# Patient Record
Sex: Male | Born: 1935 | Race: White | Hispanic: No | Marital: Married | Smoking: Former smoker
Health system: Southern US, Community
[De-identification: ages and names within clinical notes are randomized; demographics above are authoritative.]

## PROBLEM LIST (undated history)

## (undated) ENCOUNTER — Inpatient Hospital Stay: Payer: Self-pay | Admitting: Physician Assistant

## (undated) DIAGNOSIS — Z8669 Personal history of other diseases of the nervous system and sense organs: Secondary | ICD-10-CM

## (undated) DIAGNOSIS — N400 Enlarged prostate without lower urinary tract symptoms: Secondary | ICD-10-CM

## (undated) DIAGNOSIS — F419 Anxiety disorder, unspecified: Secondary | ICD-10-CM

## (undated) DIAGNOSIS — F32A Depression, unspecified: Secondary | ICD-10-CM

## (undated) DIAGNOSIS — M1990 Unspecified osteoarthritis, unspecified site: Secondary | ICD-10-CM

## (undated) DIAGNOSIS — R519 Headache, unspecified: Secondary | ICD-10-CM

## (undated) DIAGNOSIS — C801 Malignant (primary) neoplasm, unspecified: Secondary | ICD-10-CM

## (undated) DIAGNOSIS — R51 Headache: Secondary | ICD-10-CM

## (undated) DIAGNOSIS — Z973 Presence of spectacles and contact lenses: Secondary | ICD-10-CM

## (undated) DIAGNOSIS — K219 Gastro-esophageal reflux disease without esophagitis: Secondary | ICD-10-CM

## (undated) DIAGNOSIS — E785 Hyperlipidemia, unspecified: Secondary | ICD-10-CM

## (undated) DIAGNOSIS — Z8551 Personal history of malignant neoplasm of bladder: Secondary | ICD-10-CM

## (undated) DIAGNOSIS — Z8719 Personal history of other diseases of the digestive system: Secondary | ICD-10-CM

## (undated) DIAGNOSIS — Z87442 Personal history of urinary calculi: Secondary | ICD-10-CM

## (undated) DIAGNOSIS — M48061 Spinal stenosis, lumbar region without neurogenic claudication: Secondary | ICD-10-CM

## (undated) DIAGNOSIS — G8929 Other chronic pain: Secondary | ICD-10-CM

## (undated) DIAGNOSIS — E291 Testicular hypofunction: Secondary | ICD-10-CM

## (undated) DIAGNOSIS — M549 Dorsalgia, unspecified: Secondary | ICD-10-CM

## (undated) DIAGNOSIS — N200 Calculus of kidney: Secondary | ICD-10-CM

## (undated) DIAGNOSIS — D49519 Neoplasm of unspecified behavior of unspecified kidney: Secondary | ICD-10-CM

## (undated) DIAGNOSIS — F329 Major depressive disorder, single episode, unspecified: Secondary | ICD-10-CM

## (undated) DIAGNOSIS — I10 Essential (primary) hypertension: Secondary | ICD-10-CM

## (undated) DIAGNOSIS — F429 Obsessive-compulsive disorder, unspecified: Secondary | ICD-10-CM

## (undated) HISTORY — PX: COLONOSCOPY: SHX174

## (undated) HISTORY — DX: Depression, unspecified: F32.A

## (undated) HISTORY — DX: Major depressive disorder, single episode, unspecified: F32.9

## (undated) HISTORY — PX: BACK SURGERY: SHX140

## (undated) HISTORY — PX: JOINT REPLACEMENT: SHX530

## (undated) HISTORY — DX: Gastro-esophageal reflux disease without esophagitis: K21.9

## (undated) HISTORY — PX: EYE SURGERY: SHX253

## (undated) HISTORY — PX: INGUINAL HERNIA REPAIR: SUR1180

## (undated) HISTORY — DX: Anxiety disorder, unspecified: F41.9

## (undated) HISTORY — PX: CATARACT EXTRACTION W/ INTRAOCULAR LENS  IMPLANT, BILATERAL: SHX1307

## (undated) HISTORY — PX: TONSILLECTOMY: SUR1361

---

## 2001-11-11 ENCOUNTER — Ambulatory Visit (HOSPITAL_COMMUNITY): Admission: RE | Admit: 2001-11-11 | Discharge: 2001-11-11 | Payer: Self-pay | Admitting: Gastroenterology

## 2005-03-27 ENCOUNTER — Encounter: Admission: RE | Admit: 2005-03-27 | Discharge: 2005-03-27 | Payer: Self-pay | Admitting: Sports Medicine

## 2010-12-05 ENCOUNTER — Other Ambulatory Visit: Payer: Self-pay | Admitting: Physician Assistant

## 2010-12-05 ENCOUNTER — Encounter: Payer: Self-pay | Admitting: Physician Assistant

## 2010-12-05 ENCOUNTER — Other Ambulatory Visit: Payer: Self-pay | Admitting: Orthopedic Surgery

## 2010-12-05 DIAGNOSIS — E7800 Pure hypercholesterolemia, unspecified: Secondary | ICD-10-CM | POA: Insufficient documentation

## 2010-12-05 DIAGNOSIS — H3323 Serous retinal detachment, bilateral: Secondary | ICD-10-CM | POA: Insufficient documentation

## 2010-12-05 DIAGNOSIS — M1710 Unilateral primary osteoarthritis, unspecified knee: Secondary | ICD-10-CM | POA: Insufficient documentation

## 2010-12-05 DIAGNOSIS — M818 Other osteoporosis without current pathological fracture: Secondary | ICD-10-CM | POA: Insufficient documentation

## 2010-12-05 DIAGNOSIS — F419 Anxiety disorder, unspecified: Secondary | ICD-10-CM | POA: Insufficient documentation

## 2010-12-05 NOTE — H&P (Signed)
Timothy Osborn is an 77 y.o. male.   Chief Complaint: left knee pain, end stage DJD HPI: 89 yowm presents today with left knee pain that has been going on for years.  He has tried and failed cortisone shots, supartz shots, NSAIDs, and pain meds.  We discussed the risks benefits and possible complications of a left total knee replacement.  He understands and is without question.  Past Medical History  Diagnosis Date  . Depression   . GERD (gastroesophageal reflux disease)   . Anxiety   . Hypercholesterolemia     Past Surgical History  Procedure Date  . Hernia repair     Family History  Problem Relation Age of Onset  . Heart failure Father   . Heart attack Brother    Social History:  reports that he quit smoking about 24 years ago. He does not have any smokeless tobacco history on file. He reports that he drinks about 3.6 ounces of alcohol per week. He reports that he does not use illicit drugs.  Allergies: No Known Allergies  No current outpatient prescriptions on file as of 12/05/2010.   No current facility-administered medications on file as of 12/05/2010.    No results found for this or any previous visit (from the past 48 hour(s)). No results found.  Review of Systems  Constitutional: Negative for fever, chills, weight loss, malaise/fatigue and diaphoresis.  HENT: Negative for hearing loss, ear pain, nosebleeds, congestion, sore throat, neck pain, tinnitus and ear discharge.   Eyes: Negative for blurred vision, double vision, photophobia, pain, discharge and redness.  Respiratory: Negative for cough, hemoptysis, sputum production, shortness of breath, wheezing and stridor.   Cardiovascular: Negative for chest pain, palpitations, orthopnea, claudication, leg swelling and PND.  Gastrointestinal: Positive for heartburn. Negative for nausea, vomiting, abdominal pain, diarrhea, constipation, blood in stool and melena.  Genitourinary: Negative for dysuria, urgency, frequency,  hematuria and flank pain.  Musculoskeletal: Positive for joint pain. Negative for myalgias, back pain and falls.  Skin: Positive for itching. Negative for rash.  Neurological: Negative for dizziness, tingling, tremors, sensory change, speech change, focal weakness, seizures, loss of consciousness, weakness and headaches.  Endo/Heme/Allergies: Negative for environmental allergies and polydipsia. Bruises/bleeds easily.  Psychiatric/Behavioral: Positive for depression. Negative for suicidal ideas, hallucinations, memory loss and substance abuse. The patient is nervous/anxious and has insomnia.   All other systems reviewed and are negative.    There were no vitals taken for this visit. Physical Exam  Constitutional: He is oriented to person, place, and time. He appears well-developed and well-nourished.  HENT:  Head: Normocephalic and atraumatic.  Mouth/Throat: Oropharynx is clear and moist.  Eyes: Conjunctivae and EOM are normal. Pupils are equal, round, and reactive to light.  Neck: Normal range of motion. Neck supple.  Cardiovascular: Normal rate, regular rhythm, normal heart sounds and intact distal pulses.   Respiratory: Effort normal and breath sounds normal. He has no wheezes. He has no rales. He exhibits no tenderness.  GI: Soft. Bowel sounds are normal. He exhibits no distension. There is no tenderness.  Musculoskeletal:       Left knee   0-120 degrees 2+crepitus 2+synovitis no effusion ligamentously stable normal patella tracking  Neurological: He is alert and oriented to person, place, and time. He has normal reflexes.  Skin: Skin is warm and dry. No rash noted. No erythema.  Psychiatric: He has a normal mood and affect. His behavior is normal. Judgment and thought content normal.     Assessment/Plan:  End stage DJD left knee Anxiety Hypercholesterolemia Osteoporosis GERD  Plan on a Left total Knee replacement by Dr Thurston Hole 12/18/2010 at 10 am   Stephanny Tsutsui  J 12/05/2010, 4:08 PM

## 2010-12-06 ENCOUNTER — Encounter: Payer: Self-pay | Admitting: Physician Assistant

## 2010-12-06 ENCOUNTER — Other Ambulatory Visit: Payer: Self-pay | Admitting: Physician Assistant

## 2010-12-06 NOTE — H&P (Signed)
Timothy Osborn is an 76 y.o. male.   Chief Complaint: left knee end stage DJD HPI: 77 year old white male with a many year history of left knee pain.  He has tried and failed multiple cortisone shots, viscosupplementation shots, NSAIDs, and pain medication.  Past Medical History  Diagnosis Date  . Depression   . GERD (gastroesophageal reflux disease)   . Anxiety   . Hypercholesterolemia   . Detached retina, left 2012    cleared in July by Dr Dione Booze  . Detached retina, right 2008    Past Surgical History  Procedure Date  . Hernia repair     Family History  Problem Relation Age of Onset  . Heart failure Father   . Heart attack Brother    Social History:  reports that he quit smoking about 24 years ago. He does not have any smokeless tobacco history on file. He reports that he drinks about 3.6 ounces of alcohol per week. He reports that he does not use illicit drugs.  Allergies: No Known Allergies  Medications Prior to Admission  Medication Sig Dispense Refill  . aspirin 81 MG tablet Take 81 mg by mouth daily.        . calcium-vitamin D (OSCAL WITH D) 500-200 MG-UNIT per tablet Take 2 tablets by mouth daily.        Marland Kitchen co-enzyme Q-10 30 MG capsule Take 30 mg by mouth daily.        . multivitamin-iron-minerals-folic acid (CENTRUM) chewable tablet Chew 1 tablet by mouth daily.        Marland Kitchen omeprazole (PRILOSEC) 40 MG capsule Take 40 mg by mouth daily.        Marland Kitchen PARoxetine (PAXIL) 40 MG tablet Take 40 mg by mouth every morning.        . piroxicam (FELDENE) 10 MG capsule Take 10 mg by mouth daily.        . simvastatin (ZOCOR) 20 MG tablet Take 20 mg by mouth at bedtime.        . Thiamine HCl (VITAMIN B-1) 100 MG tablet Take 100 mg by mouth daily.         No current facility-administered medications on file as of 12/06/2010.    No results found for this or any previous visit (from the past 48 hour(s)). No results found.  Review of Systems  Constitutional: Negative.   HENT: Negative.   Negative for neck pain.   Eyes: Negative.   Respiratory: Negative.   Cardiovascular: Negative.   Gastrointestinal: Positive for heartburn. Negative for nausea, vomiting, abdominal pain, diarrhea, constipation, blood in stool and melena.  Genitourinary: Negative.   Musculoskeletal: Positive for joint pain. Negative for myalgias, back pain and falls.       Left knee pain  Skin: Positive for itching. Negative for rash.  Neurological: Negative.   Endo/Heme/Allergies: Negative for environmental allergies and polydipsia. Bruises/bleeds easily.  Psychiatric/Behavioral: Positive for depression. Negative for suicidal ideas, hallucinations, memory loss and substance abuse. The patient is nervous/anxious and has insomnia.     Blood pressure 156/79, pulse 85, temperature 97.8 F (36.6 C), resp. rate 12, height 5\' 11"  (1.803 m), weight 91.173 kg (201 lb), SpO2 93.00%.  Physical Exam  Constitutional: He is oriented to person, place, and time. He appears well-developed and well-nourished.  HENT:  Head: Normocephalic and atraumatic.  Nose: Nose normal.  Mouth/Throat: Oropharynx is clear and moist.  Eyes: Conjunctivae and EOM are normal. Pupils are equal, round, and reactive to light.  Neck:  Normal range of motion. Neck supple. No JVD present.  Cardiovascular: Normal rate, regular rhythm, normal heart sounds and intact distal pulses.   Respiratory: Effort normal and breath sounds normal.  GI: Soft. Bowel sounds are normal.  Musculoskeletal:       Left knee ROM-5 to 120 degrees 2+crepitus 2+synovitis.  ligamentously stable, normal patella tracking   Right knee has full range of motion 1+ crepitus, 1+synovitis and minimal pain today  Neurological: He is alert and oriented to person, place, and time. He has normal reflexes.  Skin: Skin is warm and dry.  Psychiatric: He has a normal mood and affect. His behavior is normal. Judgment and thought content normal.     Assessment Patient Active Problem List   Diagnoses  . Hypercholesterolemia  . Osteoporosis, idiopathic  . DJD (degenerative joint disease) of knee  . Detached retina, bilateral  . Anxiety   Plan: The risks, benefits, and possible complications of a left total knee replacement was discussed in detail with the patient.  The patient is without question.  Left total knee replacement by Dr Thurston Hole on 12/18/2010 at 10 am.  Admit to [facility] post operatively.  Krishav Mamone A. Gwinda Passe Physician Assistant [facility] [phone]  12/06/2010, 1:05 PM Pascal Lux 12/06/2010, 12:39 PM

## 2010-12-08 ENCOUNTER — Encounter (HOSPITAL_COMMUNITY): Payer: Self-pay | Admitting: Pharmacy Technician

## 2010-12-12 ENCOUNTER — Other Ambulatory Visit: Payer: Self-pay

## 2010-12-12 ENCOUNTER — Ambulatory Visit (HOSPITAL_COMMUNITY)
Admission: RE | Admit: 2010-12-12 | Discharge: 2010-12-12 | Disposition: A | Discharge status: Home / Self Care | Payer: Medicare Other | Source: Ambulatory Visit | Attending: Physician Assistant | Admitting: Physician Assistant

## 2010-12-12 ENCOUNTER — Encounter (HOSPITAL_COMMUNITY)
Admission: RE | Admit: 2010-12-12 | Discharge: 2010-12-12 | Disposition: A | Discharge status: Home / Self Care | Payer: Medicare Other | Source: Ambulatory Visit | Attending: Orthopedic Surgery | Admitting: Orthopedic Surgery

## 2010-12-12 ENCOUNTER — Other Ambulatory Visit (HOSPITAL_COMMUNITY): Payer: Self-pay | Admitting: Physician Assistant

## 2010-12-12 ENCOUNTER — Encounter (HOSPITAL_COMMUNITY): Payer: Self-pay

## 2010-12-12 DIAGNOSIS — Z01811 Encounter for preprocedural respiratory examination: Secondary | ICD-10-CM

## 2010-12-12 DIAGNOSIS — M25569 Pain in unspecified knee: Secondary | ICD-10-CM | POA: Insufficient documentation

## 2010-12-12 DIAGNOSIS — Z01812 Encounter for preprocedural laboratory examination: Secondary | ICD-10-CM | POA: Insufficient documentation

## 2010-12-12 DIAGNOSIS — Z01818 Encounter for other preprocedural examination: Secondary | ICD-10-CM | POA: Insufficient documentation

## 2010-12-12 DIAGNOSIS — Z01810 Encounter for preprocedural cardiovascular examination: Secondary | ICD-10-CM | POA: Insufficient documentation

## 2010-12-12 HISTORY — DX: Obsessive-compulsive disorder, unspecified: F42.9

## 2010-12-12 HISTORY — DX: Unspecified osteoarthritis, unspecified site: M19.90

## 2010-12-12 LAB — URINALYSIS, ROUTINE W REFLEX MICROSCOPIC
Glucose, UA: NEGATIVE mg/dL
Ketones, ur: NEGATIVE mg/dL
Protein, ur: NEGATIVE mg/dL
pH: 5.5 (ref 5.0–8.0)

## 2010-12-12 LAB — CBC
HCT: 45.7 % (ref 39.0–52.0)
MCV: 94.4 fL (ref 78.0–100.0)
RDW: 13.1 % (ref 11.5–15.5)
WBC: 7.7 10*3/uL (ref 4.0–10.5)

## 2010-12-12 LAB — COMPREHENSIVE METABOLIC PANEL
Albumin: 3.4 g/dL — ABNORMAL LOW (ref 3.5–5.2)
BUN: 17 mg/dL (ref 6–23)
CO2: 27 mEq/L (ref 19–32)
Chloride: 106 mEq/L (ref 96–112)
Creatinine, Ser: 0.94 mg/dL (ref 0.50–1.35)
GFR calc Af Amer: 90 mL/min — ABNORMAL LOW (ref >90)
GFR calc non Af Amer: 77 mL/min — ABNORMAL LOW (ref >90)
Glucose, Bld: 90 mg/dL (ref 70–99)
Total Bilirubin: 0.4 mg/dL (ref 0.3–1.2)

## 2010-12-12 LAB — DIFFERENTIAL
Basophils Absolute: 0 10*3/uL (ref 0.0–0.1)
Basophils Relative: 1 % (ref 0–1)
Neutro Abs: 5 10*3/uL (ref 1.7–7.7)
Neutrophils Relative %: 65 % (ref 43–77)

## 2010-12-12 LAB — URINE MICROSCOPIC-ADD ON

## 2010-12-12 LAB — TYPE AND SCREEN: ABO/RH(D): O POS

## 2010-12-12 LAB — PROTIME-INR: INR: 0.96 (ref 0.00–1.49)

## 2010-12-12 NOTE — Pre-Procedure Instructions (Signed)
[name]  12/12/2010   Your procedure is scheduled on: Monday, November 19th                                                           Report to [facility] at 7:45 Am  Call this number if you have problems the morning of surgery: [phone]   Remember:   Do not eat food:After Midnight.  Do not drink clear liquids: 4 Hours before arrival  (3:45 Am...may have             Tea, soda, black coffee, apple or grape juice, broth .  Take these medicines the morning of surgery with A SIP OF WATER: Omeprazole &              Paxil  Do not wear jewelry, make-up or nail polish.   Do not wear lotions, powders, or perfumes. You may wear deodorant.   Do not shave 48 hours prior to surgery.              DO NOT BRING VALUABLES--RINGS, WATCHES, NECKLACES,             CELL PHONES, MONEY TO THE HOSPITAL   Contacts, dentures or bridgework may not be worn into surgery.  Leave suitcase in the car. After surgery it may be brought to your room.   For patients admitted to the hospital, checkout time is 11:00 AM the day of discharge.   Patients discharged the day of surgery will not be allowed to drive home.  Name and phone number of your driver:  Vernon Maish -- spouse  [phone]                                 Special Instructions: CHG Shower Use Special Wash: 1/2 bottle night before surgery and 1/2 bottle morning of surgery.   Please read over the following fact sheets that you were given: Pain Booklet, MRSA Information and Surgical Site Infection Prevention

## 2010-12-12 NOTE — Pre-Procedure Instructions (Deleted)
20 PURL CLAYTOR  12/12/2010   Your procedure is scheduled on: November 19th  Monday   Report to [facility] at    7:45  AM .  Call this number if you have problems the morning of surgery: [phone]   Remember:   Do not eat food:After Midnight. Sunday  Do not drink clear liquids: 4 Hours before arrival 3:45 Am.   Take these medicines the morning of surgery with A SIP OF WATER: Omeprazole             And Paxil   Do not wear jewelry, make-up or nail polish.   Do not wear lotions, powders, or perfumes. You may wear deodorant.   Do not shave 48 hours prior to surgery.   Do not bring valuables to the hospital, no watches, rings, necklaces,             Money or cell phones   Contacts, dentures or bridgework may not be worn into surgery.   Leave suitcase in the car. After surgery it may be brought to your room.  For patients admitted to the hospital, checkout time is 11:00 AM the day of discharge.   Patients discharged the day of surgery will not be allowed to drive home.  Name and phone number of your driver: Jamy Cleckler -- spouse  [phone]                Special Instructions: CHG Shower Use Special Wash: 1/2 bottle night before surgery and 1/2 bottle morning of surgery.   Please read over the following fact sheets that you were given: Pain Booklet, MRSA Information and Surgical Site Infection Prevention

## 2010-12-12 NOTE — Progress Notes (Signed)
PATIENT, WHEN HE HAD LEFT HERNIA REPAIR (IN 1952-53), HE WAS HOSPITALIZED FOR 5-6 DAYS D/T  URINARY RETENTION......

## 2010-12-13 LAB — URINE CULTURE
Colony Count: NO GROWTH
Culture  Setup Time: 201211131225
Culture: NO GROWTH

## 2010-12-14 NOTE — Consult Note (Signed)
Anesthesia:  77 year old male for left TKA.  Hx of GERD, anxiety, OCD, former smoker (quit '88), depression, and hyperlipidemia.  I was asked to review his preoperative EKG showing LAD and possible prior septal infarct.  He has no known hx of DM or CAD.  I spoke with Mr. Hofferber.  He denies CP, SOB, and edema.  He continues to play the saxaphone.  His activity is somewhat limited secondary to knee pain, but he is still able to walk his dog 1/2 mile daily without CV symptoms.  Many years ago the patient had been told that he had a murmur, so he did undergo an echo 4-5 years ago to further evaluate.  He was told that it was okay.  This was done by his previous PCP.  He now sees Dr. Nehemiah Settle.  I did fax a request for his last OV, prior EKG, and echo report if available.  He is asymptomatic and with reasonable exercise tolerance, so at this point I feel he should be okay to proceed.

## 2010-12-14 NOTE — Progress Notes (Signed)
Chart to Shonna Chock PA to review EKG.

## 2010-12-15 NOTE — Consult Note (Signed)
Anesthesia:  See my previous consult note.  I did receive records from Timothy Osborn office.  Timothy Osborn did have an echocardiogram in 2005 showing normal LV systolic function with EF 65%, trivial MR/TR, and LA at upper limits of normal.  He had an EKG done at Timothy Osborn office in 09/06/10 showing NSR.  V1 appears similar to 12/12/10 EKG, but low r in V2-3 is new.  As per my last note, the patient has reasonable exercise tolerance and is asymptomatic.  I also discussed this with Timothy Osborn who still felt comfortable clearing Timothy Osborn for surgery, but did recommend repeating the EKG on arrival to see if lead placement may be a factor.  Updated Timothy Osborn who agrees with this plan.

## 2010-12-17 MED ORDER — CEFAZOLIN SODIUM-DEXTROSE 2-3 GM-% IV SOLR
2.0000 g | INTRAVENOUS | Status: AC
  Administered 2010-12-18: 2 g via INTRAVENOUS
  Filled 2010-12-17: qty 50

## 2010-12-18 ENCOUNTER — Encounter (HOSPITAL_COMMUNITY): Payer: Self-pay | Admitting: *Deleted

## 2010-12-18 ENCOUNTER — Encounter (HOSPITAL_COMMUNITY): Payer: Self-pay | Admitting: Vascular Surgery

## 2010-12-18 ENCOUNTER — Inpatient Hospital Stay (HOSPITAL_COMMUNITY)
Admission: RE | Admit: 2010-12-18 | Discharge: 2010-12-20 | DRG: 470 | Disposition: A | Discharge status: Home Health | Payer: Medicare Other | Source: Ambulatory Visit | Attending: Orthopedic Surgery | Admitting: Orthopedic Surgery

## 2010-12-18 ENCOUNTER — Encounter (HOSPITAL_COMMUNITY)
Admission: RE | Disposition: A | Discharge status: Home Health | Payer: Self-pay | Source: Ambulatory Visit | Attending: Orthopedic Surgery

## 2010-12-18 ENCOUNTER — Other Ambulatory Visit: Payer: Self-pay

## 2010-12-18 ENCOUNTER — Inpatient Hospital Stay (HOSPITAL_COMMUNITY): Payer: Medicare Other | Admitting: Vascular Surgery

## 2010-12-18 DIAGNOSIS — K219 Gastro-esophageal reflux disease without esophagitis: Secondary | ICD-10-CM | POA: Diagnosis present

## 2010-12-18 DIAGNOSIS — F411 Generalized anxiety disorder: Secondary | ICD-10-CM | POA: Diagnosis present

## 2010-12-18 DIAGNOSIS — M810 Age-related osteoporosis without current pathological fracture: Secondary | ICD-10-CM | POA: Diagnosis present

## 2010-12-18 DIAGNOSIS — E7800 Pure hypercholesterolemia, unspecified: Secondary | ICD-10-CM | POA: Diagnosis present

## 2010-12-18 DIAGNOSIS — F419 Anxiety disorder, unspecified: Secondary | ICD-10-CM | POA: Insufficient documentation

## 2010-12-18 DIAGNOSIS — D62 Acute posthemorrhagic anemia: Secondary | ICD-10-CM | POA: Diagnosis not present

## 2010-12-18 DIAGNOSIS — G4700 Insomnia, unspecified: Secondary | ICD-10-CM | POA: Diagnosis present

## 2010-12-18 DIAGNOSIS — M1710 Unilateral primary osteoarthritis, unspecified knee: Principal | ICD-10-CM | POA: Diagnosis present

## 2010-12-18 DIAGNOSIS — Z23 Encounter for immunization: Secondary | ICD-10-CM

## 2010-12-18 DIAGNOSIS — Z79899 Other long term (current) drug therapy: Secondary | ICD-10-CM

## 2010-12-18 HISTORY — PX: TOTAL KNEE ARTHROPLASTY: SHX125

## 2010-12-18 SURGERY — ARTHROPLASTY, KNEE, TOTAL
Anesthesia: Regional | Site: Knee | Laterality: Left | Wound class: Clean

## 2010-12-18 MED ORDER — ONDANSETRON HCL 4 MG PO TABS: 4.0000 mg | ORAL_TABLET | Freq: Four times a day (QID) | ORAL | Status: DC | PRN

## 2010-12-18 MED ORDER — THERA M PLUS PO TABS
1.0000 | ORAL_TABLET | Freq: Every day | ORAL | Status: DC
  Administered 2010-12-19 – 2010-12-20 (×2): 1 via ORAL
  Filled 2010-12-18 (×2): qty 1

## 2010-12-18 MED ORDER — FLEET ENEMA 7-19 GM/118ML RE ENEM: 1.0000 | ENEMA | Freq: Every day | RECTAL | Status: DC | PRN

## 2010-12-18 MED ORDER — ENOXAPARIN SODIUM 30 MG/0.3ML ~~LOC~~ SOLN
30.0000 mg | Freq: Two times a day (BID) | SUBCUTANEOUS | Status: DC
  Administered 2010-12-18 – 2010-12-20 (×4): 30 mg via SUBCUTANEOUS
  Filled 2010-12-18 (×7): qty 0.3

## 2010-12-18 MED ORDER — CEFAZOLIN SODIUM-DEXTROSE 2-3 GM-% IV SOLR
2.0000 g | Freq: Four times a day (QID) | INTRAVENOUS | Status: AC
  Administered 2010-12-18 – 2010-12-19 (×3): 2 g via INTRAVENOUS
  Filled 2010-12-18 (×4): qty 50

## 2010-12-18 MED ORDER — ONDANSETRON HCL 4 MG/2ML IJ SOLN
INTRAMUSCULAR | Status: DC | PRN
  Administered 2010-12-18: 4 mg via INTRAVENOUS

## 2010-12-18 MED ORDER — MEPERIDINE HCL 25 MG/ML IJ SOLN: 6.2500 mg | INTRAMUSCULAR | Status: DC | PRN

## 2010-12-18 MED ORDER — BISACODYL 5 MG PO TBEC: 10.0000 mg | DELAYED_RELEASE_TABLET | Freq: Every day | ORAL | Status: DC | PRN

## 2010-12-18 MED ORDER — HYDROCODONE-ACETAMINOPHEN 5-325 MG PO TABS
1.0000 | ORAL_TABLET | ORAL | Status: DC | PRN
  Administered 2010-12-18 – 2010-12-19 (×3): 2 via ORAL
  Administered 2010-12-19: 1 via ORAL
  Administered 2010-12-19: 2 via ORAL
  Administered 2010-12-20: 1 via ORAL
  Administered 2010-12-20: 2 via ORAL
  Administered 2010-12-20: 1 via ORAL
  Filled 2010-12-18: qty 1
  Filled 2010-12-18: qty 2
  Filled 2010-12-18: qty 1
  Filled 2010-12-18 (×2): qty 2
  Filled 2010-12-18: qty 1
  Filled 2010-12-18 (×2): qty 2

## 2010-12-18 MED ORDER — ZOLPIDEM TARTRATE 5 MG PO TABS: 5.0000 mg | ORAL_TABLET | Freq: Every evening | ORAL | Status: DC | PRN

## 2010-12-18 MED ORDER — PAROXETINE HCL 20 MG PO TABS
40.0000 mg | ORAL_TABLET | ORAL | Status: DC
  Administered 2010-12-19 – 2010-12-20 (×2): 40 mg via ORAL
  Filled 2010-12-18 (×3): qty 2

## 2010-12-18 MED ORDER — LACTATED RINGERS IV SOLN: INTRAVENOUS | Status: DC

## 2010-12-18 MED ORDER — MENTHOL 3 MG MT LOZG: 1.0000 | LOZENGE | OROMUCOSAL | Status: DC | PRN

## 2010-12-18 MED ORDER — METOCLOPRAMIDE HCL 10 MG PO TABS: 5.0000 mg | ORAL_TABLET | Freq: Three times a day (TID) | ORAL | Status: DC | PRN

## 2010-12-18 MED ORDER — CELECOXIB 200 MG PO CAPS
200.0000 mg | ORAL_CAPSULE | Freq: Every day | ORAL | Status: DC
  Administered 2010-12-19 – 2010-12-20 (×2): 200 mg via ORAL
  Filled 2010-12-18 (×2): qty 1

## 2010-12-18 MED ORDER — SIMVASTATIN 20 MG PO TABS
20.0000 mg | ORAL_TABLET | Freq: Every day | ORAL | Status: DC
  Administered 2010-12-18 – 2010-12-19 (×2): 20 mg via ORAL
  Filled 2010-12-18 (×3): qty 1

## 2010-12-18 MED ORDER — MIDAZOLAM HCL 2 MG/2ML IJ SOLN: 1.0000 mg | INTRAMUSCULAR | Status: DC | PRN

## 2010-12-18 MED ORDER — MIDAZOLAM HCL 5 MG/5ML IJ SOLN
INTRAMUSCULAR | Status: DC | PRN
  Administered 2010-12-18: 2 mg via INTRAVENOUS

## 2010-12-18 MED ORDER — POLYETHYLENE GLYCOL 3350 17 G PO PACK
17.0000 g | PACK | Freq: Every day | ORAL | Status: DC | PRN
  Filled 2010-12-18: qty 1

## 2010-12-18 MED ORDER — BUPIVACAINE-EPINEPHRINE PF 0.5-1:200000 % IJ SOLN
INTRAMUSCULAR | Status: DC | PRN
  Administered 2010-12-18: 30 mL

## 2010-12-18 MED ORDER — MAGNESIUM HYDROXIDE 400 MG/5ML PO SUSP
30.0000 mL | Freq: Two times a day (BID) | ORAL | Status: DC | PRN
  Filled 2010-12-18: qty 30

## 2010-12-18 MED ORDER — ONDANSETRON HCL 4 MG/2ML IJ SOLN: 4.0000 mg | Freq: Four times a day (QID) | INTRAMUSCULAR | Status: DC | PRN

## 2010-12-18 MED ORDER — LIDOCAINE HCL (CARDIAC) 20 MG/ML IV SOLN
INTRAVENOUS | Status: DC | PRN
  Administered 2010-12-18: 100 mg via INTRAVENOUS

## 2010-12-18 MED ORDER — PANTOPRAZOLE SODIUM 40 MG PO TBEC
80.0000 mg | DELAYED_RELEASE_TABLET | Freq: Every day | ORAL | Status: DC
  Administered 2010-12-18 – 2010-12-20 (×3): 80 mg via ORAL
  Filled 2010-12-18: qty 2
  Filled 2010-12-18: qty 1
  Filled 2010-12-18: qty 2
  Filled 2010-12-18: qty 1
  Filled 2010-12-18: qty 2

## 2010-12-18 MED ORDER — FENTANYL CITRATE 0.05 MG/ML IJ SOLN
INTRAMUSCULAR | Status: DC | PRN
  Administered 2010-12-18 (×4): 50 ug via INTRAVENOUS
  Administered 2010-12-18: 100 ug via INTRAVENOUS
  Administered 2010-12-18: 25 ug via INTRAVENOUS

## 2010-12-18 MED ORDER — CEFUROXIME SODIUM 1.5 G IJ SOLR
INTRAMUSCULAR | Status: DC | PRN
  Administered 2010-12-18: 1.5 g

## 2010-12-18 MED ORDER — HYDROMORPHONE HCL PF 1 MG/ML IJ SOLN
0.5000 mg | INTRAMUSCULAR | Status: DC | PRN
Start: 2010-12-18 — End: 2010-12-20
  Administered 2010-12-19: 1 mg via INTRAVENOUS
  Filled 2010-12-18: qty 1

## 2010-12-18 MED ORDER — DIPHENHYDRAMINE HCL 12.5 MG/5ML PO ELIX
12.5000 mg | ORAL_SOLUTION | ORAL | Status: DC | PRN
  Filled 2010-12-18: qty 10

## 2010-12-18 MED ORDER — FENTANYL CITRATE 0.05 MG/ML IJ SOLN: 50.0000 ug | INTRAMUSCULAR | Status: DC | PRN

## 2010-12-18 MED ORDER — BISACODYL 10 MG RE SUPP
10.0000 mg | Freq: Every day | RECTAL | Status: DC | PRN
  Administered 2010-12-20: 10 mg via RECTAL
  Filled 2010-12-18: qty 1

## 2010-12-18 MED ORDER — SODIUM CHLORIDE 0.9 % IR SOLN
Status: DC | PRN
  Administered 2010-12-18: 1000 mL
  Administered 2010-12-18: 3000 mL

## 2010-12-18 MED ORDER — METOCLOPRAMIDE HCL 5 MG/ML IJ SOLN
5.0000 mg | Freq: Three times a day (TID) | INTRAMUSCULAR | Status: DC | PRN
  Filled 2010-12-18: qty 2

## 2010-12-18 MED ORDER — PROPOFOL 10 MG/ML IV EMUL
INTRAVENOUS | Status: DC | PRN
  Administered 2010-12-18: 100 mg via INTRAVENOUS

## 2010-12-18 MED ORDER — HYDROMORPHONE HCL PF 1 MG/ML IJ SOLN: 0.2500 mg | INTRAMUSCULAR | Status: DC | PRN

## 2010-12-18 MED ORDER — EPHEDRINE SULFATE 50 MG/ML IJ SOLN
INTRAMUSCULAR | Status: DC | PRN
  Administered 2010-12-18 (×2): 10 mg via INTRAVENOUS

## 2010-12-18 MED ORDER — ACETAMINOPHEN 10 MG/ML IV SOLN
INTRAVENOUS | Status: DC | PRN
  Administered 2010-12-18: 1000 mg via INTRAVENOUS

## 2010-12-18 MED ORDER — OXYCODONE HCL 5 MG PO TABS: 5.0000 mg | ORAL_TABLET | ORAL | Status: DC | PRN

## 2010-12-18 MED ORDER — PHENOL 1.4 % MT LIQD
1.0000 | OROMUCOSAL | Status: DC | PRN
  Filled 2010-12-18: qty 177

## 2010-12-18 MED ORDER — LACTATED RINGERS IV SOLN
INTRAVENOUS | Status: DC
  Administered 2010-12-18: 09:00:00 via INTRAVENOUS

## 2010-12-18 MED ORDER — LACTATED RINGERS IV SOLN
INTRAVENOUS | Status: DC | PRN
  Administered 2010-12-18 (×2): via INTRAVENOUS

## 2010-12-18 MED ORDER — ONDANSETRON HCL 4 MG/2ML IJ SOLN: 4.0000 mg | Freq: Once | INTRAMUSCULAR | Status: DC | PRN

## 2010-12-18 MED ORDER — POTASSIUM CHLORIDE IN NACL 20-0.9 MEQ/L-% IV SOLN
INTRAVENOUS | Status: DC
  Administered 2010-12-18 – 2010-12-19 (×2): via INTRAVENOUS
  Filled 2010-12-18 (×6): qty 1000

## 2010-12-18 MED ORDER — VITAMIN B-1 100 MG PO TABS
100.0000 mg | ORAL_TABLET | Freq: Every day | ORAL | Status: DC
  Administered 2010-12-19 – 2010-12-20 (×2): 100 mg via ORAL
  Filled 2010-12-18 (×2): qty 1

## 2010-12-18 MED ORDER — DOCUSATE SODIUM 100 MG PO CAPS
100.0000 mg | ORAL_CAPSULE | Freq: Two times a day (BID) | ORAL | Status: DC
  Administered 2010-12-18 – 2010-12-20 (×4): 100 mg via ORAL
  Filled 2010-12-18 (×5): qty 1

## 2010-12-18 MED ORDER — ACETAMINOPHEN 650 MG RE SUPP: 650.0000 mg | Freq: Four times a day (QID) | RECTAL | Status: DC | PRN

## 2010-12-18 MED ORDER — CALCIUM CARBONATE-VITAMIN D 500-200 MG-UNIT PO TABS
2.0000 | ORAL_TABLET | Freq: Every day | ORAL | Status: DC
  Administered 2010-12-19 – 2010-12-20 (×2): via ORAL
  Filled 2010-12-18 (×2): qty 2

## 2010-12-18 MED ORDER — ACETAMINOPHEN 325 MG PO TABS: 650.0000 mg | ORAL_TABLET | Freq: Four times a day (QID) | ORAL | Status: DC | PRN

## 2010-12-18 MED ORDER — OXYCODONE-ACETAMINOPHEN 5-325 MG PO TABS: 1.0000 | ORAL_TABLET | ORAL | Status: DC | PRN

## 2010-12-18 SURGICAL SUPPLY — 80 items
AUTOTRANSFUSION W/QD PVC DRAIN (AUTOTRANSFUSION) IMPLANT
BANDAGE ESMARK 6X9 LF (GAUZE/BANDAGES/DRESSINGS) ×1 IMPLANT
BLADE SAGITTAL 25.0X1.19X90 (BLADE) ×2 IMPLANT
BLADE SAW SGTL 11.0X1.19X90.0M (BLADE) IMPLANT
BLADE SAW SGTL 13.0X1.19X90.0M (BLADE) ×2 IMPLANT
BLADE SURG 10 STRL SS (BLADE) ×4 IMPLANT
BNDG CMPR 9X6 STRL LF SNTH (GAUZE/BANDAGES/DRESSINGS) ×1
BNDG CMPR MED 10X6 ELC LF (GAUZE/BANDAGES/DRESSINGS) ×1
BNDG CMPR MED 15X6 ELC VLCR LF (GAUZE/BANDAGES/DRESSINGS) ×1
BNDG ELASTIC 6X10 VLCR STRL LF (GAUZE/BANDAGES/DRESSINGS) ×1 IMPLANT
BNDG ELASTIC 6X15 VLCR STRL LF (GAUZE/BANDAGES/DRESSINGS) ×2 IMPLANT
BNDG ESMARK 6X9 LF (GAUZE/BANDAGES/DRESSINGS) ×2
BOWL SMART MIX CTS (DISPOSABLE) ×2 IMPLANT
CEMENT HV SMART SET (Cement) ×4 IMPLANT
CLOSURE STERI STRIP 1/2 X4 (GAUZE/BANDAGES/DRESSINGS) ×1 IMPLANT
CLOTH BEACON ORANGE TIMEOUT ST (SAFETY) ×2 IMPLANT
COVER BACK TABLE 24X17X13 BIG (DRAPES) IMPLANT
COVER PROBE W GEL 5X96 (DRAPES) ×2 IMPLANT
COVER SURGICAL LIGHT HANDLE (MISCELLANEOUS) ×2 IMPLANT
CUFF TOURNIQUET SINGLE 34IN LL (TOURNIQUET CUFF) ×2 IMPLANT
CUFF TOURNIQUET SINGLE 44IN (TOURNIQUET CUFF) IMPLANT
DRAPE EXTREMITY T 121X128X90 (DRAPE) ×2 IMPLANT
DRAPE INCISE IOBAN 66X45 STRL (DRAPES) ×2 IMPLANT
DRAPE PROXIMA HALF (DRAPES) ×2 IMPLANT
DRAPE U-SHAPE 47X51 STRL (DRAPES) ×2 IMPLANT
DRSG ADAPTIC 3X8 NADH LF (GAUZE/BANDAGES/DRESSINGS) ×2 IMPLANT
DRSG PAD ABDOMINAL 8X10 ST (GAUZE/BANDAGES/DRESSINGS) ×3 IMPLANT
DURAPREP 26ML APPLICATOR (WOUND CARE) ×2 IMPLANT
ELECT CAUTERY BLADE 6.4 (BLADE) ×2 IMPLANT
ELECT REM PT RETURN 9FT ADLT (ELECTROSURGICAL) ×2
ELECTRODE REM PT RTRN 9FT ADLT (ELECTROSURGICAL) ×1 IMPLANT
EVACUATOR 1/8 PVC DRAIN (DRAIN) ×1 IMPLANT
FACESHIELD LNG OPTICON STERILE (SAFETY) ×3 IMPLANT
GLOVE BIO SURGEON STRL SZ7 (GLOVE) ×2 IMPLANT
GLOVE BIOGEL PI IND STRL 7.0 (GLOVE) ×1 IMPLANT
GLOVE BIOGEL PI IND STRL 7.5 (GLOVE) ×1 IMPLANT
GLOVE BIOGEL PI INDICATOR 7.0 (GLOVE) ×1
GLOVE BIOGEL PI INDICATOR 7.5 (GLOVE) ×1
GLOVE BIOGEL PI ORTHO PRO SZ7 (GLOVE) ×1
GLOVE ECLIPSE 8.5 STRL (GLOVE) ×1 IMPLANT
GLOVE PI ORTHO PRO STRL SZ7 (GLOVE) IMPLANT
GLOVE SS BIOGEL STRL SZ 7.5 (GLOVE) ×1 IMPLANT
GLOVE SUPERSENSE BIOGEL SZ 7.5 (GLOVE) ×2
GLOVE SURG SS PI 7.0 STRL IVOR (GLOVE) ×1 IMPLANT
GLOVE SURG SS PI 8.5 STRL IVOR (GLOVE) ×1
GLOVE SURG SS PI 8.5 STRL STRW (GLOVE) IMPLANT
GOWN PREVENTION PLUS XLARGE (GOWN DISPOSABLE) ×4 IMPLANT
GOWN PREVENTION PLUS XXLARGE (GOWN DISPOSABLE) ×1 IMPLANT
GOWN STRL NON-REIN LRG LVL3 (GOWN DISPOSABLE) ×3 IMPLANT
GOWN STRL REIN XL XLG (GOWN DISPOSABLE) ×1 IMPLANT
HANDPIECE INTERPULSE COAX TIP (DISPOSABLE) ×2
HOOD PEEL AWAY FACE SHEILD DIS (HOOD) ×4 IMPLANT
IMMOBILIZER KNEE 22 UNIV (SOFTGOODS) ×1 IMPLANT
KIT BASIN OR (CUSTOM PROCEDURE TRAY) ×2 IMPLANT
KIT ROOM TURNOVER OR (KITS) ×2 IMPLANT
MANIFOLD NEPTUNE II (INSTRUMENTS) ×2 IMPLANT
NDL 18GX1X1/2 (RX/OR ONLY) (NEEDLE) ×1 IMPLANT
NEEDLE 18GX1X1/2 (RX/OR ONLY) (NEEDLE) ×2 IMPLANT
NS IRRIG 1000ML POUR BTL (IV SOLUTION) ×2 IMPLANT
PACK TOTAL JOINT (CUSTOM PROCEDURE TRAY) ×2 IMPLANT
PAD ARMBOARD 7.5X6 YLW CONV (MISCELLANEOUS) ×2 IMPLANT
PAD CAST 4YDX4 CTTN HI CHSV (CAST SUPPLIES) ×1 IMPLANT
PADDING CAST COTTON 4X4 STRL (CAST SUPPLIES) ×2
PADDING CAST COTTON 6X4 STRL (CAST SUPPLIES) ×2 IMPLANT
POSITIONER HEAD PRONE TRACH (MISCELLANEOUS) ×2 IMPLANT
SET HNDPC FAN SPRY TIP SCT (DISPOSABLE) ×1 IMPLANT
SPONGE GAUZE 4X4 12PLY (GAUZE/BANDAGES/DRESSINGS) ×2 IMPLANT
STRIP CLOSURE SKIN 1/2X4 (GAUZE/BANDAGES/DRESSINGS) ×2 IMPLANT
SUCTION FRAZIER TIP 10 FR DISP (SUCTIONS) ×2 IMPLANT
SUT ETHIBOND NAB CT1 #1 30IN (SUTURE) ×5 IMPLANT
SUT MNCRL AB 3-0 PS2 18 (SUTURE) ×3 IMPLANT
SUT VIC AB 0 CT1 27 (SUTURE) ×4
SUT VIC AB 0 CT1 27XBRD ANBCTR (SUTURE) ×2 IMPLANT
SUT VIC AB 2-0 CT1 27 (SUTURE) ×4
SUT VIC AB 2-0 CT1 TAPERPNT 27 (SUTURE) ×2 IMPLANT
SYR 30ML SLIP (SYRINGE) ×2 IMPLANT
TOWEL OR 17X24 6PK STRL BLUE (TOWEL DISPOSABLE) ×2 IMPLANT
TOWEL OR 17X26 10 PK STRL BLUE (TOWEL DISPOSABLE) ×2 IMPLANT
TRAY FOLEY CATH 14FR (SET/KITS/TRAYS/PACK) ×2 IMPLANT
WATER STERILE IRR 1000ML POUR (IV SOLUTION) ×6 IMPLANT

## 2010-12-18 NOTE — Plan of Care (Signed)
Problem: Consults Goal: Diagnosis- Total Joint Replacement Outcome: Completed/Met Date Met:  12/18/10 Primary Total Knee     

## 2010-12-18 NOTE — Anesthesia Preprocedure Evaluation (Addendum)
Anesthesia Evaluation  Patient identified by MRN, date of birth, ID band Patient awake    Reviewed: Allergy & Precautions, H&P , NPO status , Patient's Chart, lab work & pertinent test results  Airway       Dental   Pulmonary          Cardiovascular     Neuro/Psych PSYCHIATRIC DISORDERS Anxiety Depression    GI/Hepatic GERD-  Medicated and Controlled,  Endo/Other    Renal/GU      Musculoskeletal   Abdominal   Peds  Hematology   Anesthesia Other Findings   Reproductive/Obstetrics                          Anesthesia Physical Anesthesia Plan  ASA: II  Anesthesia Plan: General   Post-op Pain Management:    Induction: Intravenous  Airway Management Planned: LMA  Additional Equipment:   Intra-op Plan:   Post-operative Plan:   Informed Consent: I have reviewed the patients History and Physical, chart, labs and discussed the procedure including the risks, benefits and alternatives for the proposed anesthesia with the patient or authorized representative who has indicated his/her understanding and acceptance.   Dental advisory given  Plan Discussed with: CRNA and Surgeon  Anesthesia Plan Comments:        Anesthesia Quick Evaluation

## 2010-12-18 NOTE — Preoperative (Signed)
Beta Blockers   Reason not to administer Beta Blockers:Not Applicable 

## 2010-12-18 NOTE — Anesthesia Procedure Notes (Addendum)
Anesthesia Regional Block:  Femoral nerve block  Pre-Anesthetic Checklist: ,, timeout performed, Correct Patient, Correct Site, Correct Laterality, Correct Procedure,, site marked, risks and benefits discussed, Surgical consent,  Pre-op evaluation,  At surgeon's request and post-op pain management  Laterality: Left  Prep: chloraprep       Needles:  Injection technique: Single-shot  Needle Type: Echogenic Stimulator Needle     Needle Length: 5cm 5 cm Needle Gauge: 21    Additional Needles:  Procedures: nerve stimulator Femoral nerve block  Nerve Stimulator or Paresthesia:  Response: Quadriceps muscle contraction, 0.4 mA,   Additional Responses:   Narrative:  Start time: 12/18/2010 8:35 AM End time: 12/18/2010 8:45 AM Injection made incrementally with aspirations every 5 mL.  Performed by: Personally  Anesthesiologist: Arta Bruce MD  Additional Notes: Functioning IV was confirmed and monitors were applied.  A 90mm 21ga Arrow echogenic stimulator needle was used. Sterile prep and drape,hand hygiene and sterile gloves were used.  Negative aspiration and negative test dose prior to incremental administration of local anesthetic. The patient tolerated the procedure well.    Femoral nerve block Procedure Name: LMA Insertion Date/Time: 12/18/2010 9:24 AM Performed by: Malachi Pro Pre-anesthesia Checklist: Patient identified, Emergency Drugs available, Suction available, Patient being monitored and Timeout performed Patient Re-evaluated:Patient Re-evaluated prior to inductionOxygen Delivery Method: Circle System Utilized Preoxygenation: Pre-oxygenation with 100% oxygen Intubation Type: IV induction LMA: LMA with gastric port inserted LMA Size: 5.0 Number of attempts: 1 Placement Confirmation: positive ETCO2 and breath sounds checked- equal and bilateral Dental Injury: Teeth and Oropharynx as per pre-operative assessment

## 2010-12-18 NOTE — H&P (View-Only) (Signed)
Timothy Osborn is an 77 y.o. male.   Chief Complaint: left knee end stage DJD HPI: 77 year old white male with a many year history of left knee pain.  He has tried and failed multiple cortisone shots, viscosupplementation shots, NSAIDs, and pain medication.  Past Medical History  Diagnosis Date  . Depression   . GERD (gastroesophageal reflux disease)   . Anxiety   . Hypercholesterolemia   . Detached retina, left 2012    cleared in July by Dr Groat  . Detached retina, right 2008    Past Surgical History  Procedure Date  . Hernia repair     Family History  Problem Relation Age of Onset  . Heart failure Father   . Heart attack Brother    Social History:  reports that he quit smoking about 24 years ago. He does not have any smokeless tobacco history on file. He reports that he drinks about 3.6 ounces of alcohol per week. He reports that he does not use illicit drugs.  Allergies: No Known Allergies  Medications Prior to Admission  Medication Sig Dispense Refill  . aspirin 81 MG tablet Take 81 mg by mouth daily.        . calcium-vitamin D (OSCAL WITH D) 500-200 MG-UNIT per tablet Take 2 tablets by mouth daily.        . co-enzyme Q-10 30 MG capsule Take 30 mg by mouth daily.        . multivitamin-iron-minerals-folic acid (CENTRUM) chewable tablet Chew 1 tablet by mouth daily.        . omeprazole (PRILOSEC) 40 MG capsule Take 40 mg by mouth daily.        . PARoxetine (PAXIL) 40 MG tablet Take 40 mg by mouth every morning.        . piroxicam (FELDENE) 10 MG capsule Take 10 mg by mouth daily.        . simvastatin (ZOCOR) 20 MG tablet Take 20 mg by mouth at bedtime.        . Thiamine HCl (VITAMIN B-1) 100 MG tablet Take 100 mg by mouth daily.         No current facility-administered medications on file as of 12/06/2010.    No results found for this or any previous visit (from the past 48 hour(s)). No results found.  Review of Systems  Constitutional: Negative.   HENT: Negative.   Negative for neck pain.   Eyes: Negative.   Respiratory: Negative.   Cardiovascular: Negative.   Gastrointestinal: Positive for heartburn. Negative for nausea, vomiting, abdominal pain, diarrhea, constipation, blood in stool and melena.  Genitourinary: Negative.   Musculoskeletal: Positive for joint pain. Negative for myalgias, back pain and falls.       Left knee pain  Skin: Positive for itching. Negative for rash.  Neurological: Negative.   Endo/Heme/Allergies: Negative for environmental allergies and polydipsia. Bruises/bleeds easily.  Psychiatric/Behavioral: Positive for depression. Negative for suicidal ideas, hallucinations, memory loss and substance abuse. The patient is nervous/anxious and has insomnia.     Blood pressure 156/79, pulse 85, temperature 97.8 F (36.6 C), resp. rate 12, height 5' 11" (1.803 m), weight 91.173 kg (201 lb), SpO2 93.00%.  Physical Exam  Constitutional: He is oriented to person, place, and time. He appears well-developed and well-nourished.  HENT:  Head: Normocephalic and atraumatic.  Nose: Nose normal.  Mouth/Throat: Oropharynx is clear and moist.  Eyes: Conjunctivae and EOM are normal. Pupils are equal, round, and reactive to light.  Neck:   Normal range of motion. Neck supple. No JVD present.  Cardiovascular: Normal rate, regular rhythm, normal heart sounds and intact distal pulses.   Respiratory: Effort normal and breath sounds normal.  GI: Soft. Bowel sounds are normal.  Musculoskeletal:       Left knee ROM-5 to 120 degrees 2+crepitus 2+synovitis.  ligamentously stable, normal patella tracking   Right knee has full range of motion 1+ crepitus, 1+synovitis and minimal pain today  Neurological: He is alert and oriented to person, place, and time. He has normal reflexes.  Skin: Skin is warm and dry.  Psychiatric: He has a normal mood and affect. His behavior is normal. Judgment and thought content normal.     Assessment Patient Active Problem List   Diagnoses  . Hypercholesterolemia  . Osteoporosis, idiopathic  . DJD (degenerative joint disease) of knee  . Detached retina, bilateral  . Anxiety   Plan: The risks, benefits, and possible complications of a left total knee replacement was discussed in detail with the patient.  The patient is without question.  Left total knee replacement by Dr [facility] on 12/18/2010 at 10 am.  Admit to [place] post operatively.  Timothy Fanton A. Gusta Marksberry, PA-C Physician Assistant [facility] [phone]  12/06/2010, 1:05 PM Timothy Osborn 12/06/2010, 12:39 PM    

## 2010-12-18 NOTE — Transfer of Care (Signed)
Immediate Anesthesia Transfer of Care Note  Patient: Timothy Osborn  Procedure(s) Performed:  TOTAL KNEE ARTHROPLASTY - ARTHROPLASTY KNEE TOTAL LEFT SIDE  Patient Location: PACU  Anesthesia Type: General  Level of Consciousness: sedated  Airway & Oxygen Therapy: Patient Spontanous Breathing and Patient connected to face mask oxygen  Post-op Assessment: Report given to PACU RN and Post -op Vital signs reviewed and stable  Post vital signs: Reviewed and stable  Complications: No apparent anesthesia complications

## 2010-12-18 NOTE — Brief Op Note (Signed)
12/18/2010  11:20 AM  PATIENT:  Timothy Osborn  77 y.o. male  PRE-OPERATIVE DIAGNOSIS:  End Stage Degenerative Joint Disease Left knee  POST-OPERATIVE DIAGNOSIS:  End Stage Degenerative Joint Disease Left knee  PROCEDURE:  Procedure(s): TOTAL KNEE ARTHROPLASTY  SURGEON:  Surgeon(s): Nilda Simmer, MD  PHYSICIAN ASSISTANT:   ASSISTANTS: Julien Girt PA-C   ANESTHESIA:   general  EBL:  Total I/O In: 1600 [I.V.:1600] Out: 300 [Urine:200; Blood:100]  BLOOD ADMINISTERED:none  DRAINS: AUTOVAC   LOCAL MEDICATIONS USED:  NONE  SPECIMEN:  No Specimen  DISPOSITION OF SPECIMEN:  N/A  COUNTS:  YES  TOURNIQUET:   Total Tourniquet Time Documented: Thigh (Left) - 72 minutes  DICTATION: .Note written in EPIC  PLAN OF CARE: Admit to inpatient   PATIENT DISPOSITION:  PACU - hemodynamically stable.   Delay start of Pharmacological VTE agent (>24hrs) due to surgical blood loss or risk of bleeding:  NO

## 2010-12-18 NOTE — Anesthesia Postprocedure Evaluation (Signed)
  Anesthesia Post-op Note  Patient: Timothy Osborn  Procedure(s) Performed:  TOTAL KNEE ARTHROPLASTY - ARTHROPLASTY KNEE TOTAL LEFT SIDE  Patient Location: PACU  Anesthesia Type: General  Level of Consciousness: awake  Airway and Oxygen Therapy: Patient Spontanous Breathing  Post-op Pain: none  Post-op Assessment: Post-op Vital signs reviewed  Post-op Vital Signs: stable  Complications: No apparent anesthesia complications

## 2010-12-18 NOTE — Op Note (Signed)
12/18/2010  11:12 AM  PATIENT:  Timothy Osborn  77 y.o. male  MRN: [id number]  PRE-OPERATIVE DIAGNOSIS:  End Stage Degenerative Joint Disease Left knee  POST-OPERATIVE DIAGNOSIS:  End Stage Degenerative Joint Disease Left knee  PROCEDURE:  Procedure(s): TOTAL KNEE ARTHROPLASTY   OPERATIVE REPORT     SURGEON:  Molly Maduro A. Thurston Hole, MD      ASSISTANT:  Kirstin A. Shepperson, PA-C  (Present throughout the entire procedure     and necessary for completion of procedure in a timely manner)      ANESTHESIA:  General with supplemental femoral nerve block.      COMPLICATIONS:  None.      COMPONENTS:  DePuy Sigma cemented total knee system.  A #4 femoral component.  A #5  tibial tray with a 12-mm polyethylene RP tibial spacer , a 38-mm polyethylene patella.     PROCEDURE: The patient was met in the holding area, and the appropriate knee identified and marked.  The patient received a  preoperative femoral  nerve block by anesthesia.      The patient was then transported to OR and was placed on the operative table in a supine position. The patient was then placed under  general anesthesia without difficulty. The patient received appropriate preoperative antibiotic prophylaxis.  The patien'ts Left knee was examined under anesthesia and shown to have minus 5-125 range of motion, mild varus deformity, ligamentously stable, and normal patella tracking. Nursing staff inserted a Foley catheter under sterile conditions.        Tourniquet was applied to the operative  thigh.  Leg was then prepped using sterile conditions and draped using sterile technique.  Time-out procedure was called and correct  Left knee identified.    The  leg was elevated and Esmarch exsanguinated with a thigh   tourniquet elevated ot 365 mmHg.      Initially, through a 15-cm longitudinal incision based over the patella initial exposure was made.  The underlying subcutaneous tissues were incised along with the skin incision.  A  median arthrotomy was performed revealing an excessive amount of normal-appearing joint fluid,  The articular surfaces were inspected.  The patient had grade 4 changes medially, grade 4 changes laterally, and grade 4 changes in the patellofemoral joint.  Osteophytes were removed from the femoral condyles and the tibial plateau.  The medial and lateral meniscal remnants were removed as well as the anterior cruciate ligament.    Intramedullary drill was then used to drill up the femoral canal for placement of the distal femoral cutting jig.  It was placed in the appropriate  rotation and a distal  13 mm cut was made.  The distal femur was sized.  A 4 size femoral component was found to be the appropriate size.  A 4 cutting jig was placed in an appropriate amount of external rotation and then chamfer cuts were made.   The proximal tibia was exposed.  The tibial spines were removed with an oscillating saw.  Intramedullary drill was used to drill down the tibial canal for placement of the proximal tibial cutting jig.  It ws placed in the appropriate amount of rotation and a proximal 6 mm cut was made based off the lower side.  Spacer blocks were then placed in flexion and extension.  A  12.5 mm block gave excellent balancing, excellent stability, and excellent correction of the patients flexion and varus  deformity.  A 5 tibial baseplate trial was then placed on the cut  tibial surface with a excellent fit.  The keel cut was then made.   The PCL box cutter was then placed on the distal femur and these cuts were made.  At this point, the #4 femoral trial was placed and with a #5 tibial baseplate trial and a 12-mm polyethylene RP tibial spacer.  The knee was reduced, taken through range of motion from 0-125 with excellent stability and excellent correction of the flexion and varus deformity with normal patella tracking.    A resurfacing  8.57mm cut was made on the undersurface of the patella and three locking holes  placed for a  38-mm polyethylene patella trial and again patellofemoral tracking was evaluated and found to be normal.  At this point, it was felt that all trial components were of excellent size, fit, and stability.  They were then removed.  The knee was irrigated copiously using jet lavage of 3 liters of sterile saline.    The proximal tibia was exposed.  A #5  tibial baseplate with zinacef impregnated cement was hammered into position with a excellent fit.  Excess cement was removed from around the edges.    A #4 femoral component with cement backing was hammered into position.  This also had excellent fit.  Excess cement was removed from around the edges.   A  38-mm polyethylene  RP tibial spacer was placed in the tibial baseplate.  The knee was reduced and taken through a full range of motion  with excellent stability and excellent correction of the flexion and varus deformities.    The 38-mm polyethylene cement backed patella was placed in its position and held there with a clamp.  After the cement had hardened, patellofemoral tracking was evaluated and found to be normal.    At this point, I felt that all components were of excellent size and fit resulting in great stability.  The wound was further irrigated with saline.  Tourniquet was deflated.  Hemostasis was obtained with cautery.  The arthrotomy was then closed with #1Ethibond suture over two medium Hemovac drains.  Subcutaneous tissues closed with 0 and 2-0 Vicryl.  The subcuticular layer was closed with a 3-0 Monocryl.  Sterile dressings were applied with a long leg knee immobilizer.  The drains were attached to an Autovac blood collection system.  The patient was awakened, extubated and taken to the recovery room in stable condition.  Needle, instrument, and sponge counts were correct times 2 at the end of the case.  Neurovascular status was normal with distal pulses 2+ and symmetric.      Gianna Calef A. Thurston Hole, MD Orthopedic  Surgeon [phone]   12/18/2010, 11:12 AM

## 2010-12-18 NOTE — Interval H&P Note (Signed)
History and Physical Interval Note:   12/18/2010   9:16 AM   Timothy Osborn  has presented today for surgery, with the diagnosis of DJD  The various methods of treatment have been discussed with the patient and family. After consideration of risks, benefits and other options for treatment, the patient has consented to  Procedure(s): TOTAL KNEE ARTHROPLASTY as a surgical intervention .  The patients' history has been reviewed, patient examined, no change in status, stable for surgery.  I have reviewed the patients' chart and labs.  Questions were answered to the patient's satisfaction.     Nilda Simmer  MD

## 2010-12-19 ENCOUNTER — Encounter (HOSPITAL_COMMUNITY): Payer: Self-pay | Admitting: Orthopedic Surgery

## 2010-12-19 LAB — BASIC METABOLIC PANEL
BUN: 12 mg/dL (ref 6–23)
Chloride: 105 mEq/L (ref 96–112)
Creatinine, Ser: 0.89 mg/dL (ref 0.50–1.35)
GFR calc Af Amer: >90 mL/min (ref >90)
Glucose, Bld: 107 mg/dL — ABNORMAL HIGH (ref 70–99)
Potassium: 3.9 mEq/L (ref 3.5–5.1)

## 2010-12-19 LAB — CBC
HCT: 35.8 % — ABNORMAL LOW (ref 39.0–52.0)
Hemoglobin: 11.7 g/dL — ABNORMAL LOW (ref 13.0–17.0)
MCV: 95.7 fL (ref 78.0–100.0)
RDW: 13.4 % (ref 11.5–15.5)
WBC: 8.4 10*3/uL (ref 4.0–10.5)

## 2010-12-19 MED ORDER — TIZANIDINE HCL 4 MG PO TABS
4.0000 mg | ORAL_TABLET | Freq: Three times a day (TID) | ORAL | Status: DC | PRN
  Administered 2010-12-19 – 2010-12-20 (×2): 4 mg via ORAL
  Filled 2010-12-19 (×2): qty 1

## 2010-12-19 MED ORDER — SODIUM CHLORIDE 0.9 % IV BOLUS (SEPSIS)
500.0000 mL | Freq: Once | INTRAVENOUS | Status: AC
  Administered 2010-12-19: 500 mL via INTRAVENOUS

## 2010-12-19 MED ORDER — MAGNESIUM HYDROXIDE 400 MG/5ML PO SUSP
30.0000 mL | Freq: Every day | ORAL | Status: AC
  Administered 2010-12-19: 30 mL via ORAL

## 2010-12-19 NOTE — Progress Notes (Signed)
Physical Therapy Treatment Patient Details Name: Timothy Osborn MRN: [id number] DOB: 11/16/1928 Today's Date: 12/19/2010  PT Assessment/Plan  PT - Assessment/Plan Comments on Treatment Session: Improving transfers and amb; on track for potential dc tomorrow from PT standpoint PT Plan: Discharge plan remains appropriate PT Frequency: 7X/week Follow Up Recommendations: Home health PT Equipment Recommended: None recommended by PT (already equipped) PT Goals  Acute Rehab PT Goals PT Goal: Supine/Side to Sit - Progress: Progressing toward goal PT Goal: Sit to Supine/Side - Progress: Progressing toward goal PT Transfer Goal: Sit to Stand/Stand to Sit - Progress: Progressing toward goal PT Goal: Ambulate - Progress: Progressing toward goal PT Goal: Up/Down Stairs - Progress: Progressing toward goal PT Goal: Perform Home Exercise Program - Progress: Progressing toward goal  PT Treatment Precautions/Restrictions  Precautions Precautions: Knee Restrictions Weight Bearing Restrictions: Yes LLE Weight Bearing: Weight bearing as tolerated Mobility (including Balance) Bed Mobility Supine to Sit: 4: Min assist;Other (comment) (with KI on) Supine to Sit Details (indicate cue type and reason): much smoother; cues to half-bridge to EOB in prep for supine to sit Transfers Sit to Stand: 4: Min assist;From elevated surface (without physical contact) Sit to Stand Details (indicate cue type and reason): cues for hand placement; assist to steady RW Stand to Sit: 4: Min assist;To bed Stand to Sit Details: cues for control, hand placement Ambulation/Gait Ambulation/Gait Assistance: 4: Min assist (with and without physical contact) Ambulation/Gait Assistance Details (indicate cue type and reason): cues for sequence/posture; much improved amb! Ambulation Distance (Feet): 85 Feet Assistive device: Rolling walker Gait Pattern: Decreased step length - right;Decreased stance time - left;Antalgic (though  step lengths improving) Stairs:  (discussed technique for stairs)    Exercise  Total Joint Exercises Quad Sets: Other (comment) Heel Slides: Other (comment) (NT) End of Session PT - End of Session Equipment Utilized During Treatment: Gait belt;Left knee immobilizer Activity Tolerance: Patient limited by fatigue Patient left: in bed;in CPM;with call bell in reach (set to 55 degrees flexion) Nurse Communication: Mobility status for transfers;Mobility status for ambulation General Behavior During Session: [facility] for tasks performed Cognition: [facility] for tasks performed  Van Clines [facility] 12/19/2010, 5:21 PM [place], [place] [phone]

## 2010-12-19 NOTE — Progress Notes (Signed)
Patient ID: Timothy Osborn, male   DOB: [birth year], 77 y.o.   MRN: [id number] PATIENT ID: Timothy Osborn  MRN: [id number]  DOB/AGE:  May 01, 1928 / 77 y.o.  1 Day Post-Op Procedure(s) (LRB): TOTAL KNEE ARTHROPLASTY (Left)    PROGRESS NOTE Subjective: Patient is alert, oriented, no Nausea, no Vomiting, no passing gas, no Bowel Movement. Taking PO ate breakfast. Denies SOB, Chest or Calf Pain. Using Incentive Spirometer, PAS in place. Ambulate not yet, CPM 0-90 Patient reports pain as 4 on 0-10 scale  .    Objective: Vital signs in last 24 hours: Filed Vitals:   12/18/10 1348 12/18/10 2205 12/19/10 0241 12/19/10 0550  BP: 105/65 117/62 120/66 97/59  Pulse: 78 100 96 88  Temp: 97.8 F (36.6 C) 98.5 F (36.9 C) 98.8 F (37.1 C) 98.1 F (36.7 C)  TempSrc: Oral     Resp: 18 20 18 18   SpO2: 93% 97% 98% 92%      Intake/Output from previous day: I/O last 3 completed shifts: In: 2250 [I.V.:1650; Blood:300; Other:300] Out: 2300 [Urine:1650; Drains:500; Blood:150]   Intake/Output this shift: Total I/O In: 240 [P.O.:240] Out: -    LABORATORY DATA:  Basename 12/19/10 0520  WBC 8.4  HGB 11.7*  HCT 35.8*  PLT 208  NA 140  K 3.9  CL 105  CO2 30  BUN 12  CREATININE 0.89  GLUCOSE 107*  INR --  CALCIUM 7.8*    Examination: ABD soft Neurovascular intact Sensation intact distally Intact pulses distally Dorsiflexion/Plantar flexion intact Incision: dressing C/D/I no palpitations, no wheezing}  Assessment:   1 Day Post-Op Procedure(s) (LRB): TOTAL KNEE ARTHROPLASTY (Left) ADDITIONAL DIAGNOSIS:  Acute Blood Loss Anemia and .prob  Plan: PT/OT WBAT, CPM 5/hrs day until ROM 0-90 degrees, then D/C CPM DVT Prophylaxis:  Lovenox\Coumadin bridge target INR 1.5-2.0 DISCHARGE PLAN: Home DISCHARGE NEEDS: HHPT, CPM, Walker and 3-in-1 comode seat     Ladonne Sharples J 12/19/2010, 8:27 AM

## 2010-12-19 NOTE — Progress Notes (Signed)
Physical Therapy Evaluation Patient Details Name: Timothy Osborn MRN: [id number] DOB: January 18, 1929 Today's Date: 12/19/2010  Problem List:  Patient Active Problem List  Diagnoses  . Hypercholesterolemia  . Osteoporosis, idiopathic  . DJD (degenerative joint disease) of knee  . Detached retina, bilateral  . Anxiety    Past Medical History:  Past Medical History  Diagnosis Date  . GERD (gastroesophageal reflux disease)   . Anxiety   . Hypercholesterolemia   . Depression     when he retired.....  Marland Kitchen Arthritis   . Osteoporosis     /OSTEOPENIA  . OCD (obsessive compulsive disorder)     "MILD CASE"   Past Surgical History:  Past Surgical History  Procedure Date  . Tonsillectomy   . Hernia repair     left  . Total knee arthroplasty 12/18/2010    Procedure: TOTAL KNEE ARTHROPLASTY;  Surgeon: Nilda Simmer, MD;  Location: [facility] OR;  Service: Orthopedics;  Laterality: Left;  ARTHROPLASTY KNEE TOTAL LEFT SIDE    PT Assessment/Plan/Recommendation PT Assessment Clinical Impression Statement: 77yo male s/p LTKA presents with functional dependencies secondary to the impairments listed below; Will benefit from PT to maximize Independence and safety with mobiity, amb, actiivity tol, step negotiation prior to dc home with wife A PT Recommendation/Assessment: Patient will need skilled PT in the acute care venue PT Problem List: Decreased strength;Decreased range of motion;Decreased activity tolerance;Decreased mobility;Decreased knowledge of use of DME;Decreased knowledge of precautions;Pain PT Therapy Diagnosis : Difficulty walking;Abnormality of gait;Acute pain PT Plan PT Frequency: 7X/week PT Treatment/Interventions: DME instruction;Gait training;Stair training;Functional mobility training;Therapeutic exercise;Patient/family education PT Recommendation Recommendations for Other Services: OT consult Follow Up Recommendations: Home health PT Equipment Recommended: Rolling walker with  5" wheels;3 in 1 bedside comode;Other (comment) (Tall) PT Goals  Acute Rehab PT Goals PT Goal Formulation: With patient Time For Goal Achievement: 7 days Pt will go Supine/Side to Sit: with modified independence PT Goal: Supine/Side to Sit - Progress: Progressing toward goal Pt will go Sit to Supine/Side: with modified independence PT Goal: Sit to Supine/Side - Progress: Progressing toward goal Pt will Transfer Sit to Stand/Stand to Sit: with modified independence PT Transfer Goal: Sit to Stand/Stand to Sit - Progress: Progressing toward goal Pt will Ambulate: >150 feet;with supervision;with rolling walker (without Left Knee buckle) PT Goal: Ambulate - Progress: Progressing toward goal Pt will Go Up / Down Stairs: 6-9 stairs;with min assist;with rail(s) PT Goal: Up/Down Stairs - Progress: Other (comment) Pt will Perform Home Exercise Program: with supervision, verbal cues required/provided PT Goal: Perform Home Exercise Program - Progress: Progressing toward goal  PT Evaluation Precautions/Restrictions  Precautions Precautions: Knee Restrictions LLE Weight Bearing: Weight bearing as tolerated Prior Functioning  Home Living Lives With: Spouse Receives Help From: Family Type of Home: House Home Layout: Two level;Able to live on main level with bedroom/bathroom Home Access: Stairs to enter Entrance Stairs-Rails: Left Entrance Stairs-Number of Steps: 6 Bathroom Toilet: Standard Home Adaptive Equipment: None Prior Function Level of Independence: Independent with basic ADLs;Independent with gait Driving: Yes Leisure: Hobbies-yes (Comment) Comments: musician and TEFL teacher Cognition Arousal/Alertness: Awake/alert Overall Cognitive Status: Appears within functional limits for tasks assessed Orientation Level: Oriented X4 Sensation/Coordination Sensation Light Touch: Appears Intact Coordination Gross Motor Movements are Fluid and Coordinated: Yes Fine Motor  Movements are Fluid and Coordinated: Yes Extremity Assessment RUE Assessment RUE Assessment: Within Functional Limits LUE Assessment LUE Assessment: Within Functional Limits RLE Assessment RLE Assessment: Within Functional Limits (some arthritis pain) LLE Assessment LLE Assessment: Exceptions to  WFL LLE Strength LLE Overall Strength Comments: decr ROM and strength limited by pain postop; positive quad activation, though weak; flex to approx 65 degrees Mobility (including Balance) Bed Mobility Bed Mobility: Yes Supine to Sit: 3: Mod assist;With rails Supine to Sit Details (indicate cue type and reason): step-by-step cues for technique Transfers Transfers: Yes Sit to Stand: 4: Min assist;From elevated surface Sit to Stand Details (indicate cue type and reason): cues for safe technique, hand placement, self-monitor for dizziness and activity tolerance Stand to Sit: 3: Mod assist;To chair/3-in-1;To elevated surface;With armrests (physical assist to control descent) Stand to Sit Details: cues to position for comfort; safe hand placement Ambulation/Gait Ambulation/Gait: Yes Ambulation/Gait Assistance: 4: Min assist Ambulation/Gait Assistance Details (indicate cue type and reason): near constant cues for sequence; self-monitor for dizziness/activity tolerance Ambulation Distance (Feet): 25 Feet Assistive device: Rolling walker Gait Pattern: Decreased step length - right;Decreased stance time - left;Antalgic    Exercise  Total Joint Exercises Quad Sets: AROM;Left;10 reps;Supine Heel Slides: AAROM;Left;5 reps;Supine End of Session PT - End of Session Equipment Utilized During Treatment: Gait belt;Left knee immobilizer Activity Tolerance: Patient limited by fatigue Patient left: in chair;with family/visitor present Nurse Communication: Mobility status for transfers;Mobility status for ambulation General Behavior During Session: [facility] for tasks performed Cognition: [facility] for tasks  performed  Van Clines [facility] 12/19/2010, 1:12 PM [place], [place] [phone]

## 2010-12-20 LAB — BASIC METABOLIC PANEL
BUN: 14 mg/dL (ref 6–23)
Calcium: 8 mg/dL — ABNORMAL LOW (ref 8.4–10.5)
Creatinine, Ser: 0.87 mg/dL (ref 0.50–1.35)
GFR calc Af Amer: >90 mL/min (ref >90)
GFR calc non Af Amer: 79 mL/min — ABNORMAL LOW (ref >90)
Glucose, Bld: 117 mg/dL — ABNORMAL HIGH (ref 70–99)

## 2010-12-20 LAB — CBC
HCT: 31.7 % — ABNORMAL LOW (ref 39.0–52.0)
Hemoglobin: 10.3 g/dL — ABNORMAL LOW (ref 13.0–17.0)
MCH: 30.8 pg (ref 26.0–34.0)
MCHC: 32.5 g/dL (ref 30.0–36.0)
MCV: 94.9 fL (ref 78.0–100.0)
RDW: 13.4 % (ref 11.5–15.5)

## 2010-12-20 MED ORDER — HYDROCODONE-ACETAMINOPHEN 5-325 MG PO TABS: 1.0000 | ORAL_TABLET | ORAL | Status: AC | PRN

## 2010-12-20 MED ORDER — ENOXAPARIN SODIUM 30 MG/0.3ML ~~LOC~~ SOLN: 30.0000 mg | Freq: Two times a day (BID) | SUBCUTANEOUS | Status: DC

## 2010-12-20 MED ORDER — DSS 100 MG PO CAPS: 100.0000 mg | ORAL_CAPSULE | Freq: Two times a day (BID) | ORAL | Status: AC

## 2010-12-20 MED ORDER — TIZANIDINE HCL 4 MG PO TABS: 4.0000 mg | ORAL_TABLET | Freq: Three times a day (TID) | ORAL | Status: AC | PRN

## 2010-12-20 MED ORDER — CELECOXIB 200 MG PO CAPS: 200.0000 mg | ORAL_CAPSULE | Freq: Every day | ORAL | Status: AC

## 2010-12-20 NOTE — Progress Notes (Signed)
Physical Therapy Treatment Patient Details Name: Timothy Osborn MRN: [id number] DOB: 1928-07-14 Today's Date: 12/20/2010  PT Assessment/Plan  PT - Assessment/Plan Comments on Treatment Session: Pt less impulsive and with decreased pain this PM PT Plan: Discharge plan remains appropriate PT Frequency: 7X/week Follow Up Recommendations: Home health PT Equipment Recommended: None recommended by OT PT Goals  Acute Rehab PT Goals PT Goal: Supine/Side to Sit - Progress: Met PT Goal: Sit to Supine/Side - Progress: Progressing toward goal PT Transfer Goal: Sit to Stand/Stand to Sit - Progress: Progressing toward goal PT Goal: Ambulate - Progress: Met PT Goal: Up/Down Stairs - Progress: Progressing toward goal  PT Treatment Precautions/Restrictions  Precautions Precautions: Knee Required Braces or Orthoses: No Restrictions Weight Bearing Restrictions: Yes LLE Weight Bearing: Weight bearing as tolerated Mobility (including Balance) Bed Mobility Bed Mobility: Yes Supine to Sit: 6: Modified independent (Device/Increase time) Transfers Sit to Stand: With upper extremity assist;4: Min assist;From bed;From chair/3-in-1 Sit to Stand Details (indicate cue type and reason): A for balance. Cues for safe hand placement Stand to Sit: Other (comment);To chair/3-in-1;To bed (MinGuard A) Stand to Sit Details: Pt. sat a lot better this PM. Good control of sit Ambulation/Gait Ambulation/Gait: Yes Ambulation/Gait Assistance: 5: Supervision Ambulation/Gait Assistance Details (indicate cue type and reason): Cues for posture Ambulation Distance (Feet): 160 Feet Assistive device: Rolling walker Gait Pattern: Step-to pattern;Decreased step length - right;Trunk flexed Stairs: Yes Stairs Assistance: 4: Min assist Stairs Assistance Details (indicate cue type and reason): A for balance Stair Management Technique: One rail Right;Sideways Number of Stairs: 2     Exercise    End of Session PT - End  of Session Equipment Utilized During Treatment: Gait belt Activity Tolerance: Patient tolerated treatment well Patient left: in bed;with call bell in reach Nurse Communication: Mobility status for transfers;Mobility status for ambulation General Behavior During Session: [facility] for tasks performed Cognition: [facility] for tasks performed  Layli Capshaw, Adline Potter 12/20/2010, 2:32 PM 12/20/2010 Fredrich Birks PTA [phone] pager [phone] office

## 2010-12-20 NOTE — Progress Notes (Signed)
Occupational Therapy Evaluation Patient Details Name: Timothy Osborn MRN: [id number] DOB: 08-22-1928 Today's Date: 12/20/2010  Problem List:  Patient Active Problem List  Diagnoses  . Hypercholesterolemia  . Osteoporosis, idiopathic  . DJD (degenerative joint disease) of knee  . Detached retina, bilateral  . Anxiety    Past Medical History:  Past Medical History  Diagnosis Date  . GERD (gastroesophageal reflux disease)   . Anxiety   . Hypercholesterolemia   . Depression     when he retired.....  Marland Kitchen Arthritis   . Osteoporosis     /OSTEOPENIA  . OCD (obsessive compulsive disorder)     "MILD CASE"   Past Surgical History:  Past Surgical History  Procedure Date  . Tonsillectomy   . Hernia repair     left  . Total knee arthroplasty 12/18/2010    Procedure: TOTAL KNEE ARTHROPLASTY;  Surgeon: Nilda Simmer, MD;  Location: [facility] OR;  Service: Orthopedics;  Laterality: Left;  ARTHROPLASTY KNEE TOTAL LEFT SIDE    OT Assessment/Plan/Recommendation OT Assessment OT Recommendation/Assessment: Patient does not need any further OT services OT Recommendation Equipment Recommended: None recommended by OT OT Goals    OT Evaluation Precautions/Restrictions  Precautions Precautions: Knee Required Braces or Orthoses: No Restrictions Weight Bearing Restrictions: Yes LLE Weight Bearing: Weight bearing as tolerated Prior Functioning Home Living Lives With: Spouse Receives Help From: Family Type of Home: House Home Layout: Two level;Able to live on main level with bedroom/bathroom Home Access: Stairs to enter Entrance Stairs-Rails: Left Entrance Stairs-Number of Steps: 6 Bathroom Shower/Tub: Walk-in shower;Door [employer] Toilet: Standard Bathroom Accessibility: Yes How Accessible: Accessible via walker Home Adaptive Equipment: Bedside commode/3-in-1 Prior Function Level of Independence: Independent with basic ADLs;Independent with homemaking with ambulation;Independent  with homemaking with wheelchair;Independent with gait Driving: Yes Vocation: Retired ADL ADL Lower Body Bathing: Simulated;Supervision/safety;Other (comment) (pt about to sit EOB and reach feet with hands) Where Assessed - Lower Body Bathing: Sitting, bed;Unsupported Tub/Shower Transfer: Simulated;Supervision/safety (pt stepping over 3 inch high box to simulate shower edge) Tub/Shower Transfer Details (indicate cue type and reason): pt completed shower transfer and wife present for education (pt able to verbalize instructions back to Ot) Tub/Shower Transfer Method: Stand pivot;Ambulating Tub/Shower Transfer Equipment: Walk in shower (3n1 positioned in shower) Equipment Used: Rolling walker Vision/Perception  Vision - History Patient Visual Report: No change from baseline Cognition Cognition Overall Cognitive Status: Appears within functional limits for tasks assessed Orientation Level: Oriented X4 Sensation/Coordination Coordination Gross Motor Movements are Fluid and Coordinated: Yes Fine Motor Movements are Fluid and Coordinated: Yes Extremity Assessment RUE Assessment RUE Assessment: Within Functional Limits LUE Assessment LUE Assessment: Within Functional Limits Mobility  Bed Mobility Bed Mobility: Yes Supine to Sit: 6: Modified independent (Device/Increase time);With rails;HOB elevated (Comment degrees) (20) Transfers Transfers: Yes Sit to Stand: 4: Min assist;With upper extremity assist (pt Min Guard A level Mod v/c) Exercises   End of Session OT - End of Session Equipment Utilized During Treatment: Gait belt Activity Tolerance: Patient tolerated treatment well Patient left: Other (comment) (with PTA Raynelle Fanning)) Nurse Communication: Mobility status for transfers;Mobility status for ambulation General Behavior During Session: [facility] for tasks performed Cognition: [facility] for tasks performed   Lucile Shutters 12/20/2010, 2:19 PM  Pager: [phone]

## 2010-12-20 NOTE — Progress Notes (Signed)
Physical Therapy Treatment Patient Details Name: Timothy Osborn MRN: [id number] DOB: 08-Jan-1929 Today's Date: 12/20/2010  PT Assessment/Plan  PT - Assessment/Plan Comments on Treatment Session: Pt. slightly impulsive today. Requires cues to stop and listen to full instruction before attempting activities. Pts wife present throughout. Will attempt stairs again next session before DC home.  PT Plan: Discharge plan remains appropriate PT Frequency: 7X/week Follow Up Recommendations: Home health PT Equipment Recommended: None recommended by PT PT Goals  Acute Rehab PT Goals PT Goal Formulation: With patient PT Goal: Supine/Side to Sit - Progress: Progressing toward goal PT Goal: Sit to Supine/Side - Progress: Progressing toward goal PT Transfer Goal: Sit to Stand/Stand to Sit - Progress: Progressing toward goal PT Goal: Ambulate - Progress: Progressing toward goal PT Goal: Up/Down Stairs - Progress: Progressing toward goal PT Goal: Perform Home Exercise Program - Progress: Progressing toward goal  PT Treatment Precautions/Restrictions  Precautions Precautions: Knee Restrictions Weight Bearing Restrictions: Yes LLE Weight Bearing: Weight bearing as tolerated Mobility (including Balance) Bed Mobility Bed Mobility: Yes Supine to Sit: 4: Min assist Supine to Sit Details (indicate cue type and reason): Cues for safe technique and positioning. A for LLE Transfers Transfers: Yes Sit to Stand: 4: Min assist;From bed;From chair/3-in-1 Sit to Stand Details (indicate cue type and reason): A for safe hand placement and required A from lower surface.  Stand to Sit: 4: Min assist;To chair/3-in-1 Stand to Sit Details: Cues for safe hand placement and to slide out LLE. A to control descent into chair.  Ambulation/Gait Ambulation/Gait: Yes Ambulation/Gait Assistance: 4: Min assist Ambulation/Gait Assistance Details (indicate cue type and reason): Cues for posture and gait sequence.    Ambulation Distance (Feet): 200 Feet (required one resting break) Assistive device: Rolling walker Gait Pattern: Decreased step length - right;Decreased step length - left;Step-to pattern;Trunk flexed;Antalgic Gait velocity: decreased Stairs: Yes Stairs Assistance: 4: Min assist Stairs Assistance Details (indicate cue type and reason): Cues for sequency and technique. A for balance.  Stair Management Technique: One rail Left;Sideways Number of Stairs: 3     Exercise  Total Joint Exercises Quad Sets: AROM;Strengthening;Left;10 reps;Supine Short Arc Quad: AAROM;Strengthening;Left;10 reps;Supine Heel Slides: AAROM;Strengthening;Left;10 reps;Supine Hip ABduction/ADduction: AAROM;Strengthening;Left;10 reps;Supine Straight Leg Raises: AAROM;Strengthening;Left;10 reps;Supine End of Session PT - End of Session Equipment Utilized During Treatment: Gait belt Activity Tolerance: Patient limited by fatigue Patient left: in bed;with family/visitor present Nurse Communication: Mobility status for transfers;Mobility status for ambulation General Behavior During Session: [facility] for tasks performed Cognition: [facility] for tasks performed  Robinette, Adline Potter 12/20/2010, 10:01 AM 12/20/2010 Fredrich Birks PTA [phone] pager [phone] office

## 2010-12-20 NOTE — Discharge Summary (Signed)
Physician Discharge Summary  Patient ID: Timothy Osborn MRN: [id number] DOB/AGE: 75-14-1930 75 y.o.  Admit date: 12/18/2010 Discharge date: 12/20/2010  Admission Diagnoses: Patient Active Problem List  Diagnoses  . Hypercholesterolemia  . Osteoporosis, idiopathic  . DJD (degenerative joint disease) of knee  . Detached retina, bilateral  . Anxiety    Discharge Diagnoses:  Principal Problem:  *DJD (degenerative joint disease) of knee Active Problems:  Anxiety   Discharged Condition: good  Hospital Course: This he is being discharged to time and stable condition weightbearing as tolerated on a regular diet he will followup with Dr. Wyline Mood on 01/01/1999 12th at 3:45 in the afternoon he'll receive home health physical therapy patient underwent a left total may replacement by Dr. Wyline Mood on 1119 2012th he had a left femoral nerve block preoperatively by anesthesia and postoperatively had an Autovac transfusion which he tolerated well. He tolerated his CPM 0-45 in the recovery room. Postop day 1 he increased his CPM 0-60 postop day 1 he was metabolically stable and his hemoglobin was above 10. He ambulated to the nurses station with the assistance of physical therapy and a rolling walker postop day 2 he is alert and oriented x3 his pain is controlled on hydrocodone and Zanaflex.  Consults: none  Significant Diagnostic Studies: labs:  Results for orders placed during the hospital encounter of 12/18/10 (from the past 48 hour(s))  CBC     Status: Abnormal   Collection Time   12/19/10  5:20 AM      Component Value Range Comment   WBC 8.4  4.0 - 10.5 (K/uL)    RBC 3.74 (*) 4.22 - 5.81 (MIL/uL)    Hemoglobin 11.7 (*) 13.0 - 17.0 (g/dL)    HCT 19.1 (*) 47.8 - 52.0 (%)    MCV 95.7  78.0 - 100.0 (fL)    MCH 31.3  26.0 - 34.0 (pg)    MCHC 32.7  30.0 - 36.0 (g/dL)    RDW 29.5  62.1 - 30.8 (%)    Platelets 208  150 - 400 (K/uL)   BASIC METABOLIC PANEL     Status: Abnormal   Collection  Time   12/19/10  5:20 AM      Component Value Range Comment   Sodium 140  135 - 145 (mEq/L)    Potassium 3.9  3.5 - 5.1 (mEq/L)    Chloride 105  96 - 112 (mEq/L)    CO2 30  19 - 32 (mEq/L)    Glucose, Bld 107 (*) 70 - 99 (mg/dL)    BUN 12  6 - 23 (mg/dL)    Creatinine, Ser 6.57  0.50 - 1.35 (mg/dL)    Calcium 7.8 (*) 8.4 - 10.5 (mg/dL)    GFR calc non Af Amer 79 (*) >90 (mL/min)    GFR calc Af Amer >90  >90 (mL/min)   CBC     Status: Abnormal   Collection Time   12/20/10  5:00 AM      Component Value Range Comment   WBC 9.4  4.0 - 10.5 (K/uL)    RBC 3.34 (*) 4.22 - 5.81 (MIL/uL)    Hemoglobin 10.3 (*) 13.0 - 17.0 (g/dL)    HCT 84.6 (*) 96.2 - 52.0 (%)    MCV 94.9  78.0 - 100.0 (fL)    MCH 30.8  26.0 - 34.0 (pg)    MCHC 32.5  30.0 - 36.0 (g/dL)    RDW 95.2  84.1 - 32.4 (%)    Platelets  191  150 - 400 (K/uL)   BASIC METABOLIC PANEL     Status: Abnormal   Collection Time   12/20/10  5:00 AM      Component Value Range Comment   Sodium 137  135 - 145 (mEq/L)    Potassium 3.7  3.5 - 5.1 (mEq/L)    Chloride 103  96 - 112 (mEq/L)    CO2 31  19 - 32 (mEq/L)    Glucose, Bld 117 (*) 70 - 99 (mg/dL)    BUN 14  6 - 23 (mg/dL)    Creatinine, Ser 1.61  0.50 - 1.35 (mg/dL)    Calcium 8.0 (*) 8.4 - 10.5 (mg/dL)    GFR calc non Af Amer 79 (*) >90 (mL/min)    GFR calc Af Amer >90  >90 (mL/min)     Treatments: IV hydration, antibiotics: Ancef, analgesia: Vicodin, anticoagulation: LMW heparin, therapies: PT, OT and RN,   Discharge Exam: Blood pressure 103/67, pulse 91, temperature 98.2 F (36.8 C), temperature source Oral, resp. rate 20, SpO2 95.00%. General appearance: alert and cooperative Resp: clear to auscultation bilaterally Cardio: regular rate and rhythm, S1, S2 normal, no murmur, click, rub or gallop Extremities: left knee wound well approximated.  -10 to 70 degrees Pulses: 2+ and symmetric Neurologic: Grossly normal Incision/Wound:well approximated  Disposition: Final  discharge disposition not confirmedw  Discharge Orders    Future Orders Please Complete By Expires   Diet - low sodium heart healthy      Call MD / Call 911      Comments:   If you experience chest pain or shortness of breath, CALL 911 and be transported to the hospital emergency room.  If you develope a fever above 101 F, pus (white drainage) or increased drainage or redness at the wound, or calf pain, call your surgeon's office.   Constipation Prevention      Comments:   Drink plenty of fluids.  Prune juice may be helpful.  You may use a stool softener, such as Colace (over the counter) 100 mg twice a day.  Use MiraLax (over the counter) for constipation as needed.   Increase activity slowly as tolerated      Weight Bearing as taught in Physical Therapy      Comments:   Use a walker or crutches as instructed.   Discharge instructions      Comments:   See total knee instructions below   Driving restrictions      Comments:   No driving for 4 weeks   Lifting restrictions      Comments:   No lifting for 4 weeks   CPM      Comments:   Continuous passive motion machine (CPM):      Use the CPM from0 to 90 for 6 hours per day.      You may increase by 10 per day.  You may break it up into 2 or 3 sessions per day.      Use CPM for 2 weeks or until you are told to stop.   TED hose      Comments:   Use stockings (TED hose) for 4 weeks on B leg(s).  You may remove them at night for sleeping.   Change dressing      Comments:   change the dressing daily with sterile 4 x 4 inch gauze dressing and apply TED hose.  You may clean the incision with alcohol prior to redressing.   Do not  put a pillow under the knee. Place it under the heel.      Scheduling Instructions:   Place yellow foam block, yellow side up under heel at all times except when in CPM or when walking.  DO NOT modify, tear, cut, or change in any way the yellow foam block.     Current Discharge Medication List    START taking  these medications   Details  celecoxib (CELEBREX) 200 MG capsule Take 1 capsule (200 mg total) by mouth daily. Qty: 30 capsule, Refills: 0    docusate sodium 100 MG CAPS Take 100 mg by mouth 2 (two) times daily. Qty: 60 capsule, Refills: 0    enoxaparin (LOVENOX) 30 MG/0.3ML SOLN Inject 0.3 mLs (30 mg total) into the skin every 12 (twelve) hours. Qty: 24 Syringe, Refills: 0    HYDROcodone-acetaminophen (NORCO) 5-325 MG per tablet Take 1-2 tablets by mouth every 4 (four) hours as needed. Qty: 60 tablet, Refills: 0    tiZANidine (ZANAFLEX) 4 MG tablet Take 1 tablet (4 mg total) by mouth every 8 (eight) hours as needed. Qty: 45 tablet, Refills: 0      CONTINUE these medications which have NOT CHANGED   Details  calcium-vitamin D (OSCAL WITH D) 500-200 MG-UNIT per tablet Take 2 tablets by mouth daily.      multivitamin-iron-minerals-folic acid (CENTRUM) chewable tablet Chew 1 tablet by mouth daily.      omeprazole (PRILOSEC) 40 MG capsule Take 40 mg by mouth daily.      PARoxetine (PAXIL) 40 MG tablet Take 40 mg by mouth every morning.      simvastatin (ZOCOR) 20 MG tablet Take 20 mg by mouth at bedtime.      Thiamine HCl (VITAMIN B-1) 100 MG tablet Take 100 mg by mouth daily.      alendronate (FOSAMAX) 70 MG tablet Take 70 mg by mouth every 7 (seven) days. Take with a full glass of water on an empty stomach.       STOP taking these medications     aspirin 81 MG tablet      co-enzyme Q-10 30 MG capsule      piroxicam (FELDENE) 10 MG capsule        Follow-up Information    Follow up with Nilda Simmer, MD on 01/01/2011. (appt time 3:30)    Contact information:   [facility] [address], [address] [place] [address] [phone]          Signed: Pascal Lux 12/20/2010, 7:00 AM

## 2010-12-20 NOTE — Progress Notes (Signed)
D/C instructions reviewed with patient and wife. RX x 5 given. All questions answered. lovenox self administration teaching done. Has hh equipment at home. hh services arranged with [facility]. Pt d/c'ed via wheelchair in stable condition

## 2010-12-20 NOTE — Progress Notes (Signed)
12/20/2010 Patient is being discharged from PT/ OT/ SLP services secondary to:  Goals met and no further therapy needs identified.  Please see latest Therapy Progress Note for current level of functioning and progress toward goals.  Progress and discharge plan and discussed with patient/caregiver and they  Agree   OT SIGNING OFF   Lucile Shutters   OTR/L Pager: [phone] Office: [phone] .

## 2010-12-20 NOTE — Progress Notes (Signed)
Patient ID: Timothy Osborn, male   DOB: [birth year], 77 y.o.   MRN: [id number] PATIENT ID: Timothy Osborn  MRN: [id number]  DOB/AGE:  20-Jul-1928 / 77 y.o.  2 Days Post-Op Procedure(s) (LRB): TOTAL KNEE ARTHROPLASTY (Left)    PROGRESS NOTE Subjective: Patient is alert, oriented, no Nausea, no Vomiting, yes passing gas, no Bowel Movement. Taking PO eating well. Denies SOB, Chest or Calf Pain. Using Incentive Spirometer, PAS in place. Ambulate well to the nurses station, CPM 0-60 Patient reports pain as 2 on 0-10 scale  .    Objective: Vital signs in last 24 hours: Filed Vitals:   12/19/10 0550 12/19/10 1232 12/19/10 2209 12/20/10 0619  BP: 97/59 116/68 92/54 103/67  Pulse: 88 88 84 91  Temp: 98.1 F (36.7 C) 97.8 F (36.6 C) 97.9 F (36.6 C) 98.2 F (36.8 C)  TempSrc:  Oral    Resp: 18 18 19 20   SpO2: 92% 94% 90% 95%      Intake/Output from previous day: I/O last 3 completed shifts: In: 4470 [P.O.:720; I.V.:2150; Blood:300; Other:300; IV Piggyback:1000] Out: 3000 [Urine:2350; Drains:500; Blood:150]   Intake/Output this shift: Total I/O In: -  Out: 300 [Urine:300]   LABORATORY DATA:  Basename 12/20/10 0500 12/19/10 0520  WBC 9.4 8.4  HGB 10.3* 11.7*  HCT 31.7* 35.8*  PLT 191 208  NA 137 140  K 3.7 3.9  CL 103 105  CO2 31 30  BUN 14 12  CREATININE 0.87 0.89  GLUCOSE 117* 107*  INR -- --  CALCIUM 8.0* --    Examination: ABD soft Neurovascular intact Sensation intact distally Intact pulses distally Dorsiflexion/Plantar flexion intact Incision: scant drainage minimal swelling}  Assessment:   2 Days Post-Op Procedure(s) (LRB): TOTAL KNEE ARTHROPLASTY (Left) ADDITIONAL DIAGNOSIS:  Acute Blood Loss Anemia and anxiety  Plan: PT/OT WBAT, CPM 5/hrs day until ROM 0-90 degrees, then D/C CPM DVT Prophylaxis:  Lovenox\Coumadin bridge target INR 1.5-2.0 DISCHARGE PLAN: Home DISCHARGE NEEDS: HHPT, CPM, Walker and 3-in-1 comode seat D/C to home.  Daley Gosse A.  Gwinda Passe Physician Assistant [facility] [phone]  12/20/2010, 6:46 AM     Pascal Lux 12/20/2010, 6:42 AM

## 2011-07-04 ENCOUNTER — Other Ambulatory Visit: Payer: Self-pay | Admitting: Sports Medicine

## 2011-07-04 DIAGNOSIS — M545 Low back pain: Secondary | ICD-10-CM

## 2011-07-06 ENCOUNTER — Ambulatory Visit
Admission: RE | Admit: 2011-07-06 | Discharge: 2011-07-06 | Disposition: A | Discharge status: Home / Self Care | Payer: Medicare Other | Source: Ambulatory Visit | Attending: Sports Medicine | Admitting: Sports Medicine

## 2011-07-06 DIAGNOSIS — M545 Low back pain: Secondary | ICD-10-CM

## 2011-08-13 ENCOUNTER — Encounter (HOSPITAL_COMMUNITY): Payer: Self-pay | Admitting: Pharmacy Technician

## 2011-08-14 ENCOUNTER — Other Ambulatory Visit: Payer: Self-pay | Admitting: Neurological Surgery

## 2011-08-20 NOTE — Pre-Procedure Instructions (Signed)
[name]  08/20/2011   Your procedure is scheduled on:  Wednesday August 29, 2011.  Report to [facility] at 0630 AM.  Call this number if you have problems the morning of surgery: [phone]   Remember:   Do not eat food:After Midnight.    Take these medicines the morning of surgery with A SIP OF WATER: Omeprazole (Prilosec), and Paroxetine (Paxil)   Do not wear jewelry  Do not wear lotions or colognes.  Men may shave face and neck.  Do not bring valuables to the hospital.  Contacts, dentures or bridgework may not be worn into surgery.  Leave suitcase in the car. After surgery it may be brought to your room.  For patients admitted to the hospital, checkout time is 11:00 AM the day of discharge.   Patients discharged the day of surgery will not be allowed to drive home.  Name and phone number of your driver:   Special Instructions: CHG Shower Use Special Wash: 1/2 bottle night before surgery and 1/2 bottle morning of surgery.   Please read over the following fact sheets that you were given: Pain Booklet, Coughing and Deep Breathing, MRSA Information and Surgical Site Infection Prevention

## 2011-08-21 ENCOUNTER — Encounter (HOSPITAL_COMMUNITY)
Admission: RE | Admit: 2011-08-21 | Discharge: 2011-08-21 | Disposition: A | Discharge status: Home / Self Care | Payer: Medicare Other | Source: Ambulatory Visit | Attending: Neurological Surgery | Admitting: Neurological Surgery

## 2011-08-21 ENCOUNTER — Encounter (HOSPITAL_COMMUNITY): Payer: Self-pay

## 2011-08-21 LAB — CBC WITH DIFFERENTIAL/PLATELET
Basophils Absolute: 0 10*3/uL (ref 0.0–0.1)
Basophils Relative: 1 % (ref 0–1)
Eosinophils Absolute: 0.2 10*3/uL (ref 0.0–0.7)
MCH: 30.8 pg (ref 26.0–34.0)
MCHC: 33.7 g/dL (ref 30.0–36.0)
Neutrophils Relative %: 50 % (ref 43–77)
Platelets: 271 10*3/uL (ref 150–400)
RDW: 15 % (ref 11.5–15.5)

## 2011-08-21 LAB — BASIC METABOLIC PANEL
BUN: 20 mg/dL (ref 6–23)
Calcium: 9 mg/dL (ref 8.4–10.5)
GFR calc Af Amer: >90 mL/min (ref >90)
GFR calc non Af Amer: 80 mL/min — ABNORMAL LOW (ref >90)
Glucose, Bld: 84 mg/dL (ref 70–99)

## 2011-08-21 LAB — PROTIME-INR
INR: 1.05 (ref 0.00–1.49)
Prothrombin Time: 13.9 seconds (ref 11.6–15.2)

## 2011-08-28 MED ORDER — CEFAZOLIN SODIUM 1 G IJ SOLR
2.0000 g | INTRAMUSCULAR | Status: AC
  Administered 2011-08-29: 2 g via INTRAVENOUS

## 2011-08-28 MED ORDER — DEXTROSE 5 % IV SOLN: 1.0000 g | INTRAVENOUS | Status: DC

## 2011-08-28 MED ORDER — DEXAMETHASONE SODIUM PHOSPHATE 10 MG/ML IJ SOLN: 10.0000 mg | INTRAMUSCULAR | Status: DC

## 2011-08-29 ENCOUNTER — Ambulatory Visit (HOSPITAL_COMMUNITY): Payer: Medicare Other

## 2011-08-29 ENCOUNTER — Encounter (HOSPITAL_COMMUNITY)
Admission: RE | Disposition: A | Discharge status: Home / Self Care | Payer: Self-pay | Source: Ambulatory Visit | Attending: Neurological Surgery

## 2011-08-29 ENCOUNTER — Encounter (HOSPITAL_COMMUNITY): Payer: Self-pay | Admitting: Anesthesiology

## 2011-08-29 ENCOUNTER — Inpatient Hospital Stay (HOSPITAL_COMMUNITY)
Admission: RE | Admit: 2011-08-29 | Discharge: 2011-08-29 | DRG: 491 | Disposition: A | Discharge status: Home / Self Care | Payer: Medicare Other | Source: Ambulatory Visit | Attending: Neurological Surgery | Admitting: Neurological Surgery

## 2011-08-29 ENCOUNTER — Encounter (HOSPITAL_COMMUNITY): Payer: Self-pay | Admitting: Neurological Surgery

## 2011-08-29 ENCOUNTER — Ambulatory Visit (HOSPITAL_COMMUNITY): Payer: Medicare Other | Admitting: Anesthesiology

## 2011-08-29 DIAGNOSIS — Z87891 Personal history of nicotine dependence: Secondary | ICD-10-CM

## 2011-08-29 DIAGNOSIS — Z8249 Family history of ischemic heart disease and other diseases of the circulatory system: Secondary | ICD-10-CM

## 2011-08-29 DIAGNOSIS — Z79899 Other long term (current) drug therapy: Secondary | ICD-10-CM

## 2011-08-29 DIAGNOSIS — Z9889 Other specified postprocedural states: Secondary | ICD-10-CM

## 2011-08-29 DIAGNOSIS — M810 Age-related osteoporosis without current pathological fracture: Secondary | ICD-10-CM | POA: Diagnosis present

## 2011-08-29 DIAGNOSIS — E7800 Pure hypercholesterolemia, unspecified: Secondary | ICD-10-CM | POA: Diagnosis present

## 2011-08-29 DIAGNOSIS — Z96659 Presence of unspecified artificial knee joint: Secondary | ICD-10-CM

## 2011-08-29 DIAGNOSIS — F411 Generalized anxiety disorder: Secondary | ICD-10-CM | POA: Diagnosis present

## 2011-08-29 DIAGNOSIS — M48061 Spinal stenosis, lumbar region without neurogenic claudication: Principal | ICD-10-CM | POA: Diagnosis present

## 2011-08-29 DIAGNOSIS — K219 Gastro-esophageal reflux disease without esophagitis: Secondary | ICD-10-CM | POA: Diagnosis present

## 2011-08-29 DIAGNOSIS — F329 Major depressive disorder, single episode, unspecified: Secondary | ICD-10-CM | POA: Diagnosis present

## 2011-08-29 DIAGNOSIS — F429 Obsessive-compulsive disorder, unspecified: Secondary | ICD-10-CM | POA: Diagnosis present

## 2011-08-29 DIAGNOSIS — F3289 Other specified depressive episodes: Secondary | ICD-10-CM | POA: Diagnosis present

## 2011-08-29 HISTORY — PX: LUMBAR LAMINECTOMY/DECOMPRESSION MICRODISCECTOMY: SHX5026

## 2011-08-29 SURGERY — LUMBAR LAMINECTOMY/DECOMPRESSION MICRODISCECTOMY 2 LEVELS
Anesthesia: General | Site: Back | Wound class: Clean

## 2011-08-29 MED ORDER — PHENOL 1.4 % MT LIQD: 1.0000 | OROMUCOSAL | Status: DC | PRN

## 2011-08-29 MED ORDER — ZOLPIDEM TARTRATE 5 MG PO TABS: 5.0000 mg | ORAL_TABLET | Freq: Every evening | ORAL | Status: DC | PRN

## 2011-08-29 MED ORDER — PAROXETINE HCL 20 MG PO TABS: 40.0000 mg | ORAL_TABLET | Freq: Every day | ORAL | Status: DC

## 2011-08-29 MED ORDER — SODIUM CHLORIDE 0.9 % IR SOLN
Status: DC | PRN
  Administered 2011-08-29: 09:00:00

## 2011-08-29 MED ORDER — BACITRACIN 50000 UNITS IM SOLR
INTRAMUSCULAR | Status: AC
  Filled 2011-08-29: qty 1

## 2011-08-29 MED ORDER — LIDOCAINE HCL (CARDIAC) 20 MG/ML IV SOLN
INTRAVENOUS | Status: DC | PRN
  Administered 2011-08-29: 80 mg via INTRAVENOUS

## 2011-08-29 MED ORDER — ROCURONIUM BROMIDE 100 MG/10ML IV SOLN
INTRAVENOUS | Status: DC | PRN
  Administered 2011-08-29: 50 mg via INTRAVENOUS

## 2011-08-29 MED ORDER — CEFAZOLIN SODIUM-DEXTROSE 2-3 GM-% IV SOLR
INTRAVENOUS | Status: AC
  Filled 2011-08-29: qty 50

## 2011-08-29 MED ORDER — THROMBIN 5000 UNITS EX SOLR
CUTANEOUS | Status: DC | PRN
  Administered 2011-08-29 (×2): 5000 [IU] via TOPICAL

## 2011-08-29 MED ORDER — SENNA 8.6 MG PO TABS
1.0000 | ORAL_TABLET | Freq: Two times a day (BID) | ORAL | Status: DC
  Administered 2011-08-29: 8.6 mg via ORAL
  Filled 2011-08-29: qty 1

## 2011-08-29 MED ORDER — DEXAMETHASONE SODIUM PHOSPHATE 10 MG/ML IJ SOLN
INTRAMUSCULAR | Status: AC
  Administered 2011-08-29: 10 mg via INTRAVENOUS
  Filled 2011-08-29: qty 1

## 2011-08-29 MED ORDER — LACTATED RINGERS IV SOLN
INTRAVENOUS | Status: DC | PRN
  Administered 2011-08-29 (×2): via INTRAVENOUS

## 2011-08-29 MED ORDER — MENTHOL 3 MG MT LOZG: 1.0000 | LOZENGE | OROMUCOSAL | Status: DC | PRN

## 2011-08-29 MED ORDER — TAMSULOSIN HCL 0.4 MG PO CAPS
0.4000 mg | ORAL_CAPSULE | Freq: Every day | ORAL | Status: DC
  Administered 2011-08-29: 0.4 mg via ORAL
  Filled 2011-08-29: qty 1

## 2011-08-29 MED ORDER — TRAMADOL HCL 50 MG PO TABS
50.0000 mg | ORAL_TABLET | Freq: Four times a day (QID) | ORAL | Status: DC | PRN
  Administered 2011-08-29 (×2): 50 mg via ORAL
  Filled 2011-08-29 (×2): qty 1

## 2011-08-29 MED ORDER — 0.9 % SODIUM CHLORIDE (POUR BTL) OPTIME
TOPICAL | Status: DC | PRN
  Administered 2011-08-29: 1000 mL

## 2011-08-29 MED ORDER — ONDANSETRON HCL 4 MG/2ML IJ SOLN
INTRAMUSCULAR | Status: DC | PRN
  Administered 2011-08-29: 4 mg via INTRAVENOUS

## 2011-08-29 MED ORDER — SODIUM CHLORIDE 0.9 % IJ SOLN: 3.0000 mL | Freq: Two times a day (BID) | INTRAMUSCULAR | Status: DC

## 2011-08-29 MED ORDER — HEMOSTATIC AGENTS (NO CHARGE) OPTIME
TOPICAL | Status: DC | PRN
  Administered 2011-08-29: 1 via TOPICAL

## 2011-08-29 MED ORDER — PHENYLEPHRINE HCL 10 MG/ML IJ SOLN
INTRAMUSCULAR | Status: DC | PRN
  Administered 2011-08-29 (×5): 40 ug via INTRAVENOUS

## 2011-08-29 MED ORDER — HYDROMORPHONE HCL PF 1 MG/ML IJ SOLN: 0.2500 mg | INTRAMUSCULAR | Status: DC | PRN

## 2011-08-29 MED ORDER — ACETAMINOPHEN 325 MG PO TABS: 650.0000 mg | ORAL_TABLET | ORAL | Status: DC | PRN

## 2011-08-29 MED ORDER — PROPOFOL 10 MG/ML IV EMUL: INTRAVENOUS | Status: DC | PRN

## 2011-08-29 MED ORDER — SODIUM CHLORIDE 0.9 % IV SOLN: 250.0000 mL | INTRAVENOUS | Status: DC

## 2011-08-29 MED ORDER — MORPHINE SULFATE 2 MG/ML IJ SOLN: 1.0000 mg | INTRAMUSCULAR | Status: DC | PRN

## 2011-08-29 MED ORDER — ACETAMINOPHEN 650 MG RE SUPP: 650.0000 mg | RECTAL | Status: DC | PRN

## 2011-08-29 MED ORDER — SODIUM CHLORIDE 0.9 % IJ SOLN: 3.0000 mL | INTRAMUSCULAR | Status: DC | PRN

## 2011-08-29 MED ORDER — POTASSIUM CHLORIDE IN NACL 20-0.9 MEQ/L-% IV SOLN
INTRAVENOUS | Status: DC
  Administered 2011-08-29: 13:00:00 via INTRAVENOUS
  Filled 2011-08-29 (×3): qty 1000

## 2011-08-29 MED ORDER — NEOSTIGMINE METHYLSULFATE 1 MG/ML IJ SOLN
INTRAMUSCULAR | Status: DC | PRN
  Administered 2011-08-29: 4 mg via INTRAVENOUS

## 2011-08-29 MED ORDER — ONDANSETRON HCL 4 MG/2ML IJ SOLN: 4.0000 mg | INTRAMUSCULAR | Status: DC | PRN

## 2011-08-29 MED ORDER — BUPIVACAINE HCL (PF) 0.25 % IJ SOLN
INTRAMUSCULAR | Status: DC | PRN
  Administered 2011-08-29: 4 mL
  Administered 2011-08-29: 10 mL

## 2011-08-29 MED ORDER — TRAMADOL HCL 50 MG PO TABS
50.0000 mg | ORAL_TABLET | Freq: Four times a day (QID) | ORAL | Status: DC | PRN
Start: 2011-08-29 — End: 2012-05-01

## 2011-08-29 MED ORDER — ONDANSETRON HCL 4 MG/2ML IJ SOLN: 4.0000 mg | Freq: Once | INTRAMUSCULAR | Status: DC | PRN

## 2011-08-29 MED ORDER — SODIUM CHLORIDE 0.9 % IV SOLN
INTRAVENOUS | Status: AC
  Filled 2011-08-29: qty 500

## 2011-08-29 MED ORDER — DEXTROSE 5 % IV SOLN
INTRAVENOUS | Status: DC | PRN
  Administered 2011-08-29: 09:00:00 via INTRAVENOUS

## 2011-08-29 MED ORDER — CEFAZOLIN SODIUM 1-5 GM-% IV SOLN
1.0000 g | Freq: Three times a day (TID) | INTRAVENOUS | Status: DC
  Administered 2011-08-29: 1 g via INTRAVENOUS
  Filled 2011-08-29 (×2): qty 50

## 2011-08-29 MED ORDER — GLYCOPYRROLATE 0.2 MG/ML IJ SOLN
INTRAMUSCULAR | Status: DC | PRN
  Administered 2011-08-29: 0.6 mg via INTRAVENOUS

## 2011-08-29 MED ORDER — PANTOPRAZOLE SODIUM 40 MG PO TBEC
40.0000 mg | DELAYED_RELEASE_TABLET | Freq: Every day | ORAL | Status: DC
  Administered 2011-08-29: 40 mg via ORAL
  Filled 2011-08-29: qty 1

## 2011-08-29 MED ORDER — FENTANYL CITRATE 0.05 MG/ML IJ SOLN
INTRAMUSCULAR | Status: DC | PRN
  Administered 2011-08-29 (×3): 50 ug via INTRAVENOUS

## 2011-08-29 MED ORDER — PROPOFOL 10 MG/ML IV EMUL
INTRAVENOUS | Status: DC | PRN
  Administered 2011-08-29: 150 mg via INTRAVENOUS

## 2011-08-29 SURGICAL SUPPLY — 48 items
APL SKNCLS STERI-STRIP NONHPOA (GAUZE/BANDAGES/DRESSINGS) ×1
BAG DECANTER FOR FLEXI CONT (MISCELLANEOUS) ×2 IMPLANT
BENZOIN TINCTURE PRP APPL 2/3 (GAUZE/BANDAGES/DRESSINGS) ×2 IMPLANT
BUR MATCHSTICK NEURO 3.0 LAGG (BURR) ×2 IMPLANT
CANISTER SUCTION 2500CC (MISCELLANEOUS) ×2 IMPLANT
CLOTH BEACON ORANGE TIMEOUT ST (SAFETY) ×2 IMPLANT
CONT SPEC 4OZ CLIKSEAL STRL BL (MISCELLANEOUS) ×2 IMPLANT
DRAPE LAPAROTOMY 100X72X124 (DRAPES) ×2 IMPLANT
DRAPE MICROSCOPE ZEISS OPMI (DRAPES) ×1 IMPLANT
DRAPE POUCH INSTRU U-SHP 10X18 (DRAPES) ×2 IMPLANT
DRAPE SURG 17X23 STRL (DRAPES) ×2 IMPLANT
DRESSING TELFA 8X3 (GAUZE/BANDAGES/DRESSINGS) ×2 IMPLANT
DRSG OPSITE 4X5.5 SM (GAUZE/BANDAGES/DRESSINGS) ×2 IMPLANT
DURAPREP 26ML APPLICATOR (WOUND CARE) ×2 IMPLANT
ELECT REM PT RETURN 9FT ADLT (ELECTROSURGICAL) ×2
ELECTRODE REM PT RTRN 9FT ADLT (ELECTROSURGICAL) ×1 IMPLANT
GAUZE SPONGE 4X4 16PLY XRAY LF (GAUZE/BANDAGES/DRESSINGS) IMPLANT
GLOVE BIO SURGEON STRL SZ8 (GLOVE) ×2 IMPLANT
GLOVE BIO SURGEON STRL SZ8.5 (GLOVE) ×1 IMPLANT
GLOVE BIOGEL PI IND STRL 6.5 (GLOVE) IMPLANT
GLOVE BIOGEL PI INDICATOR 6.5 (GLOVE) ×2
GLOVE OPTIFIT SS 6.5 STRL BRWN (GLOVE) ×2 IMPLANT
GLOVE SS BIOGEL STRL SZ 8 (GLOVE) IMPLANT
GLOVE SUPERSENSE BIOGEL SZ 8 (GLOVE) ×1
GOWN BRE IMP SLV AUR LG STRL (GOWN DISPOSABLE) ×1 IMPLANT
GOWN BRE IMP SLV AUR XL STRL (GOWN DISPOSABLE) ×3 IMPLANT
GOWN STRL REIN 2XL LVL4 (GOWN DISPOSABLE) IMPLANT
HEMOSTAT POWDER KIT SURGIFOAM (HEMOSTASIS) IMPLANT
KIT BASIN OR (CUSTOM PROCEDURE TRAY) ×2 IMPLANT
KIT ROOM TURNOVER OR (KITS) ×2 IMPLANT
NDL HYPO 25X1 1.5 SAFETY (NEEDLE) ×1 IMPLANT
NDL SPNL 20GX3.5 QUINCKE YW (NEEDLE) IMPLANT
NEEDLE HYPO 25X1 1.5 SAFETY (NEEDLE) ×2 IMPLANT
NEEDLE SPNL 20GX3.5 QUINCKE YW (NEEDLE) ×2 IMPLANT
NS IRRIG 1000ML POUR BTL (IV SOLUTION) ×2 IMPLANT
PACK LAMINECTOMY NEURO (CUSTOM PROCEDURE TRAY) ×2 IMPLANT
PAD ARMBOARD 7.5X6 YLW CONV (MISCELLANEOUS) ×6 IMPLANT
RUBBERBAND STERILE (MISCELLANEOUS) ×2 IMPLANT
SPONGE SURGIFOAM ABS GEL SZ50 (HEMOSTASIS) ×2 IMPLANT
STRIP CLOSURE SKIN 1/2X4 (GAUZE/BANDAGES/DRESSINGS) ×2 IMPLANT
SUT VIC AB 0 CT1 18XCR BRD8 (SUTURE) ×1 IMPLANT
SUT VIC AB 0 CT1 8-18 (SUTURE) ×2
SUT VIC AB 2-0 CP2 18 (SUTURE) ×2 IMPLANT
SUT VIC AB 3-0 SH 8-18 (SUTURE) ×2 IMPLANT
SYR 20ML ECCENTRIC (SYRINGE) ×2 IMPLANT
TOWEL OR 17X24 6PK STRL BLUE (TOWEL DISPOSABLE) ×2 IMPLANT
TOWEL OR 17X26 10 PK STRL BLUE (TOWEL DISPOSABLE) ×2 IMPLANT
WATER STERILE IRR 1000ML POUR (IV SOLUTION) ×2 IMPLANT

## 2011-08-29 NOTE — Anesthesia Postprocedure Evaluation (Signed)
  Anesthesia Post-op Note  Patient: Timothy Osborn  Procedure(s) Performed: Procedure(s) (LRB): LUMBAR LAMINECTOMY/DECOMPRESSION MICRODISCECTOMY 2 LEVELS (N/A)  Patient Location: PACU  Anesthesia Type: General  Level of Consciousness: awake, alert  and oriented  Airway and Oxygen Therapy: Patient Spontanous Breathing and Patient connected to nasal cannula oxygen  Post-op Pain: mild  Post-op Assessment: Post-op Vital signs reviewed  Post-op Vital Signs: Reviewed  Complications: No apparent anesthesia complications

## 2011-08-29 NOTE — Anesthesia Preprocedure Evaluation (Addendum)
Anesthesia Evaluation  Patient identified by MRN, date of birth, ID band Patient awake    Reviewed: Allergy & Precautions, H&P , NPO status , Patient's Chart, lab work & pertinent test results, reviewed documented beta blocker date and time   Airway Mallampati: I TM Distance: >3 FB Neck ROM: Full    Dental  (+) Teeth Intact, Dental Advisory Given and Missing   Pulmonary  breath sounds clear to auscultation        Cardiovascular hypertension, Pt. on medications Rhythm:Regular Rate:Normal     Neuro/Psych    GI/Hepatic GERD-  Medicated and Controlled,  Endo/Other    Renal/GU      Musculoskeletal   Abdominal   Peds  Hematology   Anesthesia Other Findings Lower on sides missing...  Reproductive/Obstetrics                         Anesthesia Physical Anesthesia Plan  ASA: I  Anesthesia Plan: General   Post-op Pain Management:    Induction: Intravenous  Airway Management Planned: Oral ETT  Additional Equipment:   Intra-op Plan:   Post-operative Plan: Extubation in OR  Informed Consent: I have reviewed the patients History and Physical, chart, labs and discussed the procedure including the risks, benefits and alternatives for the proposed anesthesia with the patient or authorized representative who has indicated his/her understanding and acceptance.   Dental advisory given  Plan Discussed with: CRNA, Anesthesiologist and Surgeon  Anesthesia Plan Comments:         Anesthesia Quick Evaluation

## 2011-08-29 NOTE — H&P (Signed)
Subjective: Patient is a 78 y.o. male admitted for DLL L3-4 L4-5. Onset of symptoms was several months ago, slowly progressively worse since that time.  The pain is rated moderate, and is located at the lower back and radiates to legs. He has difficulty with gait, and his legs "buckle." The pain is described as aching and occurs with walking. The symptoms have been progressive. Symptoms are exacerbated by walking. MRI or CT showed stenosis L3-4 and L4-5.  Past Medical History  Diagnosis Date  . GERD (gastroesophageal reflux disease)   . Anxiety   . Hypercholesterolemia   . Depression     when he retired.....  Marland Kitchen Arthritis   . Osteoporosis     /OSTEOPENIA  . OCD (obsessive compulsive disorder)     "MILD CASE"  . Bronchitis     hx of  . Cataract   . Osteopenia     Past Surgical History  Procedure Date  . Tonsillectomy   . Hernia repair     left  . Total knee arthroplasty 12/18/2010    Procedure: TOTAL KNEE ARTHROPLASTY;  Surgeon: Nilda Simmer, MD;  Location: [facility] OR;  Service: Orthopedics;  Laterality: Left;  ARTHROPLASTY KNEE TOTAL LEFT SIDE  . Colonoscopy     Prior to Admission medications   Medication Sig Start Date End Date Taking? Authorizing Provider  alendronate (FOSAMAX) 70 MG tablet Take 70 mg by mouth every 7 (seven) days. Take with a full glass of water on an empty stomach. Take on Fridays   Yes Historical Provider, MD  calcium-vitamin D (OSCAL WITH D) 500-200 MG-UNIT per tablet Take 2 tablets by mouth daily.    Yes Ralene Cork, DO  multivitamin-iron-minerals-folic acid (CENTRUM) chewable tablet Chew 1 tablet by mouth daily.     Yes Katy Apo, MD  omeprazole (PRILOSEC) 40 MG capsule Take 40 mg by mouth daily.     Yes Katy Apo, MD  PARoxetine (PAXIL) 40 MG tablet Take 40 mg by mouth every morning.     Yes Katy Apo, MD  Polyethyl Glycol-Propyl Glycol (SYSTANE OP) Apply 1 drop to eye daily as needed. For dry eyes   Yes Historical Provider, MD    simvastatin (ZOCOR) 20 MG tablet Take 20 mg by mouth at bedtime.     Yes Katy Apo, MD  Thiamine HCl (VITAMIN B-1) 100 MG tablet Take 100 mg by mouth daily.     Yes Katy Apo, MD  traMADol (ULTRAM) 50 MG tablet Take 50 mg by mouth every 6 (six) hours as needed.    Historical Provider, MD   Allergies  Allergen Reactions  . Prozac (Fluoxetine Hcl) Other (See Comments)    "Jittery"  . Vicodin (Hydrocodone-Acetaminophen) Other (See Comments)    Hallicunations    History  Substance Use Topics  . Smoking status: Former Smoker -- 1.0 packs/day for 8 years    Types: Pipe    Quit date: 12/05/1986  . Smokeless tobacco: Former Neurosurgeon    Quit date: 07/12/1986  . Alcohol Use: 3.6 oz/week    6 Glasses of wine per week    Family History  Problem Relation Age of Onset  . Heart failure Father   . Heart attack Brother      Review of Systems  Positive ROS: neg  All other systems have been reviewed and were otherwise negative with the exception of those mentioned in the HPI and as above.  Objective: Vital signs in last 24 hours: Temp:  [  97.7 F (36.5 C)] 97.7 F (36.5 C) (07/31 0637) Pulse Rate:  [73] 73  (07/31 0637) Resp:  [18] 18  (07/31 0637) BP: (127)/(77) 127/77 mmHg (07/31 0637) SpO2:  [96 %] 96 % (07/31 0637)  General Appearance: Alert, cooperative, no distress, appears stated age Head: Normocephalic, without obvious abnormality, atraumatic Eyes: PERRL, conjunctiva/corneas clear, EOM's intact     Ears: Normal TM's and external ear canals, both ears Throat: Lips, mucosa, and tongue normal; teeth and gums normal Neck: Supple, symmetrical, trachea midline, no adenopathy; thyroid: No enlargement/tenderness/nodules; no carotid bruit or JVD Back: Symmetric, no curvature, ROM normal, no CVA tenderness Lungs: Clear to auscultation bilaterally, respirations unlabored Heart: Regular rate and rhythm, S1 and S2 normal, no murmur, rub or gallop Abdomen: Soft,  non-tender Extremities: Extremities normal, atraumatic, no cyanosis or edema Pulses: 2+ and symmetric all extremities Skin: Skin color, texture, turgor normal, no rashes or lesions  NEUROLOGIC:   Mental status: Alert and oriented x4,  no aphasia, good attention span, fund of knowledge, and memory Motor Exam - grossly normal Sensory Exam - grossly normal Reflexes: 1+ Coordination - grossly normal Gait - grossly normal Balance - grossly normal Cranial Nerves: I: smell Not tested  II: visual acuity  OS: na   OD: na  II: visual fields Not tested  II: pupils Equal, irreg  III,VII: ptosis None  III,IV,VI: extraocular muscles  Full ROM  V: mastication Normal  V: facial light touch sensation  Normal  V,VII: corneal reflex  Present  VII: facial muscle function - upper  Normal  VII: facial muscle function - lower Normal  VIII: hearing Not tested  IX: soft palate elevation  Normal  IX,X: gag reflex Present  XI: trapezius strength  5/5  XI: sternocleidomastoid strength 5/5  XI: neck flexion strength  5/5  XII: tongue strength  Normal    Data Review Lab Results  Component Value Date   WBC 5.3 08/21/2011   HGB 14.3 08/21/2011   HCT 42.4 08/21/2011   MCV 91.4 08/21/2011   PLT 271 08/21/2011   Lab Results  Component Value Date   NA 141 08/21/2011   K 4.0 08/21/2011   CL 105 08/21/2011   CO2 29 08/21/2011   BUN 20 08/21/2011   CREATININE 0.85 08/21/2011   GLUCOSE 84 08/21/2011   Lab Results  Component Value Date   INR 1.05 08/21/2011    Assessment/Plan: Patient admitted for [facility] for spinal stenosis. Patient has failed conservative therapy.  I explained the condition and procedure to the patient and answered any questions.  Patient wishes to proceed with procedure as planned. Understands risks/ benefits and typical outcomes of procedure.   Malesha Suliman S 08/29/2011 7:01 AM

## 2011-08-29 NOTE — Op Note (Signed)
08/29/2011  10:04 AM  PATIENT:  Timothy Osborn  77 y.o. male  PRE-OPERATIVE DIAGNOSIS:  Lumbar spinal stenosis  POST-OPERATIVE DIAGNOSIS:  Same  PROCEDURE:  Decompressive lumbar laminectomy, facetectomy and foraminotomies L3-4 L4-5 bilaterally  SURGEON:  Marikay Alar, MD  ASSISTANTS: None  ANESTHESIA:   General  EBL: 50 ml  Total I/O In: 1050 [I.V.:1050] Out: 100 [Blood:100]  BLOOD ADMINISTERED:none  DRAINS: None   SPECIMEN:  No Specimen  INDICATION FOR PROCEDURE: This patient presented with back pain and bilateral leg pain with ambulation consistent with claudication. MRI showed severe spinal stenosis. Recommended decompressive laminectomy. Patient understood the risks, benefits, and alternatives and potential outcomes and wished to proceed.  PROCEDURE DETAILS: The patient was taken to the operating room and after induction of adequate generalized endotracheal anesthesia, the patient was rolled into the prone position on the Wilson frame and all pressure points were padded. The lumbar region was cleaned and then prepped with DuraPrep and draped in the usual sterile fashion. 5 cc of local anesthesia was injected and then a dorsal midline incision was made and carried down to the lumbo sacral fascia. The fascia was opened and the paraspinous musculature was taken down in a subperiosteal fashion to expose L3-4 and L4-5 bilaterally. Intraoperative x-ray confirmed my level, and then I used a combination of the high-speed drill and the Kerrison punches to perform a complete laminectomy, medial facetectomy, and foraminotomy at L45 bilaterally. I removed the inferior half of the spinous process of L3 and undercut the L3-4 interspace to the disc level. The underlying yellow ligament was opened and removed in a piecemeal fashion to expose the underlying dura and exiting nerve roots. Both the L4 and L5 nerve roots were decompressed. I undercut the lateral recess and dissected down until I was  medial to and distal to the pedicle of L4 and L5. The nerve root was well decompressed at both levels. I inspected for disc herniation L4-5 and found none. I then palpated with a coronary dilator along the nerve roots and into the foramen to assure adequate decompression. I felt no more compression of the nerve roots. I irrigated with saline solution containing bacitracin. Achieved hemostasis with bipolar cautery, lined the dura with Gelfoam, and then closed the fascia with 0 Vicryl. I closed the subcutaneous tissues with 2-0 Vicryl and the subcuticular tissues with 3-0 Vicryl. The skin was then closed with benzoin and Steri-Strips. The drapes were removed, a sterile dressing was applied. The patient was awakened from general anesthesia and transferred to the recovery room in stable condition. At the end of the procedure all sponge, needle and instrument counts were correct.  PLAN OF CARE: Admit to inpatient   PATIENT DISPOSITION:  PACU - hemodynamically stable.   Delay start of Pharmacological VTE agent (>24hrs) due to surgical blood loss or risk of bleeding:  yes

## 2011-08-29 NOTE — Preoperative (Signed)
Beta Blockers   Reason not to administer Beta Blockers:Not Applicable 

## 2011-08-29 NOTE — Transfer of Care (Signed)
Immediate Anesthesia Transfer of Care Note  Patient: Timothy Osborn  Procedure(s) Performed: Procedure(s) (LRB): LUMBAR LAMINECTOMY/DECOMPRESSION MICRODISCECTOMY 2 LEVELS (N/A)  Patient Location: PACU  Anesthesia Type: General  Level of Consciousness: awake, alert  and patient cooperative  Airway & Oxygen Therapy: Patient Spontanous Breathing and Patient connected to face mask oxygen  Post-op Assessment: Report given to PACU RN, Post -op Vital signs reviewed and stable and Patient moving all extremities  Post vital signs: Reviewed and stable  Complications: No apparent anesthesia complications

## 2011-08-29 NOTE — Anesthesia Procedure Notes (Signed)
Procedure Name: Intubation Date/Time: 08/29/2011 8:45 AM Performed by: Darcey Nora B Pre-anesthesia Checklist: Patient identified, Emergency Drugs available, Suction available and Patient being monitored Patient Re-evaluated:Patient Re-evaluated prior to inductionOxygen Delivery Method: Circle system utilized Preoxygenation: Pre-oxygenation with 100% oxygen Intubation Type: IV induction Ventilation: Mask ventilation without difficulty Laryngoscope Size: Mac and 4 Grade View: Grade I Tube type: Oral Tube size: 7.5 mm Number of attempts: 1 Airway Equipment and Method: Stylet Placement Confirmation: ETT inserted through vocal cords under direct vision,  breath sounds checked- equal and bilateral and positive ETCO2 Secured at: 23 (cm at teeth) cm Tube secured with: Tape Dental Injury: Teeth and Oropharynx as per pre-operative assessment

## 2011-08-29 NOTE — Discharge Summary (Signed)
Physician Discharge Summary  Patient ID: Timothy Osborn MRN: [id number] DOB/AGE: 1928-11-05 78 y.o.  Admit date: 08/29/2011 Discharge date: 08/29/2011  Admission Diagnoses: spinal stenosis    Discharge Diagnoses: same   Discharged Condition: good  Hospital Course: The patient was admitted on 08/29/2011 and taken to the operating room where the patient underwent DLL L3-4, L4-5. The patient tolerated the procedure well and was taken to the recovery room and then to the floor in stable condition. The hospital course was routine. There were no complications. The wound remained clean dry and intact. Pt had appropriate back soreness. No complaints of leg pain or new N/T/W. The patient remained afebrile with stable vital signs, and tolerated a regular diet. The patient continued to increase activities, and pain was well controlled with oral pain medications.   Consults: None  Significant Diagnostic Studies:  Results for orders placed during the hospital encounter of 08/21/11  BASIC METABOLIC PANEL      Component Value Range   Sodium 141  135 - 145 mEq/L   Potassium 4.0  3.5 - 5.1 mEq/L   Chloride 105  96 - 112 mEq/L   CO2 29  19 - 32 mEq/L   Glucose, Bld 84  70 - 99 mg/dL   BUN 20  6 - 23 mg/dL   Creatinine, Ser 4.09  0.50 - 1.35 mg/dL   Calcium 9.0  8.4 - 81.1 mg/dL   GFR calc non Af Amer 80 (*) >90 mL/min   GFR calc Af Amer >90  >90 mL/min  CBC WITH DIFFERENTIAL      Component Value Range   WBC 5.3  4.0 - 10.5 K/uL   RBC 4.64  4.22 - 5.81 MIL/uL   Hemoglobin 14.3  13.0 - 17.0 g/dL   HCT 91.4  78.2 - 95.6 %   MCV 91.4  78.0 - 100.0 fL   MCH 30.8  26.0 - 34.0 pg   MCHC 33.7  30.0 - 36.0 g/dL   RDW 21.3  08.6 - 57.8 %   Platelets 271  150 - 400 K/uL   Neutrophils Relative 50  43 - 77 %   Neutro Abs 2.7  1.7 - 7.7 K/uL   Lymphocytes Relative 35  12 - 46 %   Lymphs Abs 1.9  0.7 - 4.0 K/uL   Monocytes Relative 11  3 - 12 %   Monocytes Absolute 0.6  0.1 - 1.0 K/uL   Eosinophils  Relative 3  0 - 5 %   Eosinophils Absolute 0.2  0.0 - 0.7 K/uL   Basophils Relative 1  0 - 1 %   Basophils Absolute 0.0  0.0 - 0.1 K/uL  PROTIME-INR      Component Value Range   Prothrombin Time 13.9  11.6 - 15.2 seconds   INR 1.05  0.00 - 1.49  SURGICAL PCR SCREEN      Component Value Range   MRSA, PCR NEGATIVE  NEGATIVE   Staphylococcus aureus NEGATIVE  NEGATIVE    Dg Lumbar Spine 1 View  08/29/2011  *RADIOLOGY REPORT*  Clinical Data: L3-L5 laminectomy.  LUMBAR SPINE - 1 VIEW  Comparison: MRI 07/06/2011  Findings: Posterior surgical instruments are in place.  Localizing instrument is directed at the L3-4 level.  IMPRESSION: Intraoperative localization as above.  Original Report Authenticated By: Cyndie Chime, M.D.    Antibiotics:  Anti-infectives     Start     Dose/Rate Route Frequency Ordered Stop   08/29/11 1640   ceFAZolin (ANCEF)  IVPB 1 g/50 mL premix        1 g 100 mL/hr over 30 Minutes Intravenous Every 8 hours 08/29/11 1052 08/30/11 0844   08/29/11 0914   bacitracin 50,000 Units in sodium chloride irrigation 0.9 % 500 mL irrigation  Status:  Discontinued          As needed 08/29/11 0914 08/29/11 1005   08/29/11 0801   bacitracin 04540 UNITS injection     Comments: KEY, JENNIFER: cabinet override         08/29/11 0801 08/29/11 2014   08/29/11 0700   ceFAZolin (ANCEF) 2-3 GM-% IVPB SOLR     Comments: MICHAEL, CYNTHIA: cabinet override         08/29/11 0700 08/29/11 1859   08/29/11 0000   ceFAZolin (ANCEF) 1 g in dextrose 5 % 50 mL IVPB  Status:  Discontinued        1 g 120 mL/hr over 30 Minutes Intravenous 60 min pre-op 08/28/11 0840 08/28/11 0841   08/29/11 0000   ceFAZolin (ANCEF) 2 g in dextrose 5 % 50 mL IVPB        2 g 140 mL/hr over 30 Minutes Intravenous 60 min pre-op 08/28/11 0841 08/29/11 0840          Discharge Exam: Blood pressure 114/73, pulse 101, temperature 98.5 F (36.9 C), temperature source Oral, resp. rate 18, SpO2 92.00%. Neurologic:  Grossly normal Incision CDI  Discharge Medications:   Medication List  As of 08/29/2011  5:46 PM   TAKE these medications         alendronate 70 MG tablet   Commonly known as: FOSAMAX   Take 70 mg by mouth every 7 (seven) days. Take with a full glass of water on an empty stomach. Take on Fridays      calcium-vitamin D 500-200 MG-UNIT per tablet   Commonly known as: OSCAL WITH D   Take 2 tablets by mouth daily.      multivitamin-iron-minerals-folic acid chewable tablet   Chew 1 tablet by mouth daily.      omeprazole 40 MG capsule   Commonly known as: PRILOSEC   Take 40 mg by mouth daily.      PARoxetine 40 MG tablet   Commonly known as: PAXIL   Take 40 mg by mouth every morning.      simvastatin 20 MG tablet   Commonly known as: ZOCOR   Take 20 mg by mouth at bedtime.      SYSTANE OP   Apply 1 drop to eye daily as needed. For dry eyes      thiamine 100 MG tablet   Commonly known as: VITAMIN B-1   Take 100 mg by mouth daily.      traMADol 50 MG tablet   Commonly known as: ULTRAM   Take 1 tablet (50 mg total) by mouth every 6 (six) hours as needed for pain.            Disposition: home   Final Dx: DLL L3-4, L4-5  Discharge Orders    Future Orders Please Complete By Expires   Diet - low sodium heart healthy      Increase activity slowly      Remove dressing in 48 hours      Call MD for:  temperature >100.4      Call MD for:  persistant nausea and vomiting      Call MD for:  severe uncontrolled pain      Call MD  for:  redness, tenderness, or signs of infection (pain, swelling, redness, odor or green/yellow discharge around incision site)      Call MD for:  difficulty breathing, headache or visual disturbances      Driving Restrictions      Comments:   1 week   Lifting restrictions      Comments:   Less than 10 lbs      Follow-up Information    Follow up with JONES,DAVID S, MD. Schedule an appointment as soon as possible for a visit in 2 weeks.    Contact information:   [address], [address] [place] [address] [phone]           Signed: Tia Alert 08/29/2011, 5:46 PM

## 2011-08-29 NOTE — Transfer of Care (Signed)
Immediate Anesthesia Transfer of Care Note  Patient: Timothy Osborn  Procedure(s) Performed: Procedure(s) (LRB): LUMBAR LAMINECTOMY/DECOMPRESSION MICRODISCECTOMY 2 LEVELS (N/A)  Patient Location: PACU  Anesthesia Type: General  Level of Consciousness: awake, alert , oriented and patient cooperative  Airway & Oxygen Therapy: Patient Spontanous Breathing and Patient connected to face mask oxygen  Post-op Assessment: Report given to PACU RN, Post -op Vital signs reviewed and stable and Patient moving all extremities  Post vital signs: Reviewed and stable  Complications: No apparent anesthesia complications

## 2011-08-29 NOTE — Progress Notes (Signed)
Pt given D/C instructions with Rx. Pt verbalized understanding of D/C instructions. Pt D/C'd home via wheelchair @ 1815 per MD order. Rema Fendt, RN

## 2011-08-30 ENCOUNTER — Encounter (HOSPITAL_COMMUNITY): Payer: Self-pay | Admitting: Neurological Surgery

## 2012-03-04 ENCOUNTER — Other Ambulatory Visit: Payer: Self-pay | Admitting: Neurological Surgery

## 2012-03-04 DIAGNOSIS — M48061 Spinal stenosis, lumbar region without neurogenic claudication: Secondary | ICD-10-CM

## 2012-03-05 ENCOUNTER — Ambulatory Visit
Admission: RE | Admit: 2012-03-05 | Discharge: 2012-03-05 | Disposition: A | Discharge status: Home / Self Care | Payer: Medicare Other | Source: Ambulatory Visit | Attending: Neurological Surgery | Admitting: Neurological Surgery

## 2012-03-05 DIAGNOSIS — M48061 Spinal stenosis, lumbar region without neurogenic claudication: Secondary | ICD-10-CM

## 2012-03-05 MED ORDER — GADOBENATE DIMEGLUMINE 529 MG/ML IV SOLN: 19.0000 mL | Freq: Once | INTRAVENOUS | Status: AC | PRN

## 2012-04-21 ENCOUNTER — Other Ambulatory Visit: Payer: Self-pay | Admitting: Neurological Surgery

## 2012-04-30 ENCOUNTER — Other Ambulatory Visit: Payer: Self-pay | Admitting: Urology

## 2012-05-01 ENCOUNTER — Encounter (HOSPITAL_BASED_OUTPATIENT_CLINIC_OR_DEPARTMENT_OTHER): Payer: Self-pay | Admitting: *Deleted

## 2012-05-01 NOTE — Progress Notes (Signed)
NPO AFTER MN WITH EXCEPTION CLEAR LIQUIDS UNTIL 0730 (NO CREAM/ MILK PRODUCTS). ARRIVES AT 1230. NEEDS HG. WILL TAKE PRILOSEC AND PAXIL AM OF SURG W/ SIP OF WATER.

## 2012-05-05 NOTE — H&P (Signed)
History of Present Illness        F/u gross hematuria.  -Feb 2012 PSA 0.5 -Mar 2014 - gross hematuria referred by Dr. Windy Fast polite March 2014. Prostate nodule on DRE, PSA 0.5 -Mar 2014 CT - 10 mm right exophytic renal mass; 12 mm LLP stone; bladder mass - normal upper tracts  He is a former smoker and quit smoking in 1989. He has no chemo/XRT exposure. No dye/solvent exposure.   He has been told he had BPH, elevated PSA and had a biopsy of prostate about 20 yrs. He voids with a good flow and has no bothersome LUTS.   Nephrloithiasis -March 2014  - L. spine x-ray - 12 mm stone over the expected location of the left lower pole -Mar 2014 CT - 12 mm LLP stone   Interval history He returns to review CT scan in for cystoscopy. He has been well without chest pain or shortness of breath. He does need back surgery possible fusion.   Past Medical History Problems  1. History of  Anxiety (Symptom) 300.00 2. History of  Arthritis V13.4 3. History of  Esophageal Reflux 530.81  Surgical History Problems  1. History of  Cataract Surgery 2. History of  Knee Replacement 3. History of  Spine Repair  Current Meds 1. Caltrate 600 TABS; Therapy: (Recorded:18Mar2014) to 2. Centrum TABS; Therapy: (Recorded:18Mar2014) to 3. CoQ-10 100 MG Oral Capsule; Therapy: (Recorded:18Mar2014) to 4. Fosamax 70 MG Oral Tablet; Therapy: (Recorded:18Mar2014) to 5. Mobic 15 MG Oral Tablet; Therapy: (Recorded:18Mar2014) to 6. PARoxetine HCl 40 MG Oral Tablet; Therapy: (Recorded:18Mar2014) to 7. PriLOSEC 40 MG Oral Capsule Delayed Release; Therapy: (Recorded:18Mar2014) to 8. Simvastatin 20 MG Oral Tablet; Therapy: (Recorded:18Mar2014) to 9. Vitamin B-Complex Oral Tablet; Therapy: (Recorded:18Mar2014) to  Allergies Medication  1. Vicodin TABS  Family History Problems  1. Family history of  Death In The Family Father Died age 55-Heart failure 2. Family history of  Death In The Family Mother Died dage  74-Rare blood disorder 3. Family history of  Family Health Status Number Of Children 2 daughters  Social History Problems  1. Alcohol Use 1-2 glasses of wine a day 2. Caffeine Use 4 per day 3. Former Smoker V15.82 Smoked 1 ppd for 40 yrs and quit 1990 4. Marital History - Currently Married 5. Occupation: Retired  Event organiser, constitutional, skin, eye, otolaryngeal, hematologic/lymphatic, cardiovascular, pulmonary, endocrine, musculoskeletal, gastrointestinal, neurological and psychiatric system(s) were reviewed and pertinent findings if present are noted.    Vitals Vital Signs [Data Includes: Last 1 Day]  02Apr2014 12:14PM  Blood Pressure: 132 / 77 Temperature: 97.9 F Heart Rate: 74  Physical Exam Constitutional: Well nourished and well developed . No acute distress.  Pulmonary: No respiratory distress and normal respiratory rhythm and effort.  Cardiovascular: Heart rate and rhythm are normal . No peripheral edema.  Neuro/Psych:. Mood and affect are appropriate.    Results/Data Urine [Data Includes: Last 1 Day]   02Apr2014  COLOR YELLOW   APPEARANCE CLEAR   SPECIFIC GRAVITY 1.025   pH 6.0   GLUCOSE NEG mg/dL  BILIRUBIN NEG   KETONE NEG mg/dL  BLOOD TRACE   PROTEIN NEG mg/dL  UROBILINOGEN 0.2 mg/dL  NITRITE NEG   LEUKOCYTE ESTERASE NEG   SQUAMOUS EPITHELIAL/HPF RARE   WBC 0-2 WBC/hpf  RBC 7-10 RBC/hpf  BACTERIA RARE   CRYSTALS NONE SEEN   CASTS NONE SEEN   Other MUCUS NOTED    Procedure  Procedure: Cystoscopy   Indication: Hematuria. History  of Urothelial Carcinoma.  Informed Consent: Risks, benefits, and potential adverse events were discussed and informed consent was obtained from the patient.  Prep: The patient was prepped with betadine.  Procedure Note:  Urethral meatus:. No abnormalities.  Anterior urethra: No abnormalities.  Prostatic urethra: No abnormalities.  Bladder: Visulization was clear. The ureteral orifices were in  the normal anatomic position bilaterally and had clear efflux of urine. A systematic survey of the bladder demonstrated no bladder tumors or stones. The mucosa was smooth without abnormalities. A solitary tumor was visualized in the bladder. A papillary tumor was seen in the bladder. This tumor was located on the left side, at the base of the bladder. The patient tolerated the procedure well.  Complications: None.    Assessment Assessed  1. Benign Prostatic Hypertrophy 600.00 2. Renal Neoplasm 239.5 3. 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 UA With REFLEX  Done: 02Apr2014 11:11AM Neoplasm Of The Bladder (239.4)  2. Follow-up Schedule Surgery Office  Follow-up  Requested for: 02Apr2014 Nephrolithiasis (592.0)  3. Follow-up Month x 3 Office  Follow-up  Requested for: 02Apr2014 Renal Neoplasm (239.5)  4. KUB  Requested for: 02Apr2014 5. RENAL U/S COMPLETE  Requested for: 02Apr2014  Discussion/Summary     Renal mass-we had a long discussion about renal masses. We discussed this mass could be benign or malignant. We discussed the nature risks and benefits of surveillance, ablation, partial nephrectomy or radical nephrectomy. We will continue surveillance. We discussed looking at the literature these masses tend to grow slowly and rarely metastasized although that is a possibility. See below as the patient needs a TURBT and he also needs back surgery. Therefore we will get a renal ultrasound in 3 months to visualize this mass.  Nephrolithiasis-I discussed the 12 mm left lower pole stone. We again discussed the nature risks and benefits of surveillance versus surgical treatment.All questions answered. He studies told that he had a stone over there before and is comfortable with surveillance. Therefore we'll get a KUB in 3 months to assess any growth rate. He did have an L-spine series which showed the stone last month.  In regards to the bladder  tumor we discussed the nature risks benefits and alternatives to TURBT with postop mitomycin C. instillation. We talked about the risk of bladder perforation among others. We discussed he may need a Foley catheter or stent postop period we discussed that occasionally need a staged approach for evaluating him a stage of the tumor. All questions answered and he elects to proceed.  Plan TURBT, followup 3 months with a renal ultrasound and KUB.  Signatures Electronically signed by : Jerilee Field, M.D.; Apr 30 2012  1:23PM

## 2012-05-06 ENCOUNTER — Ambulatory Visit (HOSPITAL_BASED_OUTPATIENT_CLINIC_OR_DEPARTMENT_OTHER): Payer: Medicare Other | Admitting: Anesthesiology

## 2012-05-06 ENCOUNTER — Encounter (HOSPITAL_BASED_OUTPATIENT_CLINIC_OR_DEPARTMENT_OTHER): Payer: Self-pay | Admitting: *Deleted

## 2012-05-06 ENCOUNTER — Ambulatory Visit (HOSPITAL_BASED_OUTPATIENT_CLINIC_OR_DEPARTMENT_OTHER)
Admission: RE | Admit: 2012-05-06 | Discharge: 2012-05-06 | Disposition: A | Discharge status: Home / Self Care | Payer: Medicare Other | Source: Ambulatory Visit | Attending: Urology | Admitting: Urology

## 2012-05-06 ENCOUNTER — Encounter (HOSPITAL_BASED_OUTPATIENT_CLINIC_OR_DEPARTMENT_OTHER): Payer: Self-pay | Admitting: Anesthesiology

## 2012-05-06 ENCOUNTER — Other Ambulatory Visit: Payer: Self-pay | Admitting: Urology

## 2012-05-06 ENCOUNTER — Encounter (HOSPITAL_BASED_OUTPATIENT_CLINIC_OR_DEPARTMENT_OTHER)
Admission: RE | Disposition: A | Discharge status: Home / Self Care | Payer: Self-pay | Source: Ambulatory Visit | Attending: Urology

## 2012-05-06 DIAGNOSIS — K219 Gastro-esophageal reflux disease without esophagitis: Secondary | ICD-10-CM | POA: Insufficient documentation

## 2012-05-06 DIAGNOSIS — N400 Enlarged prostate without lower urinary tract symptoms: Secondary | ICD-10-CM | POA: Insufficient documentation

## 2012-05-06 DIAGNOSIS — R310 Gross hematuria: Secondary | ICD-10-CM | POA: Insufficient documentation

## 2012-05-06 DIAGNOSIS — R972 Elevated prostate specific antigen [PSA]: Secondary | ICD-10-CM | POA: Insufficient documentation

## 2012-05-06 DIAGNOSIS — N289 Disorder of kidney and ureter, unspecified: Secondary | ICD-10-CM | POA: Insufficient documentation

## 2012-05-06 DIAGNOSIS — N402 Nodular prostate without lower urinary tract symptoms: Secondary | ICD-10-CM | POA: Insufficient documentation

## 2012-05-06 DIAGNOSIS — C679 Malignant neoplasm of bladder, unspecified: Secondary | ICD-10-CM | POA: Insufficient documentation

## 2012-05-06 HISTORY — DX: Calculus of kidney: N20.0

## 2012-05-06 HISTORY — DX: Dorsalgia, unspecified: M54.9

## 2012-05-06 HISTORY — DX: Personal history of other diseases of the digestive system: Z87.19

## 2012-05-06 HISTORY — PX: TRANSURETHRAL RESECTION OF BLADDER TUMOR: SHX2575

## 2012-05-06 HISTORY — DX: Other chronic pain: G89.29

## 2012-05-06 HISTORY — DX: Personal history of other diseases of the nervous system and sense organs: Z86.69

## 2012-05-06 HISTORY — DX: Spinal stenosis, lumbar region without neurogenic claudication: M48.061

## 2012-05-06 SURGERY — TURBT (TRANSURETHRAL RESECTION OF BLADDER TUMOR)
Anesthesia: General | Site: Bladder | Wound class: Clean Contaminated

## 2012-05-06 MED ORDER — MITOMYCIN CHEMO FOR BLADDER INSTILLATION 40 MG
40.0000 mg | Freq: Once | INTRAVENOUS | Status: AC
  Administered 2012-05-06: 40 mg via INTRAVESICAL
  Filled 2012-05-06: qty 40

## 2012-05-06 MED ORDER — HYDROMORPHONE HCL PF 1 MG/ML IJ SOLN
0.2500 mg | INTRAMUSCULAR | Status: DC | PRN
  Filled 2012-05-06: qty 1

## 2012-05-06 MED ORDER — PROMETHAZINE HCL 25 MG/ML IJ SOLN
6.2500 mg | INTRAMUSCULAR | Status: DC | PRN
  Filled 2012-05-06: qty 1

## 2012-05-06 MED ORDER — CEFAZOLIN SODIUM 1-5 GM-% IV SOLN
1.0000 g | INTRAVENOUS | Status: DC
  Filled 2012-05-06: qty 50

## 2012-05-06 MED ORDER — LIDOCAINE HCL (CARDIAC) 20 MG/ML IV SOLN
INTRAVENOUS | Status: DC | PRN
  Administered 2012-05-06: 75 mg via INTRAVENOUS

## 2012-05-06 MED ORDER — KETOROLAC TROMETHAMINE 30 MG/ML IJ SOLN
INTRAMUSCULAR | Status: DC | PRN
  Administered 2012-05-06: 30 mg via INTRAVENOUS

## 2012-05-06 MED ORDER — MITOMYCIN CHEMO FOR BLADDER INSTILLATION 40 MG: 40.0000 mg | Freq: Once | INTRAVENOUS | Status: DC

## 2012-05-06 MED ORDER — FENTANYL CITRATE 0.05 MG/ML IJ SOLN
INTRAMUSCULAR | Status: DC | PRN
  Administered 2012-05-06 (×4): 25 ug via INTRAVENOUS

## 2012-05-06 MED ORDER — TRAMADOL HCL 50 MG PO TABS: 50.0000 mg | ORAL_TABLET | Freq: Four times a day (QID) | ORAL | Status: DC | PRN

## 2012-05-06 MED ORDER — MEPERIDINE HCL 25 MG/ML IJ SOLN
6.2500 mg | INTRAMUSCULAR | Status: DC | PRN
  Filled 2012-05-06: qty 1

## 2012-05-06 MED ORDER — ACETAMINOPHEN 10 MG/ML IV SOLN
1000.0000 mg | Freq: Once | INTRAVENOUS | Status: DC | PRN
  Filled 2012-05-06: qty 100

## 2012-05-06 MED ORDER — ONDANSETRON HCL 4 MG/2ML IJ SOLN
INTRAMUSCULAR | Status: DC | PRN
  Administered 2012-05-06: 4 mg via INTRAVENOUS

## 2012-05-06 MED ORDER — CIPROFLOXACIN HCL 500 MG PO TABS: 250.0000 mg | ORAL_TABLET | Freq: Two times a day (BID) | ORAL | Status: DC

## 2012-05-06 MED ORDER — LACTATED RINGERS IV SOLN
INTRAVENOUS | Status: DC | PRN
  Administered 2012-05-06 (×2): via INTRAVENOUS

## 2012-05-06 MED ORDER — PROPOFOL 10 MG/ML IV BOLUS
INTRAVENOUS | Status: DC | PRN
  Administered 2012-05-06: 180 mg via INTRAVENOUS
  Administered 2012-05-06: 50 mg via INTRAVENOUS
  Administered 2012-05-06: 20 mg via INTRAVENOUS

## 2012-05-06 MED ORDER — CEFAZOLIN SODIUM-DEXTROSE 2-3 GM-% IV SOLR
2.0000 g | INTRAVENOUS | Status: AC
  Administered 2012-05-06: 2 g via INTRAVENOUS
  Filled 2012-05-06: qty 50

## 2012-05-06 MED ORDER — DEXAMETHASONE SODIUM PHOSPHATE 4 MG/ML IJ SOLN
INTRAMUSCULAR | Status: DC | PRN
  Administered 2012-05-06: 10 mg via INTRAVENOUS

## 2012-05-06 MED ORDER — LACTATED RINGERS IV SOLN
INTRAVENOUS | Status: DC
  Administered 2012-05-06: 13:00:00 via INTRAVENOUS
  Filled 2012-05-06: qty 1000

## 2012-05-06 MED ORDER — SODIUM CHLORIDE 0.9 % IR SOLN
Status: DC | PRN
  Administered 2012-05-06: 6000 mL via INTRAVESICAL

## 2012-05-06 SURGICAL SUPPLY — 27 items
ADAPTER CATH URET PLST 4-6FR (CATHETERS) IMPLANT
ADPR CATH URET STRL DISP 4-6FR (CATHETERS)
BAG DRAIN URO-CYSTO SKYTR STRL (DRAIN) ×3 IMPLANT
BAG DRN ANRFLXCHMBR STRAP LEK (BAG)
BAG DRN UROCATH (DRAIN) ×2
BAG URINE DRAINAGE (UROLOGICAL SUPPLIES) ×2 IMPLANT
BAG URINE LEG 19OZ MD ST LTX (BAG) IMPLANT
CANISTER SUCT LVC 12 LTR MEDI- (MISCELLANEOUS) ×2 IMPLANT
CATH FOLEY 2WAY SLVR  5CC 16FR (CATHETERS) ×1
CATH FOLEY 2WAY SLVR  5CC 20FR (CATHETERS)
CATH FOLEY 2WAY SLVR  5CC 22FR (CATHETERS)
CATH FOLEY 2WAY SLVR 5CC 16FR (CATHETERS) ×1 IMPLANT
CATH FOLEY 2WAY SLVR 5CC 20FR (CATHETERS) IMPLANT
CATH FOLEY 2WAY SLVR 5CC 22FR (CATHETERS) IMPLANT
CATH INTERMIT  6FR 70CM (CATHETERS) IMPLANT
CATH URET 5FR 28IN CONE TIP (BALLOONS)
CATH URET 5FR 28IN OPEN ENDED (CATHETERS) IMPLANT
CATH URET 5FR 70CM CONE TIP (BALLOONS) IMPLANT
CLOTH BEACON ORANGE TIMEOUT ST (SAFETY) ×3 IMPLANT
DRAPE CAMERA CLOSED 9X96 (DRAPES) ×3 IMPLANT
ELECT LOOP MED HF 24F 12D CBL (CLIP) ×2 IMPLANT
GLOVE BIO SURGEON STRL SZ7.5 (GLOVE) ×3 IMPLANT
GLOVE SKINSENSE NS SZ7.0 (GLOVE) ×2
GLOVE SKINSENSE STRL SZ7.0 (GLOVE) ×2 IMPLANT
GOWN PREVENTION PLUS LG XLONG (DISPOSABLE) ×3 IMPLANT
GOWN STRL REIN XL XLG (GOWN DISPOSABLE) ×3 IMPLANT
PACK CYSTOSCOPY (CUSTOM PROCEDURE TRAY) ×3 IMPLANT

## 2012-05-06 NOTE — Transfer of Care (Signed)
Immediate Anesthesia Transfer of Care Note  Patient: Timothy Osborn  Procedure(s) Performed: Procedure(s) (LRB): TRANSURETHRAL RESECTION OF BLADDER TUMOR (TURBT) WITH INSTILATION OF MYTOMIACIN C (N/A)  Patient Location: PACU  Anesthesia Type: General  Level of Consciousness: awake, sedated, patient cooperative and responds to stimulation  Airway & Oxygen Therapy: Patient Spontanous Breathing and Patient connected to face mask oxygen  Post-op Assessment: Report given to PACU RN, Post -op Vital signs reviewed and stable and Patient moving all extremities  Post vital signs: Reviewed and stable  Complications: No apparent anesthesia complications

## 2012-05-06 NOTE — Anesthesia Preprocedure Evaluation (Addendum)
Anesthesia Evaluation  Patient identified by MRN, date of birth, ID band Patient awake    Reviewed: Allergy & Precautions, H&P , NPO status , Patient's Chart, lab work & pertinent test results  Airway Mallampati: I TM Distance: >3 FB Neck ROM: Full    Dental  (+) Teeth Intact, Dental Advisory Given and Missing   Pulmonary former smoker,  breath sounds clear to auscultation        Cardiovascular hypertension, Pt. on medications Rhythm:Regular Rate:Normal     Neuro/Psych PSYCHIATRIC DISORDERS Anxiety Depression negative neurological ROS     GI/Hepatic negative GI ROS, Neg liver ROS, hiatal hernia, GERD-  Medicated and Controlled,  Endo/Other  negative endocrine ROS  Renal/GU Nephrolithiasis      Musculoskeletal negative musculoskeletal ROS (+)   Abdominal   Peds  Hematology negative hematology ROS (+)   Anesthesia Other Findings   Reproductive/Obstetrics                           Anesthesia Physical  Anesthesia Plan  ASA: II  Anesthesia Plan: General   Post-op Pain Management:    Induction: Intravenous  Airway Management Planned: LMA  Additional Equipment:   Intra-op Plan:   Post-operative Plan: Extubation in OR  Informed Consent: I have reviewed the patients History and Physical, chart, labs and discussed the procedure including the risks, benefits and alternatives for the proposed anesthesia with the patient or authorized representative who has indicated his/her understanding and acceptance.   Dental advisory given  Plan Discussed with: CRNA, Anesthesiologist and Surgeon  Anesthesia Plan Comments:        Anesthesia Quick Evaluation

## 2012-05-06 NOTE — Interval H&P Note (Signed)
History and Physical Interval Note:  05/06/2012 2:04 PM  Timothy Osborn  has presented today for surgery, with the diagnosis of BLADDER MASS  The various methods of treatment have been discussed with the patient and family. After consideration of risks, benefits and other options for treatment, the patient has consented to  Procedure(s): TRANSURETHRAL RESECTION OF BLADDER TUMOR (TURBT) WITH INSTILATION OF MYTOMIACIN C (N/A) POSSIBLE STENT (N/A) as a surgical intervention .  The patient's history has been reviewed, patient examined, no change in status, stable for surgery.  I have reviewed the patient's chart and labs.  We discussed possible restaging in future, need for foley, need for ureteral stent.  We discussed side effects of the proposed treatment, the likelihood of the patient achieving the goals of the procedure, and any potential problems that might occur during the procedure or recuperation. Questions were answered to the patient's and wife's satisfaction. He elects to proceed.   Antony Haste

## 2012-05-06 NOTE — Anesthesia Postprocedure Evaluation (Signed)
  Anesthesia Post-op Note  Patient: Timothy Osborn  Procedure(s) Performed: Procedure(s) (LRB): TRANSURETHRAL RESECTION OF BLADDER TUMOR (TURBT) WITH INSTILATION OF MYTOMIACIN C (N/A)  Patient Location: PACU  Anesthesia Type: General  Level of Consciousness: awake and alert   Airway and Oxygen Therapy: Patient Spontanous Breathing  Post-op Pain: mild  Post-op Assessment: Post-op Vital signs reviewed, Patient's Cardiovascular Status Stable, Respiratory Function Stable, Patent Airway and No signs of Nausea or vomiting  Last Vitals:  Filed Vitals:   05/06/12 1312  BP: 140/85  Pulse: 88  Temp: 36.2 C  Resp: 20    Post-op Vital Signs: stable   Complications: No apparent anesthesia complications

## 2012-05-06 NOTE — Op Note (Signed)
Preoperative diagnosis: Gross hematuria, bladder tumor Postoperative diagnosis: Gross hematuria, bladder tumor  Procedure: Exam under anesthesia TURBT small Installation of mitomycin C. chemotherapy bladder irrigation and packing  Surgeon: Tasmine Hipwell  Type of anesthesia: Gen.  Findings: Exam under anesthesia - penis without lesion, testicles descended without mass. Digital rectal exam revealed a benign prostate without hard area or nodule.  Cystoscopy - the urethra was normal, prostatic urethra without hypertrophy and normal. The trigone and ureteral orifice these were normal. The bladder contained a single papillary tumor superior to the left ureteral orifice that was on a narrow, shallow base. There were no other lesions. There were no foreign bodies or stones.  Description of procedure: After consent was obtained patient brought to the operating room. After adequate anesthesia he was placed in lithotomy position. An exam under anesthesia was performed. A timeout was performed to confirm the patient and procedure. He was prepped and draped in the usual fashion. A cystoscope was passed per urethra and the bladder examined with a 12 and 70 lens. The resectoscope sheath was then placed with the visual obturator and then the resectoscope handle. Using the loop and bipolar the tumor was resected down to the base and then with 1 shallow cut the base was removed completely. The area was fulgurated. The left ureteral orifice was inferior to the fulgurated area and not involved. There was excellent clear eflux of urine. Hemostasis was excellent.  The scope was removed and a 16 [place] Foley was placed draining clear urine. The patient was awakened and taken to the recovery room in stable condition.  Specimens: Bladder tumor  Complications: None  Blood loss: Minimal  Drains: 16 [place] Foley

## 2012-05-06 NOTE — Anesthesia Procedure Notes (Addendum)
Procedure Name: LMA Insertion Date/Time: 05/06/2012 2:25 PM Performed by: Jessica Priest Pre-anesthesia Checklist: Patient identified, Emergency Drugs available, Suction available and Patient being monitored Patient Re-evaluated:Patient Re-evaluated prior to inductionOxygen Delivery Method: Circle System Utilized Preoxygenation: Pre-oxygenation with 100% oxygen Intubation Type: IV induction Ventilation: Mask ventilation without difficulty LMA: LMA with gastric port inserted LMA Size: 5.0 Number of attempts: 1 Placement Confirmation: positive ETCO2 Tube secured with: Tape Dental Injury: Teeth and Oropharynx as per pre-operative assessment    Procedure Name: LMA Insertion Date/Time: 05/06/2012 2:26 PM Performed by: Jessica Priest Pre-anesthesia Checklist: Patient identified, Emergency Drugs available, Suction available and Patient being monitored Patient Re-evaluated:Patient Re-evaluated prior to inductionOxygen Delivery Method: Circle System Utilized Preoxygenation: Pre-oxygenation with 100% oxygen Intubation Type: IV induction Ventilation: Mask ventilation without difficulty LMA: LMA inserted LMA Size: 5.0 Number of attempts: 1 Airway Equipment and Method: bite block Placement Confirmation: positive ETCO2 Tube secured with: Tape Dental Injury: Teeth and Oropharynx as per pre-operative assessment

## 2012-05-07 ENCOUNTER — Encounter (HOSPITAL_BASED_OUTPATIENT_CLINIC_OR_DEPARTMENT_OTHER): Payer: Self-pay | Admitting: Urology

## 2012-05-07 NOTE — Addendum Note (Signed)
Addendum created 05/07/12 1312 by Fran Lowes, CRNA   Modules edited: Charges VN

## 2012-05-15 ENCOUNTER — Encounter (HOSPITAL_COMMUNITY): Payer: Self-pay | Admitting: Pharmacy Technician

## 2012-05-21 ENCOUNTER — Encounter (HOSPITAL_COMMUNITY)
Admission: RE | Admit: 2012-05-21 | Discharge: 2012-05-21 | Disposition: A | Discharge status: Home / Self Care | Payer: Medicare Other | Source: Ambulatory Visit | Attending: Neurological Surgery | Admitting: Neurological Surgery

## 2012-05-21 ENCOUNTER — Encounter (HOSPITAL_COMMUNITY): Payer: Self-pay

## 2012-05-21 LAB — CBC WITH DIFFERENTIAL/PLATELET
Eosinophils Relative: 3 % (ref 0–5)
HCT: 43.7 % (ref 39.0–52.0)
Hemoglobin: 15.2 g/dL (ref 13.0–17.0)
Lymphocytes Relative: 34 % (ref 12–46)
Lymphs Abs: 2.1 10*3/uL (ref 0.7–4.0)
MCV: 89.7 fL (ref 78.0–100.0)
Monocytes Absolute: 0.6 10*3/uL (ref 0.1–1.0)
Monocytes Relative: 10 % (ref 3–12)
Platelets: 267 10*3/uL (ref 150–400)
RBC: 4.87 MIL/uL (ref 4.22–5.81)
WBC: 6 10*3/uL (ref 4.0–10.5)

## 2012-05-21 LAB — BASIC METABOLIC PANEL
CO2: 27 mEq/L (ref 19–32)
Chloride: 105 mEq/L (ref 96–112)
Potassium: 4.3 mEq/L (ref 3.5–5.1)
Sodium: 141 mEq/L (ref 135–145)

## 2012-05-21 LAB — PROTIME-INR: Prothrombin Time: 13.3 seconds (ref 11.6–15.2)

## 2012-05-21 LAB — TYPE AND SCREEN

## 2012-05-21 NOTE — Pre-Procedure Instructions (Signed)
ZYKEE AVAKIAN  05/21/2012   Your procedure is scheduled on:  Wednesday, April 30  Report to [facility] Stay [facility] at 6:30AM.  Call this number if you have problems the morning of surgery: [phone]   Remember:   Do not eat food or drink liquids after midnight.   Take these medicines the morning of surgery with A SIP OF WATER: prilosec, paxil   Do not wear jewelry, make-up or nail polish.  Do not wear lotions, powders, or perfumes. You may wear deodorant.  Do not shave 48 hours prior to surgery. Men may shave face and neck.  Do not bring valuables to the hospital.  Contacts, dentures or bridgework may not be worn into surgery.  Leave suitcase in the car. After surgery it may be brought to your room.  For patients admitted to the hospital, checkout time is 11:00 AM the day of  discharge.   Patients discharged the day of surgery will not be allowed to drive  home.  Name and phone number of your driver:   Special Instructions: Shower using CHG 2 nights before surgery and the night before surgery.  If you shower the day of surgery use CHG.  Use special wash - you have one bottle of CHG for all showers.  You should use approximately 1/3 of the bottle for each shower.   Please read over the following fact sheets that you were given: Pain Booklet, Coughing and Deep Breathing, Blood Transfusion Information, MRSA Information and Surgical Site Infection Prevention

## 2012-05-22 NOTE — Progress Notes (Signed)
Anesthesia chart review: Patient is an 79 year old male scheduled for L3-4, L4-5 maximum access surgery, PLIF by Dr. Yetta Barre on 05/28/2012. History includes former smoker, GERD, hiatal hernia, OCD, depression, anxiety, hypercholesterolemia, hematuria with bladder mass s/p cystoscopy with installation of mitomycin C. Chemotherapy bladder irrigation and packing 05/06/12.  He is s/p left TKA '12, L3-4, L4-5 lumbar laminectomy '13. PCP is Dr. Nehemiah Settle.  EKG on 05/21/12 showed NSR, LAD. According to my notes from 2012, he had an echocardiogram in 2005 showing normal LV systolic function with EF 65%, trivial MR/TR, and LA at upper limits of normal.   Preoperative CXR and labs noted.  He has tolerated multiple surgical procedures over the last 18 months.  If no acute change, then would anticipate that he could proceed as planned.  He will be evaluated by his assigned anesthesiologist on the day of surgery.  [facility]/Anesthesiology Phone [phone] 05/22/2012 3:06 PM

## 2012-05-27 MED ORDER — CEFAZOLIN SODIUM-DEXTROSE 2-3 GM-% IV SOLR
2.0000 g | INTRAVENOUS | Status: AC
  Administered 2012-05-28: 2 g via INTRAVENOUS
  Filled 2012-05-27: qty 50

## 2012-05-27 MED ORDER — DEXAMETHASONE SODIUM PHOSPHATE 10 MG/ML IJ SOLN
10.0000 mg | INTRAMUSCULAR | Status: AC
  Administered 2012-05-28: 10 mg via INTRAVENOUS
  Filled 2012-05-27: qty 1

## 2012-05-28 ENCOUNTER — Encounter (HOSPITAL_COMMUNITY): Payer: Self-pay | Admitting: *Deleted

## 2012-05-28 ENCOUNTER — Inpatient Hospital Stay (HOSPITAL_COMMUNITY): Payer: Medicare Other | Admitting: Anesthesiology

## 2012-05-28 ENCOUNTER — Encounter (HOSPITAL_COMMUNITY)
Admission: RE | Disposition: A | Discharge status: Home / Self Care | Payer: Self-pay | Source: Ambulatory Visit | Attending: Neurological Surgery

## 2012-05-28 ENCOUNTER — Encounter (HOSPITAL_COMMUNITY): Payer: Self-pay | Admitting: Vascular Surgery

## 2012-05-28 ENCOUNTER — Inpatient Hospital Stay (HOSPITAL_COMMUNITY)
Admission: RE | Admit: 2012-05-28 | Discharge: 2012-05-29 | DRG: 460 | Disposition: A | Discharge status: Home / Self Care | Payer: Medicare Other | Source: Ambulatory Visit | Attending: Neurological Surgery | Admitting: Neurological Surgery

## 2012-05-28 ENCOUNTER — Inpatient Hospital Stay (HOSPITAL_COMMUNITY): Payer: Medicare Other

## 2012-05-28 DIAGNOSIS — K219 Gastro-esophageal reflux disease without esophagitis: Secondary | ICD-10-CM | POA: Diagnosis present

## 2012-05-28 DIAGNOSIS — M48061 Spinal stenosis, lumbar region without neurogenic claudication: Principal | ICD-10-CM | POA: Diagnosis present

## 2012-05-28 DIAGNOSIS — Z981 Arthrodesis status: Secondary | ICD-10-CM

## 2012-05-28 DIAGNOSIS — M7130 Other bursal cyst, unspecified site: Secondary | ICD-10-CM | POA: Diagnosis present

## 2012-05-28 DIAGNOSIS — E7800 Pure hypercholesterolemia, unspecified: Secondary | ICD-10-CM | POA: Diagnosis present

## 2012-05-28 DIAGNOSIS — M810 Age-related osteoporosis without current pathological fracture: Secondary | ICD-10-CM | POA: Diagnosis present

## 2012-05-28 SURGERY — FOR MAXIMUM ACCESS (MAS) POSTERIOR LUMBAR INTERBODY FUSION (PLIF) 2 LEVEL
Anesthesia: General | Site: Back | Wound class: Clean

## 2012-05-28 MED ORDER — PAROXETINE HCL 20 MG PO TABS
40.0000 mg | ORAL_TABLET | Freq: Every day | ORAL | Status: DC
  Administered 2012-05-29: 40 mg via ORAL
  Filled 2012-05-28: qty 2

## 2012-05-28 MED ORDER — ONDANSETRON HCL 4 MG/2ML IJ SOLN: 4.0000 mg | INTRAMUSCULAR | Status: DC | PRN

## 2012-05-28 MED ORDER — OXYCODONE HCL 5 MG PO TABS: 5.0000 mg | ORAL_TABLET | Freq: Once | ORAL | Status: DC | PRN

## 2012-05-28 MED ORDER — SODIUM CHLORIDE 0.9 % IJ SOLN: 3.0000 mL | INTRAMUSCULAR | Status: DC | PRN

## 2012-05-28 MED ORDER — MIDAZOLAM HCL 5 MG/5ML IJ SOLN
INTRAMUSCULAR | Status: DC | PRN
  Administered 2012-05-28: 2 mg via INTRAVENOUS

## 2012-05-28 MED ORDER — LIDOCAINE HCL (CARDIAC) 20 MG/ML IV SOLN
INTRAVENOUS | Status: DC | PRN
  Administered 2012-05-28: 80 mg via INTRAVENOUS

## 2012-05-28 MED ORDER — METHOCARBAMOL 500 MG PO TABS: 500.0000 mg | ORAL_TABLET | Freq: Four times a day (QID) | ORAL | Status: DC | PRN

## 2012-05-28 MED ORDER — PANTOPRAZOLE SODIUM 40 MG PO TBEC
40.0000 mg | DELAYED_RELEASE_TABLET | Freq: Every day | ORAL | Status: DC
  Administered 2012-05-28 – 2012-05-29 (×2): 40 mg via ORAL
  Filled 2012-05-28 (×2): qty 1

## 2012-05-28 MED ORDER — 0.9 % SODIUM CHLORIDE (POUR BTL) OPTIME
TOPICAL | Status: DC | PRN
  Administered 2012-05-28: 1000 mL

## 2012-05-28 MED ORDER — SUCCINYLCHOLINE CHLORIDE 20 MG/ML IJ SOLN
INTRAMUSCULAR | Status: DC | PRN
  Administered 2012-05-28: 100 mg via INTRAVENOUS

## 2012-05-28 MED ORDER — DEXAMETHASONE SODIUM PHOSPHATE 4 MG/ML IJ SOLN
4.0000 mg | Freq: Four times a day (QID) | INTRAMUSCULAR | Status: DC
  Filled 2012-05-28 (×5): qty 1

## 2012-05-28 MED ORDER — ASPIRIN EC 81 MG PO TBEC
81.0000 mg | DELAYED_RELEASE_TABLET | Freq: Every day | ORAL | Status: DC
Start: 2012-05-28 — End: 2012-05-29
  Administered 2012-05-28: 81 mg via ORAL
  Filled 2012-05-28 (×2): qty 1

## 2012-05-28 MED ORDER — HYDROMORPHONE HCL PF 1 MG/ML IJ SOLN: 0.2500 mg | INTRAMUSCULAR | Status: DC | PRN

## 2012-05-28 MED ORDER — MENTHOL 3 MG MT LOZG: 1.0000 | LOZENGE | OROMUCOSAL | Status: DC | PRN

## 2012-05-28 MED ORDER — PHENYLEPHRINE HCL 10 MG/ML IJ SOLN
10.0000 mg | INTRAVENOUS | Status: DC | PRN
  Administered 2012-05-28: 15 ug/min via INTRAVENOUS

## 2012-05-28 MED ORDER — SODIUM CHLORIDE 0.9 % IV SOLN: 250.0000 mL | INTRAVENOUS | Status: DC

## 2012-05-28 MED ORDER — CELECOXIB 200 MG PO CAPS
200.0000 mg | ORAL_CAPSULE | Freq: Two times a day (BID) | ORAL | Status: DC
  Administered 2012-05-28 – 2012-05-29 (×3): 200 mg via ORAL
  Filled 2012-05-28 (×4): qty 1

## 2012-05-28 MED ORDER — SODIUM CHLORIDE 0.9 % IV SOLN
INTRAVENOUS | Status: AC
  Filled 2012-05-28: qty 500

## 2012-05-28 MED ORDER — THROMBIN 5000 UNITS EX SOLR
OROMUCOSAL | Status: DC | PRN
  Administered 2012-05-28: 11:00:00 via TOPICAL

## 2012-05-28 MED ORDER — BACITRACIN 50000 UNITS IM SOLR
INTRAMUSCULAR | Status: AC
  Filled 2012-05-28: qty 1

## 2012-05-28 MED ORDER — ACETAMINOPHEN 10 MG/ML IV SOLN
1000.0000 mg | Freq: Four times a day (QID) | INTRAVENOUS | Status: DC
Start: 2012-05-28 — End: 2012-05-29
  Administered 2012-05-28 – 2012-05-29 (×3): 1000 mg via INTRAVENOUS
  Filled 2012-05-28 (×4): qty 100

## 2012-05-28 MED ORDER — POTASSIUM CHLORIDE IN NACL 20-0.9 MEQ/L-% IV SOLN
INTRAVENOUS | Status: DC
  Administered 2012-05-28: 16:00:00 via INTRAVENOUS
  Filled 2012-05-28 (×4): qty 1000

## 2012-05-28 MED ORDER — ONDANSETRON HCL 4 MG/2ML IJ SOLN: 4.0000 mg | Freq: Four times a day (QID) | INTRAMUSCULAR | Status: DC | PRN

## 2012-05-28 MED ORDER — FENTANYL CITRATE 0.05 MG/ML IJ SOLN
INTRAMUSCULAR | Status: DC | PRN
  Administered 2012-05-28 (×6): 50 ug via INTRAVENOUS

## 2012-05-28 MED ORDER — THROMBIN 20000 UNITS EX SOLR
CUTANEOUS | Status: DC | PRN
  Administered 2012-05-28: 09:00:00 via TOPICAL

## 2012-05-28 MED ORDER — CEFAZOLIN SODIUM 1-5 GM-% IV SOLN
1.0000 g | Freq: Three times a day (TID) | INTRAVENOUS | Status: AC
  Administered 2012-05-28 (×2): 1 g via INTRAVENOUS
  Filled 2012-05-28 (×3): qty 50

## 2012-05-28 MED ORDER — LACTATED RINGERS IV SOLN
INTRAVENOUS | Status: DC | PRN
  Administered 2012-05-28 (×3): via INTRAVENOUS

## 2012-05-28 MED ORDER — SODIUM CHLORIDE 0.9 % IR SOLN
Status: DC | PRN
  Administered 2012-05-28: 09:00:00

## 2012-05-28 MED ORDER — MORPHINE SULFATE 2 MG/ML IJ SOLN: 1.0000 mg | INTRAMUSCULAR | Status: DC | PRN

## 2012-05-28 MED ORDER — PHENOL 1.4 % MT LIQD: 1.0000 | OROMUCOSAL | Status: DC | PRN

## 2012-05-28 MED ORDER — PHENYLEPHRINE HCL 10 MG/ML IJ SOLN
INTRAMUSCULAR | Status: DC | PRN
  Administered 2012-05-28: 80 ug via INTRAVENOUS
  Administered 2012-05-28 (×2): 40 ug via INTRAVENOUS

## 2012-05-28 MED ORDER — ONDANSETRON HCL 4 MG/2ML IJ SOLN
INTRAMUSCULAR | Status: DC | PRN
  Administered 2012-05-28: 4 mg via INTRAVENOUS

## 2012-05-28 MED ORDER — OXYCODONE HCL 5 MG/5ML PO SOLN
5.0000 mg | Freq: Once | ORAL | Status: DC | PRN
Start: 2012-05-28 — End: 2012-05-28

## 2012-05-28 MED ORDER — ALENDRONATE SODIUM 70 MG PO TABS: 70.0000 mg | ORAL_TABLET | ORAL | Status: DC

## 2012-05-28 MED ORDER — OXYCODONE HCL 5 MG PO TABS: 10.0000 mg | ORAL_TABLET | Freq: Four times a day (QID) | ORAL | Status: DC | PRN

## 2012-05-28 MED ORDER — DEXAMETHASONE 4 MG PO TABS
4.0000 mg | ORAL_TABLET | Freq: Four times a day (QID) | ORAL | Status: DC
  Administered 2012-05-28 – 2012-05-29 (×5): 4 mg via ORAL
  Filled 2012-05-28 (×8): qty 1

## 2012-05-28 MED ORDER — ACETAMINOPHEN 325 MG PO TABS: 650.0000 mg | ORAL_TABLET | ORAL | Status: DC | PRN

## 2012-05-28 MED ORDER — PROPOFOL 10 MG/ML IV BOLUS
INTRAVENOUS | Status: DC | PRN
  Administered 2012-05-28: 150 mg via INTRAVENOUS

## 2012-05-28 MED ORDER — ACETAMINOPHEN 650 MG RE SUPP: 650.0000 mg | RECTAL | Status: DC | PRN

## 2012-05-28 MED ORDER — METHOCARBAMOL 100 MG/ML IJ SOLN
500.0000 mg | Freq: Four times a day (QID) | INTRAVENOUS | Status: DC | PRN
  Filled 2012-05-28: qty 5

## 2012-05-28 MED ORDER — SODIUM CHLORIDE 0.9 % IJ SOLN: 3.0000 mL | Freq: Two times a day (BID) | INTRAMUSCULAR | Status: DC

## 2012-05-28 MED ORDER — BUPIVACAINE HCL (PF) 0.25 % IJ SOLN
INTRAMUSCULAR | Status: DC | PRN
  Administered 2012-05-28: 6 mL

## 2012-05-28 SURGICAL SUPPLY — 66 items
APL SKNCLS STERI-STRIP NONHPOA (GAUZE/BANDAGES/DRESSINGS) ×1
BAG DECANTER FOR FLEXI CONT (MISCELLANEOUS) ×2 IMPLANT
BENZOIN TINCTURE PRP APPL 2/3 (GAUZE/BANDAGES/DRESSINGS) ×2 IMPLANT
BLADE SURG ROTATE 9660 (MISCELLANEOUS) ×1 IMPLANT
BONE MATRIX OSTEOCEL PRO MED (Bone Implant) ×1 IMPLANT
BUR MATCHSTICK NEURO 3.0 LAGG (BURR) ×2 IMPLANT
CAGE SPINAL COROENT MP 11X28X9 (Cage) IMPLANT
CANISTER SUCTION 2500CC (MISCELLANEOUS) ×2 IMPLANT
CLIP NEUROVISION LG (CLIP) ×1 IMPLANT
CLOTH BEACON ORANGE TIMEOUT ST (SAFETY) ×2 IMPLANT
CONT SPEC 4OZ CLIKSEAL STRL BL (MISCELLANEOUS) ×4 IMPLANT
CORDS BIPOLAR (ELECTRODE) ×1 IMPLANT
COROENT MP 11X28X9MM (Cage) ×8 IMPLANT
COVER BACK TABLE 24X17X13 BIG (DRAPES) IMPLANT
COVER TABLE BACK 60X90 (DRAPES) ×2 IMPLANT
DRAPE C-ARM 42X72 X-RAY (DRAPES) ×2 IMPLANT
DRAPE C-ARMOR (DRAPES) ×2 IMPLANT
DRAPE LAPAROTOMY 100X72X124 (DRAPES) ×2 IMPLANT
DRAPE POUCH INSTRU U-SHP 10X18 (DRAPES) ×2 IMPLANT
DRAPE SURG 17X23 STRL (DRAPES) ×2 IMPLANT
DRESSING TELFA 8X3 (GAUZE/BANDAGES/DRESSINGS) ×2 IMPLANT
DRSG OPSITE 4X5.5 SM (GAUZE/BANDAGES/DRESSINGS) ×3 IMPLANT
DURAPREP 26ML APPLICATOR (WOUND CARE) ×2 IMPLANT
ELECT REM PT RETURN 9FT ADLT (ELECTROSURGICAL) ×2
ELECTRODE REM PT RTRN 9FT ADLT (ELECTROSURGICAL) ×1 IMPLANT
EVACUATOR 1/8 PVC DRAIN (DRAIN) ×2 IMPLANT
GAUZE SPONGE 4X4 16PLY XRAY LF (GAUZE/BANDAGES/DRESSINGS) IMPLANT
GLOVE BIOGEL M 8.0 STRL (GLOVE) ×4 IMPLANT
GLOVE ECLIPSE 6.5 STRL STRAW (GLOVE) ×1 IMPLANT
GLOVE ECLIPSE 7.5 STRL STRAW (GLOVE) ×2 IMPLANT
GLOVE INDICATOR 8.0 STRL GRN (GLOVE) ×2 IMPLANT
GOWN BRE IMP SLV AUR LG STRL (GOWN DISPOSABLE) IMPLANT
GOWN BRE IMP SLV AUR XL STRL (GOWN DISPOSABLE) ×4 IMPLANT
GOWN STRL REIN 2XL LVL4 (GOWN DISPOSABLE) ×1 IMPLANT
HEMOSTAT POWDER KIT SURGIFOAM (HEMOSTASIS) ×1 IMPLANT
KIT BASIN OR (CUSTOM PROCEDURE TRAY) ×2 IMPLANT
KIT NDL NVM5 EMG ELECT (KITS) IMPLANT
KIT NEEDLE NVM5 EMG ELECT (KITS) ×1 IMPLANT
KIT NEEDLE NVM5 EMG ELECTRODE (KITS) ×1
KIT ROOM TURNOVER OR (KITS) ×2 IMPLANT
MILL MEDIUM DISP (BLADE) ×1 IMPLANT
NDL HYPO 25X1 1.5 SAFETY (NEEDLE) ×1 IMPLANT
NEEDLE HYPO 25X1 1.5 SAFETY (NEEDLE) ×2 IMPLANT
NS IRRIG 1000ML POUR BTL (IV SOLUTION) ×2 IMPLANT
PACK LAMINECTOMY NEURO (CUSTOM PROCEDURE TRAY) ×2 IMPLANT
PAD ARMBOARD 7.5X6 YLW CONV (MISCELLANEOUS) ×6 IMPLANT
ROD 70MM (Rod) ×4 IMPLANT
ROD SPNL 70XPREBNT NS MAS (Rod) IMPLANT
SCREW LOCK (Screw) ×12 IMPLANT
SCREW LOCK FXNS SPNE MAS PL (Screw) IMPLANT
SCREW PLIF MAS 5.0X35 (Screw) ×2 IMPLANT
SCREW SHANK 5.0X30MM (Screw) ×3 IMPLANT
SCREW SHANK 5.5X30MM (Screw) ×1 IMPLANT
SCREW TULIP 5.5 (Screw) ×4 IMPLANT
SPONGE LAP 4X18 X RAY DECT (DISPOSABLE) IMPLANT
SPONGE SURGIFOAM ABS GEL 100 (HEMOSTASIS) ×2 IMPLANT
STRIP CLOSURE SKIN 1/2X4 (GAUZE/BANDAGES/DRESSINGS) ×4 IMPLANT
SUT VIC AB 0 CT1 18XCR BRD8 (SUTURE) ×1 IMPLANT
SUT VIC AB 0 CT1 8-18 (SUTURE) ×4
SUT VIC AB 2-0 CP2 18 (SUTURE) ×3 IMPLANT
SUT VIC AB 3-0 SH 8-18 (SUTURE) ×4 IMPLANT
SYR 20ML ECCENTRIC (SYRINGE) ×2 IMPLANT
TOWEL OR 17X24 6PK STRL BLUE (TOWEL DISPOSABLE) ×2 IMPLANT
TOWEL OR 17X26 10 PK STRL BLUE (TOWEL DISPOSABLE) ×2 IMPLANT
TRAY FOLEY CATH 16FRSI W/METER (SET/KITS/TRAYS/PACK) ×1 IMPLANT
WATER STERILE IRR 1000ML POUR (IV SOLUTION) ×2 IMPLANT

## 2012-05-28 NOTE — H&P (Signed)
Subjective: Patient is a 79 y.o. male admitted for PLIF L3-4 L4-5 for recurrent stenosis with bilateral synovial cysts. Onset of symptoms was a few months ago, gradually worsening since that time.  He is status post L3-4 L4-5 laminectomy for stenosis. The pain is rated moderate, and is located at the across the lower back and radiates to legs. The pain is described as aching and occurs intermittently. The symptoms have been progressive. Symptoms are exacerbated by exercise. MRI or CT showed recurrent stenosis with synovial cyst at L3-4 L4-5.   Past Medical History  Diagnosis Date  . GERD (gastroesophageal reflux disease)   . Anxiety   . Hypercholesterolemia   . Depression     when he retired.....  Marland Kitchen Arthritis   . Osteoporosis     /OSTEOPENIA  . OCD (obsessive compulsive disorder)     "MILD CASE"  . Bladder mass   . Spinal stenosis of lumbar region   . Chronic back pain   . H/O hiatal hernia   . History of retinal detachment PARTIAL DETACH-- NO SURGICAL INTERVENTION--  RESOLVED    RIGHT EYE 2008/   LEFT 2012  . Renal calculus, left     Past Surgical History  Procedure Laterality Date  . Tonsillectomy    . Total knee arthroplasty  12/18/2010    Procedure: TOTAL KNEE ARTHROPLASTY;  Surgeon: Nilda Simmer, MD;  Location: [facility] OR;  Service: Orthopedics;  Laterality: Left;  ARTHROPLASTY KNEE TOTAL LEFT SIDE  . Colonoscopy    . Lumbar laminectomy/decompression microdiscectomy  08/29/2011    Procedure: LUMBAR LAMINECTOMY/DECOMPRESSION MICRODISCECTOMY 2 LEVELS;  Surgeon: Tia Alert, MD;  Location: [facility] OR[facility];  Service: Neurosurgery;  Laterality: N/A;  lumbar three-five lumbar laminectomy   . Inguinal hernia repair Left   . Transurethral resection of bladder tumor N/A 05/06/2012    Procedure: TRANSURETHRAL RESECTION OF BLADDER TUMOR (TURBT) WITH INSTILATION OF MYTOMIACIN C;  Surgeon: Antony Haste, MD;  Location: [facility];  Service: Urology;  Laterality: N/A;   . Eye surgery      Prior to Admission medications   Medication Sig Start Date End Date Taking? Authorizing Provider  alendronate (FOSAMAX) 70 MG tablet Take 70 mg by mouth every 7 (seven) days. Take with a full glass of water on an empty stomach. Take on Fridays   Yes Historical Provider, MD  aspirin EC 81 MG tablet Take 81 mg by mouth daily with supper.   Yes Historical Provider, MD  b complex vitamins tablet Take 1 tablet by mouth daily with lunch.   Yes Historical Provider, MD  Calcium Carbonate-Vitamin D (CALTRATE 600+D PO) Take 1 tablet by mouth 2 (two) times daily. At lunch and dinner   Yes Historical Provider, MD  Coenzyme Q10 (COQ10) 200 MG CAPS Take 200 mg by mouth daily with lunch.   Yes Historical Provider, MD  meloxicam (MOBIC) 15 MG tablet Take 15 mg by mouth daily.   Yes Historical Provider, MD  Multiple Vitamin (MULTIVITAMIN WITH MINERALS) TABS Take 1 tablet by mouth daily with lunch. Centrum   Yes Historical Provider, MD  omeprazole (PRILOSEC) 40 MG capsule Take 40 mg by mouth daily.    Yes Katy Apo, MD  PARoxetine (PAXIL) 40 MG tablet Take 40 mg by mouth daily.    Yes Katy Apo, MD  Polyethyl Glycol-Propyl Glycol (SYSTANE OP) Apply 1 drop to eye daily as needed (for dry eyes).    Yes Historical Provider, MD  simvastatin (ZOCOR) 20  MG tablet Take 20 mg by mouth daily with supper.    Yes Katy Apo, MD   Allergies  Allergen Reactions  . Prozac (Fluoxetine Hcl) Other (See Comments)    "Jittery"  . Vicodin (Hydrocodone-Acetaminophen) Other (See Comments)    hallucinations    History  Substance Use Topics  . Smoking status: Former Smoker -- 1.00 packs/day for 8 years    Types: Pipe    Quit date: 12/05/1986  . Smokeless tobacco: Former Neurosurgeon    Quit date: 07/12/1986  . Alcohol Use: 0.0 oz/week    1-2 Glasses of wine per week     Comment: per day    Family History  Problem Relation Age of Onset  . Heart failure Father   . Heart attack Brother       Review of Systems  Positive ROS: neg  All other systems have been reviewed and were otherwise negative with the exception of those mentioned in the HPI and as above.  Objective: Vital signs in last 24 hours: Temp:  [97 F (36.1 C)] 97 F (36.1 C) (04/30 1610) Pulse Rate:  [71] 71 (04/30 0638) Resp:  [18] 18 (04/30 0638) BP: (133)/(78) 133/78 mmHg (04/30 0638) SpO2:  [93 %] 93 % (04/30 9604)  General Appearance: Alert, cooperative, no distress, appears stated age Head: Normocephalic, without obvious abnormality, atraumatic Eyes: PERRL, conjunctiva/corneas clear, EOM's intact, fundi benign, both eyes      Ears: Normal TM's and external ear canals, both ears Throat: Lips, mucosa, and tongue normal; teeth and gums normal Neck: Supple, symmetrical, trachea midline, no adenopathy; thyroid: No enlargement/tenderness/nodules; no carotid bruit or JVD Back: Symmetric, no curvature, ROM normal, no CVA tenderness Lungs: Clear to auscultation bilaterally, respirations unlabored Heart: Regular rate and rhythm, S1 and S2 normal, no murmur, rub or gallop Abdomen: Soft, non-tender, bowel sounds active all four quadrants, no masses, no organomegaly Extremities: Extremities normal, atraumatic, no cyanosis or edema Pulses: 2+ and symmetric all extremities Skin: Skin color, texture, turgor normal, no rashes or lesions  NEUROLOGIC:   Mental status: Alert and oriented x4,  no aphasia, good attention span, fund of knowledge, and memory Motor Exam - grossly normal Sensory Exam - grossly normal Reflexes: 1+ Coordination - grossly normal Gait - grossly normal Balance - grossly normal Cranial Nerves: I: smell Not tested  II: visual acuity  OS: nl    OD: nl  II: visual fields Full to confrontation  II: pupils Equal, round, reactive to light  III,VII: ptosis None  III,IV,VI: extraocular muscles  Full ROM  V: mastication Normal  V: facial light touch sensation  Normal  V,VII: corneal reflex   Present  VII: facial muscle function - upper  Normal  VII: facial muscle function - lower Normal  VIII: hearing Not tested  IX: soft palate elevation  Normal  IX,X: gag reflex Present  XI: trapezius strength  5/5  XI: sternocleidomastoid strength 5/5  XI: neck flexion strength  5/5  XII: tongue strength  Normal    Data Review Lab Results  Component Value Date   WBC 6.0 05/21/2012   HGB 15.2 05/21/2012   HCT 43.7 05/21/2012   MCV 89.7 05/21/2012   PLT 267 05/21/2012   Lab Results  Component Value Date   NA 141 05/21/2012   K 4.3 05/21/2012   CL 105 05/21/2012   CO2 27 05/21/2012   BUN 21 05/21/2012   CREATININE 0.99 05/21/2012   GLUCOSE 87 05/21/2012   Lab Results  Component  Value Date   INR 1.02 05/21/2012    Assessment/Plan: Patient admitted for PLIF L3-4 L4-5. Patient has failed conservative therapy.  I explained the condition and procedure to the patient and answered any questions.  Patient wishes to proceed with procedure as planned. Understands risks/ benefits and typical outcomes of procedure.   Josiephine Simao S 05/28/2012 8:30 AM

## 2012-05-28 NOTE — Progress Notes (Signed)
PHARMACIST - PHYSICIAN COMMUNICATION  CONCERNING: P&T Medication Policy Regarding Oral Bisphosphonates  RECOMMENDATION: Your order for alendronate (Fosamax), has been discontinued at this time.  If the patient's post-hospital medical condition warrants safe use of this class of drugs, please resume the pre-hospital regimen upon discharge.  DESCRIPTION:  Alendronate (Fosamax), ibandronate (Boniva), and risedronate (Actonel) can cause severe esophageal erosions in patients who are unable to remain upright at least 30 minutes after taking this medication.   Since brief interruptions in therapy are thought to have minimal impact on bone mineral density, the Pharmacy & Therapeutics Committee has established that bisphosphonate orders should be routinely discontinued during hospitalization.   To override this safety policy and permit administration of Boniva, Fosamax, or Actonel in the hospital, prescribers must write "DO NOT HOLD" in the comments section when placing the order for this class of medications.  Thanks, Wendie Simmer, PharmD, BCPS Clinical Pharmacist  Pager: [phone]

## 2012-05-28 NOTE — Op Note (Signed)
05/28/2012  1:31 PM  PATIENT:  Timothy Osborn  79 y.o. male  PRE-OPERATIVE DIAGNOSIS:  Recurrent stenosis L3-4 L4-5 with synovial cyst formation and instability, back and leg pain  POST-OPERATIVE DIAGNOSIS:  Same  PROCEDURE:   1. Decompressive lumbar laminectomy L3-4 and L4-5 with removal of large bilateral synovial cysts requiring more work than would be required of the typical PLIF procedure in order to adequately decompress the neural elements.  2. Posterior lumbar interbody fusion L4-5 and L3-4 using PEEK interbody cages packed with morcellized allograft and autograft  3. Posterior fixation L3-L5 inclusive using cortical pedicle screws.    SURGEON:  Marikay Alar, MD  ASSISTANTS: Dr. Franky Macho  ANESTHESIA:  General  EBL: 300 ml  Total I/O In: 3000 [I.V.:3000] Out: 450 [Urine:150; Blood:300]  BLOOD ADMINISTERED:none  DRAINS: Hemovac   INDICATION FOR PROCEDURE: This patient underwent a decompressive laminectomy at L3-4 and L4-5 in the past year, presented with recurrent pain. MRI showed recurrent stenosis with bilateral synovial cysts. Recommended a decompression and instrument infusion after he failed medical management. Patient understood the risks, benefits, and alternatives and potential outcomes and wished to proceed.  PROCEDURE DETAILS:  The patient was brought to the operating room. After induction of generalized endotracheal anesthesia the patient was rolled into the prone position on chest rolls and all pressure points were padded. The patient's lumbar region was cleaned and then prepped with DuraPrep and draped in the usual sterile fashion. Anesthesia was injected and then a dorsal midline incision was made and carried down to the lumbosacral fascia. The fascia was opened and the paraspinous musculature was taken down in a subperiosteal fashion to expose L3-4 L4-5 bilaterally. Intraoperative fluoroscopy confirmed my level, and I started with placement of the L3 and L4  pedicle screws bilaterally. EMG monitoring was used . AP and lateral fluoroscopy as well as surface landmarks were used to identify the pedicle screw entry zones at L3 and L4 bilaterally. I drilled and then tapped in an upward and outward direction and then placed 50 by 30 mm pedicle screws into the L3 and L4 pedicles bilaterally. I then turned my attention to the decompression and the L3 spinous process was removed and complete lumbar laminectomies, hemi- facetectomies, and foraminotomies were performed at L3-4. There was no spinous process of L4 therefore I was able to remove the hemi-facets. The yellow ligament was removed to expose the underlying dura and nerve roots at L3-4, and generous foraminotomies were performed to adequately decompress the neural elements. At L4-5 had worked through dense scar tissue bilaterally to a depth the disc space. Synovial cysts were found at L3-4 bilaterally and at L4-5 on the left. These were removed. Once the decompression was complete, I turned my attention to the posterior lower lumbar interbody fusion. The epidural venous vasculature was coagulated and cut sharply at each level. Disc space was incised and the initial discectomy was performed with pituitary rongeurs. The disc space was distracted with sequential distractors to a height of 11 mm at each level. We then used a series of scrapers and shavers to prepare the endplates for fusion. The midline was prepared with Epstein curettes. Once the complete discectomy was finished, we packed an appropriate sized peek interbody cage with local autograft and morcellized allograft, gently retracted the nerve root, and tapped the cage into position at L3-4 and L4-5 bilaterally. The midline was packed with morselized autograft and allograft. We then turned our attention to the posterior fixation at L5. The pedicle screw entry  zones were identified utilizing surface landmarks and fluoroscopy. We probed each pedicle with the pedicle  probe and tapped each pedicle with the appropriate tap. We palpated with a ball probe to assure no break in the cortex. We then placed 5-0  x 35 mm pedicle screws into the pedicles bilaterally at L5.. We then placed lordotic rods into the multiaxial screw heads of the pedicle screws and locked these in position with the locking caps and anti-torque device.  We then checked our construct with AP and lateral fluoroscopy. Irrigated with copious amounts of bacitracin-containing saline solution. Placed a medium Hemovac drain through separate stab incision. Inspected the nerve roots once again to assure adequate decompression, lined to the dura with Gelfoam, and closed the muscle and the fascia with 0 Vicryl. Closed the subcutaneous tissues with 2-0 Vicryl and subcuticular tissues with 3-0 Vicryl. The skin was closed with benzoin and Steri-Strips. Dressing was then applied, the patient was awakened from general anesthesia and transported to the recovery room in stable condition. At the end of the procedure all sponge, needle and instrument counts were correct.   PLAN OF CARE: Admit to inpatient   PATIENT DISPOSITION:  PACU - hemodynamically stable.   Delay start of Pharmacological VTE agent (>24hrs) due to surgical blood loss or risk of bleeding:  yes

## 2012-05-28 NOTE — Anesthesia Postprocedure Evaluation (Signed)
Anesthesia Post Note  Patient: Timothy Osborn  Procedure(s) Performed: Procedure(s) (LRB): Lumbar three-four, lumbar four-five maximum access surgery posterior lumbar interbody fusion (N/A)  Anesthesia type: General  Patient location: PACU  Post pain: Pain level controlled and Adequate analgesia  Post assessment: Post-op Vital signs reviewed, Patient's Cardiovascular Status Stable, Respiratory Function Stable, Patent Airway and Pain level controlled  Last Vitals:  Filed Vitals:   05/28/12 1400  BP: 124/72  Pulse: 97  Temp:   Resp: 10    Post vital signs: Reviewed and stable  Level of consciousness: awake, alert  and oriented  Complications: No apparent anesthesia complications

## 2012-05-28 NOTE — Transfer of Care (Signed)
Immediate Anesthesia Transfer of Care Note  Patient: Timothy Osborn  Procedure(s) Performed: Procedure(s) with comments: Lumbar three-four, lumbar four-five maximum access surgery posterior lumbar interbody fusion (N/A) - Lumbar three-four, lumbar four-five maximum access surgery posterior lumbar interbody fusion  Patient Location: PACU  Anesthesia Type:General  Level of Consciousness: awake, alert  and oriented  Airway & Oxygen Therapy: Patient Spontanous Breathing and Patient connected to face mask oxygen  Post-op Assessment: Report given to PACU RN, Post -op Vital signs reviewed and stable and Patient moving all extremities  Post vital signs: Reviewed and stable  Complications: No apparent anesthesia complications

## 2012-05-28 NOTE — Anesthesia Preprocedure Evaluation (Signed)
Anesthesia Evaluation  Patient identified by MRN, date of birth, ID band Patient awake    Reviewed: Allergy & Precautions, H&P , NPO status , Patient's Chart, lab work & pertinent test results  Airway Mallampati: II  Neck ROM: full    Dental   Pulmonary          Cardiovascular     Neuro/Psych PSYCHIATRIC DISORDERS Anxiety Depression    GI/Hepatic hiatal hernia, GERD-  ,  Endo/Other    Renal/GU      Musculoskeletal  (+) Arthritis -,   Abdominal   Peds  Hematology   Anesthesia Other Findings   Reproductive/Obstetrics                           Anesthesia Physical Anesthesia Plan  ASA: III  Anesthesia Plan: General   Post-op Pain Management:    Induction: Intravenous  Airway Management Planned: Oral ETT  Additional Equipment:   Intra-op Plan:   Post-operative Plan: Extubation in OR  Informed Consent: I have reviewed the patients History and Physical, chart, labs and discussed the procedure including the risks, benefits and alternatives for the proposed anesthesia with the patient or authorized representative who has indicated his/her understanding and acceptance.     Plan Discussed with: CRNA, Surgeon and Anesthesiologist  Anesthesia Plan Comments:         Anesthesia Quick Evaluation

## 2012-05-28 NOTE — Preoperative (Signed)
Beta Blockers   Reason not to administer Beta Blockers:Not Applicable 

## 2012-05-28 NOTE — OR Nursing (Signed)
Neuro monitoring provided by [employer]. Needle electrodes were placed by Naomie Dean, RN and Sandy Salaam, RN, and placement approved by [facility] Rep. Chancy Milroy. Needle electrodes were removed at the end of the case.

## 2012-05-29 MED ORDER — ZOLPIDEM TARTRATE 5 MG PO TABS
5.0000 mg | ORAL_TABLET | Freq: Once | ORAL | Status: AC
  Administered 2012-05-29: 5 mg via ORAL
  Filled 2012-05-29: qty 1

## 2012-05-29 MED ORDER — METHOCARBAMOL 500 MG PO TABS: 500.0000 mg | ORAL_TABLET | Freq: Four times a day (QID) | ORAL | Status: DC | PRN

## 2012-05-29 MED ORDER — OXYCODONE HCL 10 MG PO TABS: 10.0000 mg | ORAL_TABLET | Freq: Four times a day (QID) | ORAL | Status: DC | PRN

## 2012-05-29 NOTE — Discharge Summary (Signed)
Physician Discharge Summary  Patient ID: Timothy Osborn MRN: [id number] DOB/AGE: January 19, 1929 79 y.o.  Admit date: 05/28/2012 Discharge date: 05/29/2012  Admission Diagnoses: Instability with stenosis L3-4 L4   Discharge Diagnoses: Same   Discharged Condition: good  Hospital Course: The patient was admitted on 05/28/2012 and taken to the operating room where the patient underwent PLIF L3-4 L4-5. The patient tolerated the procedure well and was taken to the recovery room and then to the floor in stable condition. The hospital course was routine. There were no complications. The wound remained clean dry and intact. Pt had appropriate back soreness. No complaints of leg pain or new N/T/W. The patient remained afebrile with stable vital signs, and tolerated a regular diet. The patient continued to increase activities, and pain was well controlled with oral pain medications.   Consults: None  Significant Diagnostic Studies:  Results for orders placed during the hospital encounter of 05/21/12  SURGICAL PCR SCREEN      Result Value Range   MRSA, PCR NEGATIVE  NEGATIVE   Staphylococcus aureus NEGATIVE  NEGATIVE  BASIC METABOLIC PANEL      Result Value Range   Sodium 141  135 - 145 mEq/L   Potassium 4.3  3.5 - 5.1 mEq/L   Chloride 105  96 - 112 mEq/L   CO2 27  19 - 32 mEq/L   Glucose, Bld 87  70 - 99 mg/dL   BUN 21  6 - 23 mg/dL   Creatinine, Ser 4.09  0.50 - 1.35 mg/dL   Calcium 9.2  8.4 - 81.1 mg/dL   GFR calc non Af Amer 75 (*) >90 mL/min   GFR calc Af Amer 87 (*) >90 mL/min  CBC WITH DIFFERENTIAL      Result Value Range   WBC 6.0  4.0 - 10.5 K/uL   RBC 4.87  4.22 - 5.81 MIL/uL   Hemoglobin 15.2  13.0 - 17.0 g/dL   HCT 91.4  78.2 - 95.6 %   MCV 89.7  78.0 - 100.0 fL   MCH 31.2  26.0 - 34.0 pg   MCHC 34.8  30.0 - 36.0 g/dL   RDW 21.3  08.6 - 57.8 %   Platelets 267  150 - 400 K/uL   Neutrophils Relative 53  43 - 77 %   Neutro Abs 3.1  1.7 - 7.7 K/uL   Lymphocytes Relative 34   12 - 46 %   Lymphs Abs 2.1  0.7 - 4.0 K/uL   Monocytes Relative 10  3 - 12 %   Monocytes Absolute 0.6  0.1 - 1.0 K/uL   Eosinophils Relative 3  0 - 5 %   Eosinophils Absolute 0.2  0.0 - 0.7 K/uL   Basophils Relative 1  0 - 1 %   Basophils Absolute 0.1  0.0 - 0.1 K/uL  PROTIME-INR      Result Value Range   Prothrombin Time 13.3  11.6 - 15.2 seconds   INR 1.02  0.00 - 1.49  TYPE AND SCREEN      Result Value Range   ABO/RH(D) O POS     Antibody Screen NEG     Sample Expiration 06/04/2012      Chest 2 View  05/21/2012  *RADIOLOGY REPORT*  Clinical Data: Preop PLIF  CHEST - 2 VIEW  Comparison: 12/12/2010  Findings: Lungs are essentially clear.  No focal consolidation.  No pleural effusion or pneumothorax.  The heart is normal in size.  Mild degenerative changes of the  visualized thoracolumbar spine.  IMPRESSION: No evidence of acute cardiopulmonary disease.   Original Report Authenticated By: Charline Bills, M.D.    Dg Lumbar Spine 2-3 Views  05/28/2012  *RADIOLOGY REPORT*  Clinical Data: L3-5 fusion.  LUMBAR SPINE - 2-3 VIEW, DG C-ARM 61-120 MIN  Comparison: Lumbar spine MRI 03/05/2012.  Findings: We are provided with two fluoroscopic intraoperative spot views of the lumbar spine.  Images demonstrate  pedicle screws, stabilization bars and interbody spacers from L3-L5.  Laminectomy defect at the fusion levels also seen. Hardware appears intact.  No fracture is identified.  IMPRESSION: L3-5 PLIF   Original Report Authenticated By: Holley Dexter, M.D.    Dg C-arm [phone] Min  05/28/2012  *RADIOLOGY REPORT*  Clinical Data: L3-5 fusion.  LUMBAR SPINE - 2-3 VIEW, DG C-ARM 61-120 MIN  Comparison: Lumbar spine MRI 03/05/2012.  Findings: We are provided with two fluoroscopic intraoperative spot views of the lumbar spine.  Images demonstrate  pedicle screws, stabilization bars and interbody spacers from L3-L5.  Laminectomy defect at the fusion levels also seen. Hardware appears intact.  No fracture  is identified.  IMPRESSION: L3-5 PLIF   Original Report Authenticated By: Holley Dexter, M.D.     Antibiotics:  Anti-infectives   Start     Dose/Rate Route Frequency Ordered Stop   05/28/12 1500  ceFAZolin (ANCEF) IVPB 1 g/50 mL premix     1 g 100 mL/hr over 30 Minutes Intravenous Every 8 hours 05/28/12 1435 05/28/12 2340   05/28/12 0919  bacitracin 50,000 Units in sodium chloride irrigation 0.9 % 500 mL irrigation  Status:  Discontinued       As needed 05/28/12 0920 05/28/12 1311   05/28/12 0808  bacitracin 86578 UNITS injection    Comments:  WATSON, LAUREN: cabinet override      05/28/12 0808 05/28/12 2014   05/28/12 0600  ceFAZolin (ANCEF) IVPB 2 g/50 mL premix     2 g 100 mL/hr over 30 Minutes Intravenous On call to O.R. 05/27/12 1406 05/28/12 0843      Discharge Exam: Blood pressure 124/66, pulse 66, temperature 97.4 F (36.3 C), temperature source Oral, resp. rate 18, height 6' (1.829 m), weight 90.447 kg (199 lb 6.4 oz), SpO2 96.00%. Neurologic: Grossly normal Incision clean dry and intact  Discharge Medications:     Medication List    STOP taking these medications       meloxicam 15 MG tablet  Commonly known as:  MOBIC      TAKE these medications       alendronate 70 MG tablet  Commonly known as:  FOSAMAX  Take 70 mg by mouth every 7 (seven) days. Take with a full glass of water on an empty stomach. Take on Fridays     aspirin EC 81 MG tablet  Take 81 mg by mouth daily with supper.     b complex vitamins tablet  Take 1 tablet by mouth daily with lunch.     CALTRATE 600+D PO  Take 1 tablet by mouth 2 (two) times daily. At lunch and dinner     CoQ10 200 MG Caps  Take 200 mg by mouth daily with lunch.     methocarbamol 500 MG tablet  Commonly known as:  ROBAXIN  Take 1 tablet (500 mg total) by mouth every 6 (six) hours as needed.     multivitamin with minerals Tabs  Take 1 tablet by mouth daily with lunch. Centrum     omeprazole 40 MG capsule  Commonly known as:  PRILOSEC  Take 40 mg by mouth daily.     Oxycodone HCl 10 MG Tabs  Take 1 tablet (10 mg total) by mouth every 6 (six) hours as needed.     PARoxetine 40 MG tablet  Commonly known as:  PAXIL  Take 40 mg by mouth daily.     simvastatin 20 MG tablet  Commonly known as:  ZOCOR  Take 20 mg by mouth daily with supper.     SYSTANE OP  Apply 1 drop to eye daily as needed (for dry eyes).        Disposition: Home   Final Dx: PLIF L3-4 L4-5      Discharge Orders   Future Orders Complete By Expires     Call MD for:  difficulty breathing, headache or visual disturbances  As directed     Call MD for:  persistant nausea and vomiting  As directed     Call MD for:  redness, tenderness, or signs of infection (pain, swelling, redness, odor or green/yellow discharge around incision site)  As directed     Call MD for:  severe uncontrolled pain  As directed     Call MD for:  temperature >100.4  As directed     Diet - low sodium heart healthy  As directed     Discharge instructions  As directed     Comments:      No heavy lifting, no bending or twisting. May shower.    Increase activity slowly  As directed           Signed: Luvern Mcisaac S 05/29/2012, 11:27 AM

## 2012-05-29 NOTE — Evaluation (Signed)
Physical Therapy Evaluation Patient Details Name: Timothy Osborn MRN: [id number] DOB: 1929/01/25 Today's Date: 05/29/2012 Time: 8413-2440 PT Time Calculation (min): 29 min  PT Assessment / Plan / Recommendation Clinical Impression  Pt presents s/p interbody fusion L4-5 and L3-4 and lumbar laminectomy L3-4 and L4-5.  Pt tolerated mobility and ambulation very well.  Mobility not limited by pain or fatigue. Recommend acute PT to ensure pt saftey due to impulsivity and inexperience with back precautions.    PT Assessment  Patient needs continued PT services (1 session if not already d/c)    Follow Up Recommendations  No PT follow up    Barriers to Discharge None      Frequency Min 5X/week    Precautions / Restrictions Precautions Precautions: Back Precaution Booklet Issued: Yes (comment) (given handout) Precaution Comments: Reviewed with pt multiple times in session.  Demonstrated and verbalized understanding. Required Braces or Orthoses: Spinal Brace Spinal Brace: Lumbar corset;Applied in sitting position   Pertinent Vitals/Pain Pain 4/10 at rest      Mobility  Bed Mobility Bed Mobility: Rolling Right;Supine to Sit;Sitting - Scoot to Edge of Bed Rolling Right: 5: Supervision Supine to Sit: 5: Supervision Sitting - Scoot to Edge of Bed: 5: Supervision Details for Bed Mobility Assistance: verbal and tactile cues for back precautions  Transfers Transfers: Sit to Stand;Stand to Sit Sit to Stand: 5: Supervision Stand to Sit: 5: Supervision Details for Transfer Assistance: verbal and tactile cues for back precautions. Ambulation/Gait Ambulation/Gait Assistance: 5: Supervision Ambulation Distance (Feet): 600 Feet Assistive device: None Ambulation/Gait Assistance Details: First time up since surgery.  Mild impulsivity needing supervision to ensure saftey. Gait Pattern: Within Functional Limits Gait velocity: normal General Gait Details: normalized gait mechanics allowing  adequate efficiency of ambulation. Stairs: Yes Stairs Assistance: 6: Modified independent (Device/Increase time) Stair Management Technique: Two rails;Alternating pattern Number of Stairs: 7     PT Diagnosis: Acute pain  PT Problem List: Decreased knowledge of precautions PT Treatment Interventions: Patient/family education   PT Goals Acute Rehab PT Goals PT Goal Formulation: With patient Time For Goal Achievement: 06/05/12 Potential to Achieve Goals: Good Pt will Roll Supine to Right Side: Independently PT Goal: Rolling Supine to Right Side - Progress: Goal set today Pt will go Supine/Side to Sit: Independently PT Goal: Supine/Side to Sit - Progress: Goal set today Pt will go Sit to Stand: Independently PT Goal: Sit to Stand - Progress: Goal set today Pt will go Stand to Sit: Independently PT Goal: Stand to Sit - Progress: Goal set today Pt will Ambulate: >150 feet;Independently PT Goal: Ambulate - Progress: Goal set today Pt will Go Up / Down Stairs: Independently;Flight PT Goal: Up/Down Stairs - Progress: Goal set today  Visit Information  Last PT Received On: 05/29/12 Assistance Needed: +1    Subjective Data  Subjective: Pt reports that he is feeling good and "not in too much pain" Patient Stated Goal: Go home    Prior Functioning  Home Living Lives With: Spouse Available Help at Discharge: Family;Neighbor;Friend(s) Type of Home: House Home Access: Stairs to enter [employer] of Steps: 3 Entrance Stairs-Rails: Can reach both Home Layout: Two level Alternate Level Stairs-Rails: Can reach both Bathroom Toilet: Handicapped height Home Adaptive Equipment: Straight cane;Walker - rolling Additional Comments: cane and walker from prior history of knee surgery.  Hasn't used either in over 1 year Prior Function Level of Independence: Independent Able to Take Stairs?: Reciprically Vocation: Retired Comments: very active older  adult Communication Communication: No  difficulties    Cognition  Cognition Arousal/Alertness: Awake/alert Behavior During Therapy: Impulsive (mild impulsivity) Overall Cognitive Status: Within Functional Limits for tasks assessed    Extremity/Trunk Assessment Right Lower Extremity Assessment RLE ROM/Strength/Tone: [facility] for tasks assessed Left Lower Extremity Assessment LLE ROM/Strength/Tone: WFL for tasks assessed   Balance Balance Balance Assessed: Yes (minimal instability with adequate reactions to correct during ambulation.)  End of Session PT - End of Session Equipment Utilized During Treatment: Gait belt Activity Tolerance: Patient tolerated treatment well Patient left: in chair;with call bell/phone within reach  GP     Payton Doughty 05/29/2012, 9:16 AM Payton Doughty, PT Student

## 2012-05-29 NOTE — Progress Notes (Signed)
Pt discharged home with wife incision clean approximate no redness or s/s of infection noted . Incision care and back precaution reinforced pt verbalized good understanding.  Medication script given and told to pick up other medication at [facility] . Saline lock removed .intact slight bleeding noted. Condition at discharge is stable. Azzie Roup RN

## 2012-05-29 NOTE — Plan of Care (Signed)
Problem: Phase II Progression Outcomes Goal: Progress activity as tolerated unless otherwise ordered Outcome: Progressing Activity tolerated well, but needs reinforcement of precautions.

## 2012-05-29 NOTE — Evaluation (Signed)
I have read and agree with the below evaluation note. Ivonne Andrew PT, DPT Pager: [phone]

## 2012-05-29 NOTE — Plan of Care (Signed)
Problem: Phase III Progression Outcomes Goal: Discharge plan remains appropriate-arrangements made No follow up OT or equipment recommended at this time

## 2012-05-29 NOTE — Evaluation (Signed)
Occupational Therapy Evaluation Patient Details Name: Timothy Osborn MRN: [id number] DOB: [birth year] Today's Date: 05/29/2012 Time: 4098-1191 OT Time Calculation (min): 29 min  OT Assessment / Plan / Recommendation Clinical Impression  Pt doing very well following L3-5 PLIF. Pt is at set up/sup level with UB ADLs, min A with LB ADLs and sup with ADL mobility. Pt has necesaary DME and A/E at home and will have 24 hour sup/assist. All education completed and no further acute or follow up OT services needed at this time    OT Assessment  Patient does not need any further OT services    Follow Up Recommendations  No OT follow up;Supervision/Assistance - 24 hour    Barriers to Discharge  None    Equipment Recommendations   None   Recommendations for Other Services  None  Frequency       Precautions / Restrictions Precautions Precautions: Back Precaution Booklet Issued: Yes (comment) Precaution Comments: pt able to recall 2/3 bac precautions, reviewed all back precautions with pt. Pt able to verbalize and demo understanding Required Braces or Orthoses: Spinal Brace Spinal Brace: Lumbar corset;Applied in sitting position Restrictions Weight Bearing Restrictions: No   Pertinent Vitals/Pain 1/10 back    ADL  Grooming: Performed;Wash/dry hands;Wash/dry face;Supervision/safety Where Assessed - Grooming: Supported standing Upper Body Bathing: Simulated;Supervision/safety;Set up Lower Body Bathing: Simulated;Minimal assistance Upper Body Dressing: Performed;Supervision/safety;Set up Lower Body Dressing: Performed;Minimal assistance Toilet Transfer: Performed;Supervision/safety Toilet Transfer Method: Sit to stand Toilet Transfer Equipment: Regular height toilet;Grab bars;Raised toilet seat with arms (or 3-in-1 over toilet) Toileting - Clothing Manipulation and Hygiene: Performed;Supervision/safety Where Assessed - Glass blower/designer Manipulation and Hygiene: Standing Tub/Shower  Transfer: Performed;Supervision/safety;Min guard Web designer Method: Science writer: Grab bars;Shower seat without back;Walk in shower Equipment Used: Back brace;Long-handled shoe horn;Long-handled sponge;Reacher;Sock aid Transfers/Ambulation Related to ADLs: cues to maintain back precautions ADL Comments: Pt and wife state that they have ADL A/E kit at home due to wife's rvious hip surgery, reviewed use of equipment with pt    OT Diagnosis:    OT Problem List:   OT Treatment Interventions:     OT Goals    Visit Information  Last OT Received On: 05/29/12    Subjective Data  Subjective: " I don't really have much pain and I feel good ' Patient Stated Goal: To return home   Prior Functioning     Home Living Lives With: Spouse Available Help at Discharge: Family;Neighbor;Friend(s) Type of Home: House Home Access: Stairs to enter [employer] of Steps: 3 Entrance Stairs-Rails: Can reach both Home Layout: Two level Alternate Level Stairs-Rails: Can reach both Bathroom Shower/Tub: Health visitor: Handicapped height Home Adaptive Equipment: Straight cane;Walker - rolling;Bedside commode/3-in-1;Reacher;Long-handled shoehorn;Long-handled sponge;Sock aid Additional Comments: cane and walker from prior history of knee surgery.  Hasn't used either in over 1 year Prior Function Level of Independence: Independent Able to Take Stairs?: Reciprically Driving: Yes Vocation: Retired Comments: very Sport and exercise psychologist: No difficulties Dominant Hand: Right         Vision/Perception Vision - History Baseline Vision: Wears glasses only for reading Patient Visual Report: No change from baseline Perception Perception: Within Functional Limits   Cognition  Cognition Arousal/Alertness: Awake/alert Behavior During Therapy: Impulsive Overall Cognitive Status: Within Functional Limits for tasks  assessed    Extremity/Trunk Assessment Right Upper Extremity Assessment RUE ROM/Strength/Tone: WFL for tasks assessed RUE Sensation: WFL - Light Touch RUE Coordination: WFL - gross/fine motor Left Upper Extremity Assessment LUE ROM/Strength/Tone: WFL for  tasks assessed LUE Sensation: WFL - Light Touch LUE Coordination: WFL - gross/fine motor     Mobility Bed Mobility Bed Mobility: Not assessed Transfers Transfers: Sit to Stand Sit to Stand: 5: Supervision;From bed;From chair/3-in-1;From toilet;Without upper extremity assist Stand to Sit: 5: Supervision;To toilet;To chair/3-in-1;To bed;Without upper extremity assist Details for Transfer Assistance: verbal and tactile cues for back precautions.     Exercise     Balance Balance Balance Assessed: Yes Static Standing Balance Static Standing - Balance Support: No upper extremity supported;During functional activity Static Standing - Level of Assistance: 6: Modified independent (Device/Increase time) Dynamic Standing Balance Dynamic Standing - Balance Support: During functional activity;No upper extremity supported Dynamic Standing - Level of Assistance: 5: Stand by assistance   End of Session OT - End of Session Equipment Utilized During Treatment: Back brace;Other (comment) (ADL A/E, 3 in 1) Activity Tolerance: Patient tolerated treatment well Patient left: in chair;with call bell/phone within reach;with family/visitor present  GO     Galen Manila 05/29/2012, 2:05 PM

## 2012-05-30 MED FILL — Heparin Sodium (Porcine) Inj 1000 Unit/ML: INTRAMUSCULAR | Qty: 30 | Status: AC

## 2012-05-30 MED FILL — Sodium Chloride IV Soln 0.9%: INTRAVENOUS | Qty: 1000 | Status: AC

## 2012-05-30 NOTE — Care Management Note (Signed)
    Page 1 of 1   05/30/2012     8:47:08 AM   CARE MANAGEMENT NOTE 05/30/2012  Patient:  Timothy Osborn, Timothy Osborn   Account Number:  [id number]  Date Initiated:  05/28/2012  Documentation initiated by:  [facility]  Subjective/Objective Assessment:   admitted postop PLIF L3-4, L4-5     Action/Plan:   PT/Ot evals  - no follow up recommended, patient has rolling walker at home   Anticipated DC Date:  05/31/2012   Anticipated DC Plan:  HOME/SELF CARE      DC Planning Services  CM consult      Choice offered to / List presented to:             Status of service:  Completed, signed off Medicare Important Message given?   (If response is "NO", the following Medicare IM given date fields will be blank) Date Medicare IM given:   Date Additional Medicare IM given:    Discharge Disposition:  HOME/SELF CARE  Per UR Regulation:  Reviewed for med. necessity/level of care/duration of stay  If discussed at Long Length of Stay Meetings, dates discussed:    Comments:

## 2012-06-13 ENCOUNTER — Other Ambulatory Visit: Payer: Self-pay | Admitting: Neurological Surgery

## 2012-06-13 DIAGNOSIS — M545 Low back pain: Secondary | ICD-10-CM

## 2012-06-15 ENCOUNTER — Ambulatory Visit
Admission: RE | Admit: 2012-06-15 | Discharge: 2012-06-15 | Disposition: A | Discharge status: Home / Self Care | Payer: Medicare Other | Source: Ambulatory Visit | Attending: Neurological Surgery | Admitting: Neurological Surgery

## 2012-06-15 DIAGNOSIS — M545 Low back pain: Secondary | ICD-10-CM

## 2012-06-15 MED ORDER — GADOBENATE DIMEGLUMINE 529 MG/ML IV SOLN
19.0000 mL | Freq: Once | INTRAVENOUS | Status: AC | PRN
  Administered 2012-06-15: 19 mL via INTRAVENOUS

## 2012-06-16 ENCOUNTER — Encounter (HOSPITAL_COMMUNITY)
Admission: RE | Disposition: A | Discharge status: Home / Self Care | Payer: Self-pay | Source: Ambulatory Visit | Attending: Neurological Surgery

## 2012-06-16 ENCOUNTER — Ambulatory Visit (HOSPITAL_COMMUNITY): Payer: Medicare Other | Admitting: Anesthesiology

## 2012-06-16 ENCOUNTER — Encounter (HOSPITAL_COMMUNITY): Payer: Self-pay

## 2012-06-16 ENCOUNTER — Inpatient Hospital Stay (HOSPITAL_COMMUNITY)
Admission: RE | Admit: 2012-06-16 | Discharge: 2012-06-20 | DRG: 029 | Disposition: A | Discharge status: Home / Self Care | Payer: Medicare Other | Source: Ambulatory Visit | Attending: Neurological Surgery | Admitting: Neurological Surgery

## 2012-06-16 ENCOUNTER — Encounter (HOSPITAL_COMMUNITY): Payer: Self-pay | Admitting: Anesthesiology

## 2012-06-16 DIAGNOSIS — G96198 Other disorders of meninges, not elsewhere classified: Secondary | ICD-10-CM | POA: Diagnosis present

## 2012-06-16 DIAGNOSIS — F05 Delirium due to known physiological condition: Secondary | ICD-10-CM | POA: Diagnosis not present

## 2012-06-16 DIAGNOSIS — Z87891 Personal history of nicotine dependence: Secondary | ICD-10-CM

## 2012-06-16 DIAGNOSIS — Z96659 Presence of unspecified artificial knee joint: Secondary | ICD-10-CM

## 2012-06-16 DIAGNOSIS — Z79899 Other long term (current) drug therapy: Secondary | ICD-10-CM

## 2012-06-16 DIAGNOSIS — Z8249 Family history of ischemic heart disease and other diseases of the circulatory system: Secondary | ICD-10-CM

## 2012-06-16 DIAGNOSIS — K219 Gastro-esophageal reflux disease without esophagitis: Secondary | ICD-10-CM | POA: Diagnosis present

## 2012-06-16 DIAGNOSIS — F329 Major depressive disorder, single episode, unspecified: Secondary | ICD-10-CM | POA: Diagnosis present

## 2012-06-16 DIAGNOSIS — F411 Generalized anxiety disorder: Secondary | ICD-10-CM | POA: Diagnosis present

## 2012-06-16 DIAGNOSIS — Y831 Surgical operation with implant of artificial internal device as the cause of abnormal reaction of the patient, or of later complication, without mention of misadventure at the time of the procedure: Secondary | ICD-10-CM | POA: Diagnosis present

## 2012-06-16 DIAGNOSIS — Z981 Arthrodesis status: Secondary | ICD-10-CM

## 2012-06-16 DIAGNOSIS — F429 Obsessive-compulsive disorder, unspecified: Secondary | ICD-10-CM | POA: Diagnosis present

## 2012-06-16 DIAGNOSIS — Z8551 Personal history of malignant neoplasm of bladder: Secondary | ICD-10-CM

## 2012-06-16 DIAGNOSIS — Z7982 Long term (current) use of aspirin: Secondary | ICD-10-CM

## 2012-06-16 DIAGNOSIS — G8929 Other chronic pain: Secondary | ICD-10-CM | POA: Diagnosis present

## 2012-06-16 DIAGNOSIS — IMO0002 Reserved for concepts with insufficient information to code with codable children: Secondary | ICD-10-CM | POA: Diagnosis present

## 2012-06-16 DIAGNOSIS — G9741 Accidental puncture or laceration of dura during a procedure: Secondary | ICD-10-CM | POA: Diagnosis present

## 2012-06-16 DIAGNOSIS — Z923 Personal history of irradiation: Secondary | ICD-10-CM

## 2012-06-16 DIAGNOSIS — F3289 Other specified depressive episodes: Secondary | ICD-10-CM | POA: Diagnosis present

## 2012-06-16 DIAGNOSIS — E7800 Pure hypercholesterolemia, unspecified: Secondary | ICD-10-CM | POA: Diagnosis present

## 2012-06-16 DIAGNOSIS — G969 Disorder of central nervous system, unspecified: Principal | ICD-10-CM | POA: Diagnosis present

## 2012-06-16 DIAGNOSIS — M810 Age-related osteoporosis without current pathological fracture: Secondary | ICD-10-CM | POA: Diagnosis present

## 2012-06-16 HISTORY — PX: LUMBAR WOUND DEBRIDEMENT: SHX1988

## 2012-06-16 LAB — BASIC METABOLIC PANEL
Calcium: 8.8 mg/dL (ref 8.4–10.5)
GFR calc non Af Amer: 64 mL/min — ABNORMAL LOW (ref >90)
Glucose, Bld: 89 mg/dL (ref 70–99)
Sodium: 137 mEq/L (ref 135–145)

## 2012-06-16 LAB — CBC
MCH: 30.7 pg (ref 26.0–34.0)
Platelets: 408 10*3/uL — ABNORMAL HIGH (ref 150–400)
RBC: 4.01 MIL/uL — ABNORMAL LOW (ref 4.22–5.81)
WBC: 6.7 10*3/uL (ref 4.0–10.5)

## 2012-06-16 LAB — GRAM STAIN: Gram Stain: NONE SEEN

## 2012-06-16 SURGERY — LUMBAR WOUND DEBRIDEMENT
Anesthesia: General | Wound class: Dirty or Infected

## 2012-06-16 MED ORDER — POTASSIUM CHLORIDE IN NACL 20-0.9 MEQ/L-% IV SOLN
INTRAVENOUS | Status: DC
  Administered 2012-06-16 – 2012-06-19 (×3): via INTRAVENOUS
  Filled 2012-06-16 (×8): qty 1000

## 2012-06-16 MED ORDER — PROPOFOL 10 MG/ML IV BOLUS
INTRAVENOUS | Status: DC | PRN
  Administered 2012-06-16: 100 mg via INTRAVENOUS

## 2012-06-16 MED ORDER — ROCURONIUM BROMIDE 100 MG/10ML IV SOLN
INTRAVENOUS | Status: DC | PRN
  Administered 2012-06-16: 40 mg via INTRAVENOUS

## 2012-06-16 MED ORDER — PHENOL 1.4 % MT LIQD: 1.0000 | OROMUCOSAL | Status: DC | PRN

## 2012-06-16 MED ORDER — ALENDRONATE SODIUM 70 MG PO TABS: 70.0000 mg | ORAL_TABLET | ORAL | Status: DC

## 2012-06-16 MED ORDER — OXYCODONE HCL 5 MG PO TABS
ORAL_TABLET | ORAL | Status: AC
  Administered 2012-06-16: 10 mg
  Filled 2012-06-16: qty 2

## 2012-06-16 MED ORDER — FENTANYL CITRATE 0.05 MG/ML IJ SOLN
INTRAMUSCULAR | Status: AC
  Administered 2012-06-16: 50 ug via INTRAVENOUS
  Filled 2012-06-16: qty 2

## 2012-06-16 MED ORDER — ONDANSETRON HCL 4 MG/2ML IJ SOLN
INTRAMUSCULAR | Status: DC | PRN
  Administered 2012-06-16: 4 mg via INTRAVENOUS

## 2012-06-16 MED ORDER — SENNA 8.6 MG PO TABS
1.0000 | ORAL_TABLET | Freq: Two times a day (BID) | ORAL | Status: DC
  Administered 2012-06-16 – 2012-06-20 (×7): 8.6 mg via ORAL
  Filled 2012-06-16 (×9): qty 1

## 2012-06-16 MED ORDER — METHOCARBAMOL 500 MG PO TABS
500.0000 mg | ORAL_TABLET | Freq: Four times a day (QID) | ORAL | Status: DC | PRN
  Administered 2012-06-16 – 2012-06-20 (×7): 500 mg via ORAL
  Filled 2012-06-16 (×7): qty 1

## 2012-06-16 MED ORDER — PANTOPRAZOLE SODIUM 40 MG PO TBEC
40.0000 mg | DELAYED_RELEASE_TABLET | Freq: Every day | ORAL | Status: DC
  Administered 2012-06-16 – 2012-06-20 (×4): 40 mg via ORAL
  Filled 2012-06-16 (×5): qty 1

## 2012-06-16 MED ORDER — SODIUM CHLORIDE 0.9 % IJ SOLN: 3.0000 mL | INTRAMUSCULAR | Status: DC | PRN

## 2012-06-16 MED ORDER — ONDANSETRON HCL 4 MG/2ML IJ SOLN: 4.0000 mg | INTRAMUSCULAR | Status: DC | PRN

## 2012-06-16 MED ORDER — OXYCODONE HCL 5 MG PO TABS
10.0000 mg | ORAL_TABLET | ORAL | Status: DC | PRN
  Administered 2012-06-16 – 2012-06-20 (×2): 10 mg via ORAL
  Filled 2012-06-16 (×2): qty 2

## 2012-06-16 MED ORDER — FENTANYL CITRATE 0.05 MG/ML IJ SOLN
INTRAMUSCULAR | Status: DC | PRN
  Administered 2012-06-16: 100 ug via INTRAVENOUS
  Administered 2012-06-16: 50 ug via INTRAVENOUS

## 2012-06-16 MED ORDER — ACETAMINOPHEN 10 MG/ML IV SOLN
1000.0000 mg | Freq: Four times a day (QID) | INTRAVENOUS | Status: AC
  Administered 2012-06-16 – 2012-06-17 (×3): 1000 mg via INTRAVENOUS
  Filled 2012-06-16 (×3): qty 100

## 2012-06-16 MED ORDER — LACTATED RINGERS IV SOLN
INTRAVENOUS | Status: DC | PRN
  Administered 2012-06-16 (×2): via INTRAVENOUS

## 2012-06-16 MED ORDER — SODIUM CHLORIDE 0.9 % IJ SOLN
3.0000 mL | Freq: Two times a day (BID) | INTRAMUSCULAR | Status: DC
  Administered 2012-06-16 – 2012-06-19 (×5): 3 mL via INTRAVENOUS

## 2012-06-16 MED ORDER — FENTANYL CITRATE 0.05 MG/ML IJ SOLN
25.0000 ug | INTRAMUSCULAR | Status: DC | PRN
  Administered 2012-06-16: 50 ug via INTRAVENOUS

## 2012-06-16 MED ORDER — GLYCOPYRROLATE 0.2 MG/ML IJ SOLN
INTRAMUSCULAR | Status: DC | PRN
  Administered 2012-06-16: .4 mg via INTRAVENOUS

## 2012-06-16 MED ORDER — SODIUM CHLORIDE 0.9 % IV SOLN: 250.0000 mL | INTRAVENOUS | Status: DC

## 2012-06-16 MED ORDER — NEOSTIGMINE METHYLSULFATE 1 MG/ML IJ SOLN
INTRAMUSCULAR | Status: DC | PRN
  Administered 2012-06-16: 4 mg via INTRAVENOUS

## 2012-06-16 MED ORDER — LIDOCAINE HCL (CARDIAC) 20 MG/ML IV SOLN
INTRAVENOUS | Status: DC | PRN
  Administered 2012-06-16: 60 mg via INTRAVENOUS

## 2012-06-16 MED ORDER — VANCOMYCIN HCL IN DEXTROSE 750-5 MG/150ML-% IV SOLN
750.0000 mg | Freq: Two times a day (BID) | INTRAVENOUS | Status: DC
  Administered 2012-06-17 – 2012-06-18 (×3): 750 mg via INTRAVENOUS
  Filled 2012-06-16 (×5): qty 150

## 2012-06-16 MED ORDER — ZOLPIDEM TARTRATE 5 MG PO TABS: 5.0000 mg | ORAL_TABLET | Freq: Every evening | ORAL | Status: DC | PRN

## 2012-06-16 MED ORDER — MORPHINE SULFATE 2 MG/ML IJ SOLN
INTRAMUSCULAR | Status: AC
  Administered 2012-06-16: 2 mg via INTRAVENOUS
  Filled 2012-06-16: qty 1

## 2012-06-16 MED ORDER — ACETAMINOPHEN 650 MG RE SUPP: 650.0000 mg | RECTAL | Status: DC | PRN

## 2012-06-16 MED ORDER — HEMOSTATIC AGENTS (NO CHARGE) OPTIME
TOPICAL | Status: DC | PRN
  Administered 2012-06-16: 1 via TOPICAL

## 2012-06-16 MED ORDER — ASPIRIN EC 81 MG PO TBEC
81.0000 mg | DELAYED_RELEASE_TABLET | Freq: Every day | ORAL | Status: DC
  Administered 2012-06-18 – 2012-06-19 (×2): 81 mg via ORAL
  Filled 2012-06-16 (×4): qty 1

## 2012-06-16 MED ORDER — SODIUM CHLORIDE 0.9 % IV SOLN
INTRAVENOUS | Status: AC
  Filled 2012-06-16: qty 500

## 2012-06-16 MED ORDER — ACETAMINOPHEN 10 MG/ML IV SOLN
INTRAVENOUS | Status: AC
  Administered 2012-06-16: 1000 mg via INTRAVENOUS
  Filled 2012-06-16: qty 100

## 2012-06-16 MED ORDER — BACITRACIN 50000 UNITS IM SOLR
INTRAMUSCULAR | Status: AC
  Filled 2012-06-16: qty 1

## 2012-06-16 MED ORDER — PHENYLEPHRINE HCL 10 MG/ML IJ SOLN
10.0000 mg | INTRAVENOUS | Status: DC | PRN
  Administered 2012-06-16: 20 ug/min via INTRAVENOUS

## 2012-06-16 MED ORDER — VANCOMYCIN HCL IN DEXTROSE 1-5 GM/200ML-% IV SOLN
INTRAVENOUS | Status: AC
  Administered 2012-06-17: 750 mg via INTRAVENOUS
  Filled 2012-06-16: qty 200

## 2012-06-16 MED ORDER — PAROXETINE HCL 20 MG PO TABS
40.0000 mg | ORAL_TABLET | Freq: Every day | ORAL | Status: DC
  Administered 2012-06-18 – 2012-06-20 (×3): 40 mg via ORAL
  Filled 2012-06-16 (×4): qty 2

## 2012-06-16 MED ORDER — DEXTROSE 5 % IV SOLN
500.0000 mg | Freq: Four times a day (QID) | INTRAVENOUS | Status: DC | PRN
  Filled 2012-06-16: qty 5

## 2012-06-16 MED ORDER — ADULT MULTIVITAMIN W/MINERALS CH
1.0000 | ORAL_TABLET | Freq: Every day | ORAL | Status: DC
  Administered 2012-06-18 – 2012-06-19 (×2): 1 via ORAL
  Filled 2012-06-16 (×4): qty 1

## 2012-06-16 MED ORDER — DEXTROSE 5 % IV SOLN
1.0000 g | INTRAVENOUS | Status: DC
  Filled 2012-06-16 (×2): qty 10

## 2012-06-16 MED ORDER — MENTHOL 3 MG MT LOZG: 1.0000 | LOZENGE | OROMUCOSAL | Status: DC | PRN

## 2012-06-16 MED ORDER — THROMBIN 5000 UNITS EX SOLR
CUTANEOUS | Status: DC | PRN
  Administered 2012-06-16 (×2): 5000 [IU] via TOPICAL

## 2012-06-16 MED ORDER — MORPHINE SULFATE 2 MG/ML IJ SOLN
1.0000 mg | INTRAMUSCULAR | Status: DC | PRN
  Administered 2012-06-16: 1 mg via INTRAVENOUS
  Filled 2012-06-16: qty 1

## 2012-06-16 MED ORDER — DEXTROSE 5 % IV SOLN
1.0000 g | INTRAVENOUS | Status: DC | PRN
  Administered 2012-06-16: 1 g via INTRAVENOUS

## 2012-06-16 MED ORDER — 0.9 % SODIUM CHLORIDE (POUR BTL) OPTIME
TOPICAL | Status: DC | PRN
  Administered 2012-06-16: 1000 mL

## 2012-06-16 MED ORDER — ONDANSETRON HCL 4 MG/2ML IJ SOLN: 4.0000 mg | Freq: Once | INTRAMUSCULAR | Status: DC | PRN

## 2012-06-16 MED ORDER — ACETAMINOPHEN 325 MG PO TABS
650.0000 mg | ORAL_TABLET | ORAL | Status: DC | PRN
  Administered 2012-06-17 – 2012-06-18 (×4): 650 mg via ORAL
  Filled 2012-06-16 (×4): qty 2

## 2012-06-16 MED ORDER — SODIUM CHLORIDE 0.9 % IR SOLN
Status: DC | PRN
  Administered 2012-06-16: 18:00:00

## 2012-06-16 SURGICAL SUPPLY — 45 items
APL SKNCLS STERI-STRIP NONHPOA (GAUZE/BANDAGES/DRESSINGS) ×1
APL SRG 60D 8 XTD TIP BNDBL (TIP) ×1
BAG DECANTER FOR FLEXI CONT (MISCELLANEOUS) ×4 IMPLANT
BENZOIN TINCTURE PRP APPL 2/3 (GAUZE/BANDAGES/DRESSINGS) ×2 IMPLANT
CANISTER SUCTION 2500CC (MISCELLANEOUS) ×2 IMPLANT
CLOTH BEACON ORANGE TIMEOUT ST (SAFETY) ×2 IMPLANT
DRAPE LAPAROTOMY 100X72X124 (DRAPES) ×2 IMPLANT
DRAPE POUCH INSTRU U-SHP 10X18 (DRAPES) ×2 IMPLANT
DRESSING TELFA 8X3 (GAUZE/BANDAGES/DRESSINGS) ×2 IMPLANT
DRSG OPSITE 4X5.5 SM (GAUZE/BANDAGES/DRESSINGS) ×2 IMPLANT
DURAPREP 26ML APPLICATOR (WOUND CARE) IMPLANT
DURAPREP 6ML APPLICATOR 50/CS (WOUND CARE) IMPLANT
DURASEAL APPLICATOR TIP (TIP) ×1 IMPLANT
DURASEAL SPINE SEALANT 3ML (MISCELLANEOUS) ×1 IMPLANT
ELECT REM PT RETURN 9FT ADLT (ELECTROSURGICAL) ×2
ELECTRODE REM PT RTRN 9FT ADLT (ELECTROSURGICAL) ×1 IMPLANT
GAUZE SPONGE 4X4 16PLY XRAY LF (GAUZE/BANDAGES/DRESSINGS) IMPLANT
GLOVE BIOGEL M 8.0 STRL (GLOVE) ×2 IMPLANT
GOWN BRE IMP SLV AUR LG STRL (GOWN DISPOSABLE) IMPLANT
GOWN BRE IMP SLV AUR XL STRL (GOWN DISPOSABLE) IMPLANT
GOWN STRL REIN 2XL LVL4 (GOWN DISPOSABLE) ×2 IMPLANT
KIT BASIN OR (CUSTOM PROCEDURE TRAY) ×2 IMPLANT
KIT ROOM TURNOVER OR (KITS) ×2 IMPLANT
NDL HYPO 18GX1.5 BLUNT FILL (NEEDLE) IMPLANT
NDL HYPO 25X1 1.5 SAFETY (NEEDLE) ×1 IMPLANT
NDL SPNL 20GX3.5 QUINCKE YW (NEEDLE) IMPLANT
NEEDLE HYPO 18GX1.5 BLUNT FILL (NEEDLE) IMPLANT
NEEDLE HYPO 25X1 1.5 SAFETY (NEEDLE) ×2 IMPLANT
NEEDLE SPNL 20GX3.5 QUINCKE YW (NEEDLE) IMPLANT
NS IRRIG 1000ML POUR BTL (IV SOLUTION) ×2 IMPLANT
PACK LAMINECTOMY NEURO (CUSTOM PROCEDURE TRAY) ×2 IMPLANT
PAD ARMBOARD 7.5X6 YLW CONV (MISCELLANEOUS) ×6 IMPLANT
STRIP CLOSURE SKIN 1/2X4 (GAUZE/BANDAGES/DRESSINGS) ×2 IMPLANT
SUT BONE WAX W31G (SUTURE) ×1 IMPLANT
SUT VIC AB 0 CT1 18XCR BRD8 (SUTURE) ×1 IMPLANT
SUT VIC AB 0 CT1 8-18 (SUTURE) ×2
SUT VIC AB 2-0 CP2 18 (SUTURE) ×2 IMPLANT
SUT VIC AB 3-0 SH 8-18 (SUTURE) ×2 IMPLANT
SWAB CULTURE LIQ STUART DBL (MISCELLANEOUS) ×2 IMPLANT
SYR 20ML ECCENTRIC (SYRINGE) ×2 IMPLANT
SYR 3ML LL SCALE MARK (SYRINGE) IMPLANT
TOWEL OR 17X24 6PK STRL BLUE (TOWEL DISPOSABLE) ×2 IMPLANT
TOWEL OR 17X26 10 PK STRL BLUE (TOWEL DISPOSABLE) ×2 IMPLANT
TUBE ANAEROBIC SPECIMEN COL (MISCELLANEOUS) ×2 IMPLANT
WATER STERILE IRR 1000ML POUR (IV SOLUTION) ×2 IMPLANT

## 2012-06-16 NOTE — Progress Notes (Signed)
Pt unable to void at 2230, last time voided was 1300.   Bladder scanned, showing 431 cc retained.   I&O cath done at 2245, pt DTV by 0445.

## 2012-06-16 NOTE — Anesthesia Preprocedure Evaluation (Signed)
Anesthesia Evaluation  Patient identified by MRN, date of birth, ID band Patient awake    Reviewed: Allergy & Precautions, H&P , NPO status , Patient's Chart, lab work & pertinent test results  Airway Mallampati: II  Neck ROM: full    Dental   Pulmonary former smoker,          Cardiovascular     Neuro/Psych Anxiety Depression    GI/Hepatic hiatal hernia, GERD-  ,  Endo/Other    Renal/GU      Musculoskeletal  (+) Arthritis -,   Abdominal   Peds  Hematology   Anesthesia Other Findings   Reproductive/Obstetrics                           Anesthesia Physical Anesthesia Plan  ASA: II  Anesthesia Plan: General   Post-op Pain Management:    Induction: Intravenous  Airway Management Planned: Oral ETT  Additional Equipment:   Intra-op Plan:   Post-operative Plan: Extubation in OR  Informed Consent: I have reviewed the patients History and Physical, chart, labs and discussed the procedure including the risks, benefits and alternatives for the proposed anesthesia with the patient or authorized representative who has indicated his/her understanding and acceptance.     Plan Discussed with: CRNA, Anesthesiologist and Surgeon  Anesthesia Plan Comments:         Anesthesia Quick Evaluation

## 2012-06-16 NOTE — Op Note (Signed)
06/16/2012  6:21 PM  PATIENT:  Timothy Osborn  79 y.o. male  PRE-OPERATIVE DIAGNOSIS:  Suspected lumbar wound infection  POST-OPERATIVE DIAGNOSIS:  Symptomatic pseudomeningocele  PROCEDURE:  Lumbar exploration for repair of pseudomeningocele requiring laminectomy with secondary irrigation and debridement of wound  SURGEON:  Marikay Alar, MD  ASSISTANTS: None  ANESTHESIA:   General  EBL: Less than 50 ml  Total I/O In: 1100 [I.V.:1100] Out: 50 [Blood:50]  BLOOD ADMINISTERED:none  DRAINS: None   SPECIMEN:  No Specimen  INDICATION FOR PROCEDURE: This patient had a lumbar fusion about 3 weeks ago. He presented with back pain and leg pain and a red indurated wound without leakage. He had some fevers or low-grade. He was started on empiric oral antibiotics. MRI showed a subcutaneous fluid collection. I recommended a lumbar exploration for suspected lumbar wound infection. Patient understood the risks, benefits, and alternatives and potential outcomes and wished to proceed.  Findings at surgery: Small dural tear near the axilla of the right L3 nerve root  PROCEDURE DETAILS: The patient was taken to the operating room and after induction of adequate generalized endotracheal anesthesia he was rolled into the prone position on the Wilson frame and all pressure points were padded. His lumbar region was cleaned and prepped with DuraPrep and draped in usual sterile fashion. His old incision was opened. There was immediate release of fluid but not exudate. Tissues were clean and glistening. I immediately suspected this is a pseudomeningocele. I opened the fascia and removed the Gelfoam from the dura. I indeed found a tiny dural opening near the axilla of the L3 nerve root on the right. I undercut the lamina to expose the tear. I was able to get a single 6-0 Prolene suture and that stopped the leak. I Valsalva the patient to make sure there was no leakage around the suture. I then irrigated with  saline solution containing bacitracin. I inspected the hardware and found it to be tight and well placed. I lined the dura opening with fibrin glue and Gelfoam. I closed the muscle and the fascia with 0 Vicryl. With a subcutaneous and subcuticular tissue with 20 and 3-0 Vicryl. Skin was closed with benzoin Steri-Strips. The drapes were removed and a sterile dressing was applied. Tissue was then awakened from general anesthesia and transferred to the recovery room in stable condition. At the end of the procedure all sponge needle and instrument counts are correct.  PLAN OF CARE: Admit to inpatient   PATIENT DISPOSITION:  PACU - hemodynamically stable.   Delay start of Pharmacological VTE agent (>24hrs) due to surgical blood loss or risk of bleeding:  yes

## 2012-06-16 NOTE — Anesthesia Procedure Notes (Signed)
Procedure Name: Intubation Date/Time: 06/16/2012 5:01 PM Performed by: Coralee Rud Pre-anesthesia Checklist: Emergency Drugs available, Patient identified, Suction available, Patient being monitored and Timeout performed Patient Re-evaluated:Patient Re-evaluated prior to inductionOxygen Delivery Method: Circle system utilized Preoxygenation: Pre-oxygenation with 100% oxygen Intubation Type: IV induction Ventilation: Mask ventilation without difficulty Laryngoscope Size: Miller and 3 Grade View: Grade I Tube type: Oral Tube size: 7.5 mm Number of attempts: 1 Airway Equipment and Method: Stylet and LTA kit utilized Placement Confirmation: ETT inserted through vocal cords under direct vision,  positive ETCO2 and breath sounds checked- equal and bilateral Secured at: 22 cm Tube secured with: Tape Dental Injury: Teeth and Oropharynx as per pre-operative assessment

## 2012-06-16 NOTE — H&P (Signed)
Subjective: Patient is a 79 y.o. male admitted for I and D lumbar wound. Onset of symptoms was a few days ago, gradually worsening since that time.  The pain is rated severe, and is located at the across the lower back and radiates to legs. The pain is described as aching and occurs intermittently. The symptoms have been progressive. Symptoms are exacerbated by exercise. MRI or CT showed Fluid collection. Wound red but not draining. On oral antibiotics.   Past Medical History  Diagnosis Date  . GERD (gastroesophageal reflux disease)   . Anxiety   . Hypercholesterolemia   . Depression     when he retired.....  Marland Kitchen Arthritis   . Osteoporosis     /OSTEOPENIA  . OCD (obsessive compulsive disorder)     "MILD CASE"  . Bladder mass   . Spinal stenosis of lumbar region   . Chronic back pain   . H/O hiatal hernia   . History of retinal detachment PARTIAL DETACH-- NO SURGICAL INTERVENTION--  RESOLVED    RIGHT EYE 2008/   LEFT 2012  . Renal calculus, left   . Wound infection after surgery 06/16/2012    Lumbar wound infection post surgery in April ( 05/28/12)    Past Surgical History  Procedure Laterality Date  . Tonsillectomy    . Total knee arthroplasty  12/18/2010    Procedure: TOTAL KNEE ARTHROPLASTY;  Surgeon: Nilda Simmer, MD;  Location: [facility] OR;  Service: Orthopedics;  Laterality: Left;  ARTHROPLASTY KNEE TOTAL LEFT SIDE  . Colonoscopy    . Lumbar laminectomy/decompression microdiscectomy  08/29/2011    Procedure: LUMBAR LAMINECTOMY/DECOMPRESSION MICRODISCECTOMY 2 LEVELS;  Surgeon: Tia Alert, MD;  Location: [facility] OR[facility];  Service: Neurosurgery;  Laterality: N/A;  lumbar three-five lumbar laminectomy   . Inguinal hernia repair Left   . Transurethral resection of bladder tumor N/A 05/06/2012    Procedure: TRANSURETHRAL RESECTION OF BLADDER TUMOR (TURBT) WITH INSTILATION OF MYTOMIACIN C;  Surgeon: Antony Haste, MD;  Location: [facility];  Service: Urology;   Laterality: N/A;  . Eye surgery      Prior to Admission medications   Medication Sig Start Date End Date Taking? Authorizing Provider  alendronate (FOSAMAX) 70 MG tablet Take 70 mg by mouth every 7 (seven) days. Take with a full glass of water on an empty stomach. Take on Fridays   Yes Historical Provider, MD  aspirin EC 81 MG tablet Take 81 mg by mouth daily with supper.   Yes Historical Provider, MD  b complex vitamins tablet Take 1 tablet by mouth daily with lunch.   Yes Historical Provider, MD  Calcium Carbonate-Vitamin D (CALTRATE 600+D PO) Take 1 tablet by mouth 2 (two) times daily. At lunch and dinner   Yes Historical Provider, MD  Coenzyme Q10 (COQ10) 200 MG CAPS Take 200 mg by mouth daily with lunch.   Yes Historical Provider, MD  doxycycline (MONODOX) 100 MG capsule Take 100 mg by mouth 2 (two) times daily.   Yes Historical Provider, MD  ibuprofen (ADVIL,MOTRIN) 200 MG tablet Take 600 mg by mouth every 6 (six) hours as needed for pain (takes 3 (200mg ) tablets prn pain).   Yes Historical Provider, MD  methocarbamol (ROBAXIN) 500 MG tablet Take 1 tablet (500 mg total) by mouth every 6 (six) hours as needed. 05/29/12  Yes Tia Alert, MD  Multiple Vitamin (MULTIVITAMIN WITH MINERALS) TABS Take 1 tablet by mouth daily with lunch. Centrum   Yes Historical Provider, MD  omeprazole (PRILOSEC) 40 MG capsule Take 40 mg by mouth daily.    Yes Katy Apo, MD  oxyCODONE 10 MG TABS Take 1 tablet (10 mg total) by mouth every 6 (six) hours as needed. 05/29/12  Yes Tia Alert, MD  PARoxetine (PAXIL) 40 MG tablet Take 40 mg by mouth daily.    Yes Katy Apo, MD  Polyethyl Glycol-Propyl Glycol (SYSTANE OP) Apply 1 drop to eye daily as needed (for dry eyes).    Yes Historical Provider, MD  simvastatin (ZOCOR) 20 MG tablet Take 20 mg by mouth daily with supper.    Yes Katy Apo, MD  sulfamethoxazole-trimethoprim (BACTRIM DS) 800-160 MG per tablet Take 1 tablet by mouth 2 (two) times daily.    Yes Historical Provider, MD   Allergies  Allergen Reactions  . Prozac (Fluoxetine Hcl) Other (See Comments)    "Jittery"  . Vicodin (Hydrocodone-Acetaminophen) Other (See Comments)    hallucinations    History  Substance Use Topics  . Smoking status: Former Smoker -- 1.00 packs/day for 8 years    Types: Pipe    Quit date: 12/05/1986  . Smokeless tobacco: Former Neurosurgeon    Quit date: 07/12/1986  . Alcohol Use: 0.0 oz/week    1-2 Glasses of wine per week     Comment: per day    Family History  Problem Relation Age of Onset  . Heart failure Father   . Heart attack Brother      Review of Systems  Positive ROS: neg  All other systems have been reviewed and were otherwise negative with the exception of those mentioned in the HPI and as above.  Objective: Vital signs in last 24 hours: Temp:  [98.2 F (36.8 C)] 98.2 F (36.8 C) (05/19 1515) Pulse Rate:  [98] 98 (05/19 1515) Resp:  [1] 1 (05/19 1515) BP: (135)/(85) 135/85 mmHg (05/19 1515) SpO2:  [97 %] 97 % (05/19 1515) Weight:  [85.5 kg (188 lb 7.9 oz)] 85.5 kg (188 lb 7.9 oz) (05/19 1515)  General Appearance: Alert, cooperative, no distress, appears stated age Head: Normocephalic, without obvious abnormality, atraumatic Eyes: PERRL, conjunctiva/corneas clear, EOM's intact     Neck: Supple, symmetrical, trachea midlin2 Back: Symmetric, incision red and indurated Lungs: respirations unlabored Heart: Regular rate and rhythm Abdomen: Soft, non-tender Extremities: Extremities normal, atraumatic, no cyanosis or edema Pulses: 2+ and symmetric all extremities Skin: Skin color, texture, turgor normal, no rashes or lesions  NEUROLOGIC:   Mental status: Alert and oriented x4,  no aphasia, good attention span, fund of knowledge, and memory Motor Exam - grossly normal Sensory Exam - grossly normal Reflexes: 1+ Coordination - grossly normal Gait - grossly normal Balance - grossly normal Cranial Nerves: I: smell Not tested   II: visual acuity  OS: nl    OD: nl  II: visual fields Full to confrontation  II: pupils Equal, round, reactive to light  III,VII: ptosis None  III,IV,VI: extraocular muscles  Full ROM  V: mastication Normal  V: facial light touch sensation  Normal  V,VII: corneal reflex  Present  VII: facial muscle function - upper  Normal  VII: facial muscle function - lower Normal  VIII: hearing Not tested  IX: soft palate elevation  Normal  IX,X: gag reflex Present  XI: trapezius strength  5/5  XI: sternocleidomastoid strength 5/5  XI: neck flexion strength  5/5  XII: tongue strength  Normal    Data Review Lab Results  Component Value Date  WBC 6.7 06/16/2012   HGB 12.3* 06/16/2012   HCT 36.6* 06/16/2012   MCV 91.3 06/16/2012   PLT 408* 06/16/2012   Lab Results  Component Value Date   NA 137 06/16/2012   K 4.0 06/16/2012   CL 102 06/16/2012   CO2 25 06/16/2012   BUN 16 06/16/2012   CREATININE 1.06 06/16/2012   GLUCOSE 89 06/16/2012   Lab Results  Component Value Date   INR 1.02 05/21/2012    Assessment/Plan: Patient admitted for I and d lumbar wound. Patient has failed conservative therapy.  I explained the condition and procedure to the patient and answered any questions.  Patient wishes to proceed with procedure as planned. Understands risks/ benefits and typical outcomes of procedure.   Diezel Mazur S 06/16/2012 4:32 PM

## 2012-06-16 NOTE — Anesthesia Postprocedure Evaluation (Signed)
  Anesthesia Post-op Note  Patient: Timothy Osborn  Procedure(s) Performed: Procedure(s) with comments: LUMBAR WOUND DEBRIDEMENT (N/A) - Lumbar Wound Irrigation and debridment  Patient Location: PACU  Anesthesia Type:General  Level of Consciousness: awake, alert , oriented and patient cooperative  Airway and Oxygen Therapy: Patient Spontanous Breathing and Patient connected to nasal cannula oxygen  Post-op Pain: mild  Post-op Assessment: Post-op Vital signs reviewed, Patient's Cardiovascular Status Stable, Respiratory Function Stable, Patent Airway, No signs of Nausea or vomiting and Pain level controlled  Post-op Vital Signs: Reviewed and stable  Complications: No apparent anesthesia complications

## 2012-06-16 NOTE — Progress Notes (Signed)
ANTIBIOTIC CONSULT NOTE - INITIAL  Pharmacy Consult for Vancomycin Indication: Lumbar Wound Infection  Allergies  Allergen Reactions  . Prozac (Fluoxetine Hcl) Other (See Comments)    "Jittery"  . Vicodin (Hydrocodone-Acetaminophen) Other (See Comments)    hallucinations    Patient Measurements: Height: 6\' 2"  (188 cm) Weight: 197 lb 5 oz (89.5 kg) IBW/kg (Calculated) : 82.2  Vital Signs: Temp: 97.9 F (36.6 C) (05/19 2130) Temp src: Oral (05/19 2130) BP: 157/86 mmHg (05/19 2130) Pulse Rate: 97 (05/19 2130) Intake/Output from previous day:   Intake/Output from this shift:    Labs:  Recent Labs  06/16/12 1510  WBC 6.7  HGB 12.3*  PLT 408*  CREATININE 1.06   Estimated Creatinine Clearance: 63.5 ml/min (by C-G formula based on Cr of 1.06). No results found for this basename: VANCOTROUGH, Leodis Binet, VANCORANDOM, GENTTROUGH, GENTPEAK, GENTRANDOM, TOBRATROUGH, TOBRAPEAK, TOBRARND, AMIKACINPEAK, AMIKACINTROU, AMIKACIN,  in the last 72 hours   Microbiology: Recent Results (from the past 720 hour(s))  SURGICAL PCR SCREEN     Status: None   Collection Time    05/21/12 10:45 AM      Result Value Range Status   MRSA, PCR NEGATIVE  NEGATIVE Final   Staphylococcus aureus NEGATIVE  NEGATIVE Final   Comment:            The Xpert SA Assay (FDA     approved for NASAL specimens     in patients over 15 years of age),     is one component of     a comprehensive surveillance     program.  Test performance has     been validated by [employer] for patients greater     than or equal to 46 year old.     It is not intended     to diagnose infection nor to     guide or monitor treatment.  GRAM STAIN     Status: None   Collection Time    06/16/12  3:54 PM      Result Value Range Status   Specimen Description WOUND BACK   Final   Special Requests PATIENT ON FOLLOWING BACTRIM DOXYCYCLINE   Final   Gram Stain     Final   Value: NO ORGANISMS SEEN     FEW WBC PRESENT,BOTH PMN  AND MONONUCLEAR     Results Called toPollyann Kennedy [id number] WilderK   Report Status 06/16/2012 FINAL   Final    Medical History: Past Medical History  Diagnosis Date  . GERD (gastroesophageal reflux disease)   . Anxiety   . Hypercholesterolemia   . Depression     when he retired.....  Marland Kitchen Arthritis   . Osteoporosis     /OSTEOPENIA  . OCD (obsessive compulsive disorder)     "MILD CASE"  . Bladder mass   . Spinal stenosis of lumbar region   . Chronic back pain   . H/O hiatal hernia   . History of retinal detachment PARTIAL DETACH-- NO SURGICAL INTERVENTION--  RESOLVED    RIGHT EYE 2008/   LEFT 2012  . Renal calculus, left   . Wound infection after surgery 06/16/2012    Lumbar wound infection post surgery in April ( 05/28/12)    Medications:  Anti-infectives   Start     Dose/Rate Route Frequency Ordered Stop   06/16/12 1734  bacitracin 50,000 Units in sodium chloride irrigation 0.9 % 500 mL irrigation  Status:  Discontinued  As needed 06/16/12 1734 06/16/12 1822   06/16/12 1700  cefTRIAXone (ROCEPHIN) 1 g in dextrose 5 % 50 mL IVPB  Status:  Discontinued     1 g 100 mL/hr over 30 Minutes Intravenous To Neuro OR-Station #32 06/16/12 1649 06/16/12 2105   06/16/12 1648  vancomycin (VANCOCIN) 1 GM/200ML IVPB    Comments:  FLORES, BOB: cabinet override      06/16/12 1648 06/17/12 0459   06/16/12 1644  bacitracin 16109 UNITS injection    Comments:  Reece Packer: cabinet override      06/16/12 1644 06/17/12 0444     Assessment: 79 year old male s/p spinal surgery in April 2014 now with a lumbar wound infection.  He did not respond to oral antibiotics (Bactrim + Doxy) and is now s/p I&D today.  He is to begin antibiotic therapy with Vancomycin.  Goal of Therapy:  Vancomycin trough level 15-20 mcg/ml  Plan:  Vancomycin 750mg  IV q12h - 1st dose 5/20 at 0600 Check Vancomycin trough at steady state Monitor renal function  Follow-up culture data  Estella Husk,  Pharm.D., BCPS Clinical Pharmacist Phone: [phone] or [phone] Pager: [phone] 06/16/2012, 10:15 PM

## 2012-06-16 NOTE — Progress Notes (Signed)
Dr. Yetta Barre paged to request orders for patient.

## 2012-06-16 NOTE — Progress Notes (Signed)
PHARMACIST - PHYSICIAN COMMUNICATION  CONCERNING: P&T Medication Policy Regarding Oral Bisphosphonates  RECOMMENDATION: Your order for alendronate (Fosamax)has been discontinued at this time.  If the patient's post-hospital medical condition warrants safe use of this class of drugs, please resume the pre-hospital regimen upon discharge.  DESCRIPTION:  Alendronate (Fosamax), ibandronate (Boniva), and risedronate (Actonel) can cause severe esophageal erosions in patients who are unable to remain upright at least 30 minutes after taking this medication.   Since brief interruptions in therapy are thought to have minimal impact on bone mineral density, the Pharmacy & Therapeutics Committee has established that bisphosphonate orders should be routinely discontinued during hospitalization.   To override this safety policy and permit administration of Boniva, Fosamax, or Actonel in the hospital, prescribers must write "DO NOT HOLD" in the comments section when placing the order for this class of medications.  Thanks, Wendie Simmer, PharmD, BCPS Clinical Pharmacist  Pager: [phone]

## 2012-06-16 NOTE — Transfer of Care (Signed)
Immediate Anesthesia Transfer of Care Note  Patient: Timothy Osborn  Procedure(s) Performed: Procedure(s) with comments: LUMBAR WOUND DEBRIDEMENT (N/A) - Lumbar Wound Irrigation and debridment  Patient Location: PACU  Anesthesia Type:General  Level of Consciousness: awake and sedated  Airway & Oxygen Therapy: Patient Spontanous Breathing and Patient connected to face mask oxygen  Post-op Assessment: Report given to PACU RN, Post -op Vital signs reviewed and stable, Patient moving all extremities and Patient moving all extremities X 4  Post vital signs: Reviewed and stable  Complications: No apparent anesthesia complications

## 2012-06-16 NOTE — Progress Notes (Signed)
Report given to Erling Conte RN

## 2012-06-17 ENCOUNTER — Inpatient Hospital Stay (HOSPITAL_COMMUNITY): Payer: Medicare Other

## 2012-06-17 ENCOUNTER — Encounter (HOSPITAL_COMMUNITY): Payer: Self-pay | Admitting: General Practice

## 2012-06-17 LAB — CBC
HCT: 36.5 % — ABNORMAL LOW (ref 39.0–52.0)
Hemoglobin: 12.8 g/dL — ABNORMAL LOW (ref 13.0–17.0)
MCHC: 35.1 g/dL (ref 30.0–36.0)
WBC: 9.2 10*3/uL (ref 4.0–10.5)

## 2012-06-17 LAB — URINALYSIS, ROUTINE W REFLEX MICROSCOPIC
Glucose, UA: NEGATIVE mg/dL
Ketones, ur: 15 mg/dL — AB
Leukocytes, UA: NEGATIVE
Nitrite: NEGATIVE
Protein, ur: NEGATIVE mg/dL
pH: 6 (ref 5.0–8.0)

## 2012-06-17 LAB — BASIC METABOLIC PANEL
BUN: 13 mg/dL (ref 6–23)
CO2: 20 mEq/L (ref 19–32)
Calcium: 8.2 mg/dL — ABNORMAL LOW (ref 8.4–10.5)
Chloride: 99 mEq/L (ref 96–112)
Creatinine, Ser: 0.73 mg/dL (ref 0.50–1.35)
GFR calc Af Amer: >90 mL/min (ref >90)
GFR calc non Af Amer: 85 mL/min — ABNORMAL LOW (ref >90)
Glucose, Bld: 139 mg/dL — ABNORMAL HIGH (ref 70–99)
Potassium: 4 mEq/L (ref 3.5–5.1)
Sodium: 133 mEq/L — ABNORMAL LOW (ref 135–145)

## 2012-06-17 LAB — SEDIMENTATION RATE: Sed Rate: 42 mm/hr — ABNORMAL HIGH (ref 0–16)

## 2012-06-17 LAB — C-REACTIVE PROTEIN: CRP: 3.8 mg/dL — ABNORMAL HIGH (ref <0.60)

## 2012-06-17 MED ORDER — NALOXONE HCL 0.4 MG/ML IJ SOLN
0.4000 mg | INTRAMUSCULAR | Status: DC | PRN
  Administered 2012-06-17 (×2): 0.4 mg via INTRAVENOUS
  Filled 2012-06-17 (×2): qty 1

## 2012-06-17 MED ORDER — DEXTROSE 5 % IV SOLN
2.0000 g | Freq: Two times a day (BID) | INTRAVENOUS | Status: DC
  Administered 2012-06-17 – 2012-06-19 (×6): 2 g via INTRAVENOUS
  Filled 2012-06-17 (×10): qty 2

## 2012-06-17 NOTE — Progress Notes (Addendum)
Pt very lethargic this am with incomprehensible speech and unable to answer LOC questions.  Dr. Wynetta Emery notified, 0.4 mg Narcan was administered X2, pt is able to answer some LOC questions and speech is more clear, although still groggy.  2L O2 also administered, respirations 20. Pt also unable to void at 0445, 441 cc urine bladder scanned, pt I&O cath with 500 cc return, pt now DTV by 1100.  Will continue to monitor.

## 2012-06-17 NOTE — Progress Notes (Signed)
Patient ID: Timothy Osborn, male   DOB: 1928-10-13, 79 y.o.   MRN: [id number] Subjective: Patient disoriented today, agitated.  Objective: Vital signs in last 24 hours: Temp:  [97.5 F (36.4 C)-98.8 F (37.1 C)] 98.6 F (37 C) (05/20 0539) Pulse Rate:  [67-101] 101 (05/20 0539) Resp:  [1-24] 20 (05/20 0539) BP: (135-177)/(72-93) 171/86 mmHg (05/20 0539) SpO2:  [95 %-100 %] 99 % (05/20 0539) Weight:  [85.5 kg (188 lb 7.9 oz)-89.5 kg (197 lb 5 oz)] 89.5 kg (197 lb 5 oz) (05/19 2130)  Intake/Output from previous day: 05/19 0701 - 05/20 0700 In: 1100 [I.V.:1100] Out: 50 [Blood:50] Intake/Output this shift:    states name but not age or place, moves all extremities, neck stiff (age? meningeal irritation?)  Lab Results: Lab Results  Component Value Date   WBC 6.7 06/16/2012   HGB 12.3* 06/16/2012   HCT 36.6* 06/16/2012   MCV 91.3 06/16/2012   PLT 408* 06/16/2012   Lab Results  Component Value Date   INR 1.02 05/21/2012   BMET Lab Results  Component Value Date   NA 137 06/16/2012   K 4.0 06/16/2012   CL 102 06/16/2012   CO2 25 06/16/2012   GLUCOSE 89 06/16/2012   BUN 16 06/16/2012   CREATININE 1.06 06/16/2012   CALCIUM 8.8 06/16/2012    Studies/Results: Mr Lumbar Spine W Contrast  06/16/2012   *RADIOLOGY REPORT*  Clinical Data: Persistent low back pain status post surgery in August 2013 and 05/28/2012.  History of bladder cancer with radiation therapy.  Evaluate for postoperative infection.  MRI LUMBAR SPINE WITH CONTRAST  Technique:  Multiplanar and multiecho pulse sequences of the lumbar spine were obtained with intravenous contrast. As requested, this is a limited study consisting of T1-weighted images pre and post contrast.  No T2-weighted imaging was obtained.  Contrast: 19mL MULTIHANCE GADOBENATE DIMEGLUMINE 529 MG/ML IV SOLN  Comparison: Office radiographs 06/09/2012, abdominal pelvic CT 04/23/2012 and lumbar MRI 03/05/2012.  Findings: The patient has undergone laminectomy and  PLIF from L3- L5.  The hardware appears stable in position compared with the postoperative radiographs.  The lumbar alignment is normal.  There is mild nonspecific decreased T1 signal and enhancement within the endplates at the operative levels.  No cortical destruction is identified.  There is a peripheral enhancing fluid collection within the subcutaneous fat at the operative levels.  This measures approximately 7.1 x 3.7 x 3.6 cm. There is also a small amount of fluid within the laminectomy bed, best seen on the postcontrast images.  No epidural fluid collection or abnormal intradural enhancement is identified.  No significant disc space findings are demonstrated from T11-T12 through L2-L3.  L3-L4:  Postsurgical changes status post laminectomy and PLIF as described above. There is epidural enhancement surrounding the thecal sac.  No significant residual spinal stenosis identified. No residual synovial cyst is identified on T1-weighted imaging.  L4-L5:  Postsurgical changes status post laminectomy and PLIF as described above.  No significant residual spinal stenosis identified.  No residual synovial cyst is identified on T1-weighted imaging.  L5-S1:  Disc height is maintained.  There is stable mild bilateral facet hypertrophy.  No spinal stenosis or nerve root encroachment.  IMPRESSION:  1.  Decompression of the spinal canal status post laminectomy and PLIF from L3-L5. 2.  Nonspecific fluid collections within the laminectomy bed and overlying subcutaneous fat. 3.  Nonspecific endplate enhancement at the operative levels.  No cortical destruction or specific signs of infection identified on T1-weighted imaging.  Original Report Authenticated By: Carey Bullocks, M.D.    Assessment/Plan: Pt with post-op delirium. Will get CT head, but i suspect his headache is from CSF loss and low pressure. This is also likely the cause of the delirium, however meningitis as a possibility. We'll check some labs as well. Hold  narcotics. Broaden antibiotic coverage.    LOS: 1 day    Timothy Osborn S 06/17/2012, 7:48 AM

## 2012-06-17 NOTE — Progress Notes (Signed)
Rapid response called, report given to RR RN, they are coming to observe the pt now.

## 2012-06-18 LAB — WOUND CULTURE

## 2012-06-18 LAB — BASIC METABOLIC PANEL
Calcium: 8.2 mg/dL — ABNORMAL LOW (ref 8.4–10.5)
Creatinine, Ser: 0.46 mg/dL — ABNORMAL LOW (ref 0.50–1.35)
GFR calc Af Amer: >90 mL/min (ref >90)
Sodium: 130 mEq/L — ABNORMAL LOW (ref 135–145)

## 2012-06-18 MED ORDER — ALUM & MAG HYDROXIDE-SIMETH 200-200-20 MG/5ML PO SUSP
30.0000 mL | ORAL | Status: DC | PRN
  Administered 2012-06-18 (×2): 30 mL via ORAL
  Filled 2012-06-18 (×2): qty 30

## 2012-06-18 MED ORDER — VANCOMYCIN HCL 10 G IV SOLR
1250.0000 mg | Freq: Two times a day (BID) | INTRAVENOUS | Status: DC
  Administered 2012-06-19 – 2012-06-20 (×3): 1250 mg via INTRAVENOUS
  Filled 2012-06-18 (×4): qty 1250

## 2012-06-18 NOTE — Progress Notes (Signed)
Patient ID: Timothy Osborn, male   DOB: [birth year], 79 y.o.   MRN: [id number] Much much better. No real headache. No leg pain. Dressing dry. No delirium or confusion. Mobilize today. D/c foley.

## 2012-06-18 NOTE — Progress Notes (Signed)
PT Cancellation Note  Patient Details Name: RIELLY CORLETT MRN: [id number] DOB: 1928/10/02   Cancelled Treatment:    Reason Eval/Treat Not Completed: Other (comment) (brace not present when order for brace)   Olivia Canter 06/18/2012, 3:52 PM

## 2012-06-18 NOTE — Progress Notes (Signed)
Patient complaining of heartburn and gas unrelieved by protonix.  Dr. Yetta Barre at bedside and notified of complaint; new order for maalox PRN.  Will continue to monitor.

## 2012-06-18 NOTE — Progress Notes (Signed)
ANTIBIOTIC CONSULT NOTE - Follow up  Pharmacy Consult for Vancomycin Indication: Lumbar Wound Infection  Allergies  Allergen Reactions  . Prozac (Fluoxetine Hcl) Other (See Comments)    "Jittery"  . Vicodin (Hydrocodone-Acetaminophen) Other (See Comments)    hallucinations    Patient Measurements: Height: 6\' 2"  (188 cm) Weight: 197 lb 5 oz (89.5 kg) IBW/kg (Calculated) : 82.2  Vital Signs: Temp: 98.1 F (36.7 C) (05/21 1800) Temp src: Oral (05/21 1800) BP: 150/77 mmHg (05/21 1800) Pulse Rate: 75 (05/21 1800) Intake/Output from previous day: 05/20 0701 - 05/21 0700 In: -  Out: 920 [Urine:920] Intake/Output from this shift:    Labs:  Recent Labs  06/16/12 1510 06/17/12 0848 06/18/12 0520  WBC 6.7 9.2  --   HGB 12.3* 12.8*  --   PLT 408* 392  --   CREATININE 1.06 0.73 0.46*   Estimated Creatinine Clearance: 84.2 ml/min (by C-G formula based on Cr of 0.46).  Recent Labs  06/18/12 1646  VANCOTROUGH 6.1*     Microbiology: Recent Results (from the past 720 hour(s))  SURGICAL PCR SCREEN     Status: None   Collection Time    05/21/12 10:45 AM      Result Value Range Status   MRSA, PCR NEGATIVE  NEGATIVE Final   Staphylococcus aureus NEGATIVE  NEGATIVE Final   Comment:            The Xpert SA Assay (FDA     approved for NASAL specimens     in patients over 23 years of age),     is one component of     a comprehensive surveillance     program.  Test performance has     been validated by [employer] for patients greater     than or equal to 30 year old.     It is not intended     to diagnose infection nor to     guide or monitor treatment.  ANAEROBIC CULTURE     Status: None   Collection Time    06/16/12  3:54 PM      Result Value Range Status   Specimen Description WOUND BACK   Final   Special Requests PATIENT ON FOLLOWING BACTRIM DOXYCYCLINE   Final   Gram Stain PENDING   Incomplete   Culture     Final   Value: NO ANAEROBES ISOLATED; CULTURE  IN PROGRESS FOR 5 DAYS   Report Status PENDING   Incomplete  WOUND CULTURE     Status: None   Collection Time    06/16/12  3:54 PM      Result Value Range Status   Specimen Description WOUND BACK   Final   Special Requests PATIENT ON FOLLOWING BACTRIM DOXYCYLINE   Final   Gram Stain     Final   Value: FEW WBC PRESENT,BOTH PMN AND MONONUCLEAR     NO ORGANISMS SEEN     Performed at [facility]   Culture NO GROWTH 2 DAYS   Final   Report Status 06/18/2012 FINAL   Final  GRAM STAIN     Status: None   Collection Time    06/16/12  3:54 PM      Result Value Range Status   Specimen Description WOUND BACK   Final   Special Requests PATIENT ON FOLLOWING BACTRIM DOXYCYCLINE   Final   Gram Stain     Final   Value: NO ORGANISMS SEEN  FEW WBC PRESENT,BOTH PMN AND MONONUCLEAR     Results Called to: Pollyann Kennedy [id number] WilderK   Report Status 06/16/2012 FINAL   Final    Medical History: Past Medical History  Diagnosis Date  . GERD (gastroesophageal reflux disease)   . Anxiety   . Hypercholesterolemia   . Depression     when he retired.....  Marland Kitchen Arthritis   . Osteoporosis     /OSTEOPENIA  . OCD (obsessive compulsive disorder)     "MILD CASE"  . Bladder mass   . Spinal stenosis of lumbar region   . Chronic back pain   . H/O hiatal hernia   . History of retinal detachment PARTIAL DETACH-- NO SURGICAL INTERVENTION--  RESOLVED    RIGHT EYE 2008/   LEFT 2012  . Renal calculus, left   . Wound infection after surgery 06/16/2012    Lumbar wound infection post surgery in April ( 05/28/12)    Medications:  Anti-infectives   Start     Dose/Rate Route Frequency Ordered Stop   06/17/12 0830  cefTRIAXone (ROCEPHIN) 2 g in dextrose 5 % 50 mL IVPB     2 g 100 mL/hr over 30 Minutes Intravenous Every 12 hours 06/17/12 0759     06/17/12 0600  vancomycin (VANCOCIN) IVPB 750 mg/150 ml premix     750 mg 150 mL/hr over 60 Minutes Intravenous Every 12 hours 06/16/12 2216     06/16/12 1734   bacitracin 50,000 Units in sodium chloride irrigation 0.9 % 500 mL irrigation  Status:  Discontinued       As needed 06/16/12 1734 06/16/12 1822   06/16/12 1700  cefTRIAXone (ROCEPHIN) 1 g in dextrose 5 % 50 mL IVPB  Status:  Discontinued     1 g 100 mL/hr over 30 Minutes Intravenous To Neuro OR-Station #32 06/16/12 1649 06/16/12 2105   06/16/12 1644  bacitracin 04540 UNITS injection    Comments:  Reece Packer: cabinet override      06/16/12 1644 06/17/12 0444     Assessment: 79 year old male s/p spinal surgery in April 2014 now with a lumbar wound infection.  He did not respond to oral antibiotics (Bactrim + Doxy) and is now s/p I&D. Receiving day #2 vancomycin and rocephin, obtained trough today below goal, will increase dose.   Goal of Therapy:  Vanc trough 10-15  Plan:  Change vancomycin to 1250mg  IV q12h and continue to follow.  Verlene Mayer, PharmD, BCPS Pager [phone]  06/18/2012, 7:33 PM

## 2012-06-18 NOTE — Progress Notes (Signed)
Contacted Dr. Yetta Barre regarding patient's status on droplet precautions; new order to discontinue precautions.  Will continued to monitor.

## 2012-06-19 NOTE — Progress Notes (Signed)
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 Pt stated that in previous surgeries he pushes it too fast, and so he is going to take his time recovering this time. Pt performed ADL at sink level at supervision level. Pt needed mod cueing for back precautions and brace precautions.  Pt recieved home education to maintain precautions. Pt would benefit from OT care while in hospital to reinforce precautions and safety for better transition home. Pt also stated at end of session "I didn't realize I was this weak" Pt's caregiver/wife present for session and recieved education as well.    OT Assessment  Patient needs continued OT Services    Follow Up Recommendations  Home health OT    Barriers to Discharge None Pt has wife and family/friends available for 24 hr care.  Equipment Recommendations  Other (comment) (Pt has all AE and DME from previous surgeries)       Frequency  Min 2X/week    Precautions / Restrictions Precautions Precautions: Back Precaution Booklet Issued: Yes (comment) Precaution Comments: Pt not able to recall back precautions Required Braces or Orthoses: Spinal Brace Spinal Brace: Lumbar corset;Applied in sitting position Restrictions Weight Bearing Restrictions: No   Pertinent Vitals/Pain Pt reported pain is 2 or 3 out of 10. Just discomfort.    ADL  Grooming: Performed;Shaving Where Assessed - Grooming: Unsupported standing;Other (comment) (using left UE on sink surface) Toilet Transfer: Simulated;Supervision/safety (with mod cues to follow back precautions) Toilet  Transfer Method: Sit to stand Toilet Transfer Equipment: Raised toilet seat with arms (or 3-in-1 over toilet) Where Assessed - Toileting Clothing Manipulation and Hygiene: Other (comment) (bed) Equipment Used: Back brace;Cane;Gait belt Transfers/Ambulation Related to ADLs: Pt required mod cues to maintain back precautions, and body positioning. ADL Comments: Pt performed ADL at sink level standing using left hand to support at supervision level. Pt uses 3in1 over toilet for extra support for toileting at home.    OT Diagnosis: Generalized weakness  OT Problem List: Decreased strength;Decreased activity tolerance;Decreased safety awareness;Decreased knowledge of precautions OT Treatment Interventions: Self-care/ADL training;Energy conservation;Therapeutic activities;Patient/family education   OT Goals Acute Rehab OT Goals OT Goal Formulation: With patient/family Time For Goal Achievement: 07/03/12 Potential to Achieve Goals: Good ADL Goals Pt Will Perform Grooming: with modified independence;Standing at sink;Unsupported ADL Goal: Grooming - Progress: Goal set today Pt Will Perform Upper Body Dressing: with modified independence;Sitting, chair ADL Goal: Upper Body Dressing - Progress: Goal set today Pt Will Perform Lower Body Dressing: with modified independence;Sit to stand from chair;Sit to stand from bed;Unsupported ADL Goal: Lower Body Dressing - Progress: Goal set today Pt Will Transfer to Toilet: with modified independence;Comfort height toilet;3-in-1 ADL Goal: Toilet Transfer - Progress: Goal set today Pt Will Perform Toileting - Clothing Manipulation: with modified independence;Standing ADL Goal: Toileting - Clothing Manipulation - Progress: Goal set today Pt Will Perform Toileting - Hygiene: with modified independence;Sitting on 3-in-1 or toilet ADL Goal: Toileting - Hygiene - Progress: Goal set today  Visit Information  Last OT Received On: 06/19/12 Assistance Needed: +1     Subjective Data  Subjective: Pt not able to recall precautions. "I didn't realize I was this weak,  I'm tired from standing to shave" Patient Stated Goal: "I want to take my time and recover the right way"   Prior Functioning     Home Living Lives With: Spouse Available Help at Discharge: Family;Neighbor;Friend(s) Type of Home: House Home Access: Stairs to enter [employer] of Steps: 3 Entrance Stairs-Rails: Can reach both Home Layout: Two level Alternate Level Stairs-Rails: Can reach both Bathroom Shower/Tub: Health visitor: Handicapped height Home Adaptive Equipment: Straight cane;Walker - rolling;Bedside commode/3-in-1;Reacher;Long-handled shoehorn;Long-handled sponge;Sock aid Additional Comments: Pt has been living on 1st floor with walk in shower and raised toilet.  Prior Function Level of Independence: Independent Able to Take Stairs?: Reciprically Driving: Yes Vocation: Retired Musician: No difficulties Dominant Hand: Right         Vision/Perception Vision - History Baseline Vision: Wears glasses all the time   Cognition  Cognition Arousal/Alertness: Awake/alert Behavior During Therapy: Impulsive Overall Cognitive Status: Within Functional Limits for tasks assessed       Mobility Bed Mobility Bed Mobility: Rolling Right;Right Sidelying to Sit;Sitting - Scoot to [place] of Bed Rolling Right: 4: Min assist;Other (comment) (needed mod cues, Pt was not rolling at all until reminded) Right Sidelying to Sit: 4: Min assist;Other (comment) (Pt needed instructions for sequence of sitting and was weak) Supine to Sit: 4: Min assist;With rails;HOB elevated;Other (comment) (15 degree incline, supine<>sit used rails back down no rails) Sitting - Scoot to Edge of Bed: 4: Min guard Details for Bed Mobility Assistance: Pt needed mod cues and education for bed mobility to maintain back precautions. Transfers Transfers: Sit to  Stand;Stand to Sit Sit to Stand: 5: Supervision;From bed;With upper extremity assist Stand to Sit: 5: Supervision;To bed Details for Transfer Assistance: Pt educated that he needs to come to EOB (or surface he's sitting on) to maintain back precautions and prevent bending.           End of Session OT - End of Session Equipment Utilized During Treatment: Gait belt;Back brace;Other (comment) (Cane) Activity Tolerance: Patient limited by fatigue Patient left: in bed;with call bell/phone within reach;with family/visitor present Nurse Communication: Mobility status;Precautions  GO     Sherryl Manges 06/19/2012, 9:57 AM

## 2012-06-19 NOTE — Evaluation (Signed)
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 All back education and precautions reinforced once again.  Pt would benefit from PT  while in hospital to reinforce education and maximize Independence.    PT Assessment  Patient needs continued PT services    Follow Up Recommendations  No PT follow up    Does the patient have the potential to tolerate intense rehabilitation      Barriers to Discharge None      Equipment Recommendations  None recommended by PT    Recommendations for Other Services     Frequency Min 5X/week    Precautions / Restrictions Precautions Precautions: Back Precaution Booklet Issued: Yes (comment) Precaution Comments: Pt not able to recall back precautions Required Braces or Orthoses: Spinal Brace Spinal Brace: Lumbar corset;Applied in sitting position Restrictions Weight Bearing Restrictions: No   Pertinent Vitals/Pain       Mobility  Bed Mobility Bed Mobility: Right Sidelying to Sit Rolling Right: 4: Min guard Right Sidelying to Sit: 4: Min assist;HOB flat Sitting - Scoot to Edge of Bed: 4: Min guard Details for Bed Mobility Assistance: Pt needed mod cues and education for bed mobility to maintain back precautions. Transfers Transfers: Sit to Stand;Stand to Sit Sit to Stand: 5: Supervision;From bed;With upper extremity assist Stand to Sit: 5: Supervision;To chair/3-in-1 Details for Transfer Assistance: reinforced safe technique Ambulation/Gait Ambulation/Gait Assistance: 5: Supervision Ambulation Distance (Feet): 500 Feet Assistive device: Straight  cane Ambulation/Gait Assistance Details: generally steady, but quick to fatigue Gait Pattern: Step-through pattern    Exercises     PT Diagnosis: Acute pain  PT Problem List: Decreased strength;Decreased activity tolerance;Decreased mobility;Decreased knowledge of use of DME;Pain PT Treatment Interventions: Patient/family education;Gait training;Stair training;Functional mobility training   PT Goals Acute Rehab PT Goals Time For Goal Achievement: 06/26/12 Potential to Achieve Goals: Good Pt will Ambulate: >150 feet;Independently PT Goal: Ambulate - Progress: Goal set today Pt will Go Up / Down Stairs: Independently;Flight PT Goal: Up/Down Stairs - Progress: Goal set today  Visit Information  Last PT Received On: 06/19/12 Assistance Needed: +1    Subjective Data  Subjective: We'll be fine, we just have a lot going on and my family thought it was touch and go there for awhile Patient Stated Goal: Go home    Prior Functioning  Home Living Lives With: Spouse Available Help at Discharge: Family;Neighbor;Friend(s) Type of Home: House Home Access: Stairs to enter [employer] of Steps: 3 Entrance Stairs-Rails: Can reach both Home Layout: Two level Alternate Level Stairs-Rails: Can reach both Bathroom Shower/Tub: Health visitor: Handicapped height Home Adaptive Equipment: Straight cane;Walker - rolling;Bedside commode/3-in-1;Reacher;Long-handled shoehorn;Long-handled sponge;Sock aid Additional Comments: Pt has been living on 1st floor with walk in shower and raised toilet.  Prior Function Level of Independence: Independent Able to Take Stairs?: Reciprically Driving: Yes Vocation: Retired Musician: No difficulties Dominant Hand: Right    Cognition  Cognition Arousal/Alertness: Awake/alert Behavior During Therapy: WFL for tasks assessed/performed Overall Cognitive Status: Within Functional Limits for tasks assessed     Extremity/Trunk Assessment Right Upper Extremity Assessment RUE ROM/Strength/Tone: [facility] for tasks assessed Left Upper Extremity Assessment LUE ROM/Strength/Tone: [facility] for tasks  assessed Right Lower Extremity Assessment RLE ROM/Strength/Tone: [facility] for tasks assessed Left Lower Extremity Assessment LLE ROM/Strength/Tone: [facility] for tasks assessed Trunk Assessment Trunk Assessment: Normal   Balance    End of Session PT - End of Session Equipment Utilized During Treatment: Back brace Activity Tolerance: Patient tolerated treatment well Patient left: in chair;with call bell/phone within reach  GP     Timothy Osborn, Timothy Osborn 06/19/2012, 12:37 PM 06/19/2012  Timothy Osborn, PT [phone]  (pager)

## 2012-06-19 NOTE — Progress Notes (Signed)
Patient ID: Timothy Osborn, male   DOB: [birth year], 79 y.o.   MRN: [id number] Seems to be doing great. Very little pain. Incision CDI. Redness has really improved. Good strength. Home later today if does great with ambulation/ PT, or tomorrow if necessary.

## 2012-06-19 NOTE — Progress Notes (Signed)
I agree with the following treatment note after reviewing documentation.   Johnston, Lonie Rummell Brynn   OTR/L Pager: [phone] Office: [phone] .   

## 2012-06-20 MED ORDER — OXYCODONE HCL 10 MG PO TABS: 10.0000 mg | ORAL_TABLET | Freq: Four times a day (QID) | ORAL | Status: DC | PRN

## 2012-06-20 NOTE — Progress Notes (Signed)
I agree with the following treatment note after reviewing documentation.   Johnston, Emelio Schneller Brynn   OTR/L Pager: [phone] Office: [phone] .   

## 2012-06-20 NOTE — Progress Notes (Signed)
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 Pt demonstrates great improvement with recalling precautions, Pt recieved education on LB dressing techniques. Pt dresses UB with mod I, and needed min cues on bed mobility. Wife was present for session, and was very knowledgeable about precautions and don/doff brace.     Follow Up Recommendations  No OT follow up             Frequency Min 2X/week   Plan Discharge plan remains appropriate    Precautions / Restrictions Precautions Precautions: Back Precaution Comments: Pt able to recall 3/3 back precautions and 2/2 brace precautions Required Braces or Orthoses: Spinal Brace Spinal Brace: Lumbar corset;Applied in sitting position Restrictions Weight Bearing Restrictions: No   Pertinent Vitals/Pain Pt reported 1/10 pain    ADL  Upper Body Dressing: Modified independent;Performed Where Assessed - Upper Body Dressing: Unsupported sitting Lower Body Dressing: Performed;Minimal assistance Where Assessed - Lower Body Dressing: Unsupported sitting;Unsupported sit to stand ADL Comments: Pt maintained back precautions while dressing, but needed min A with LB dressing. Wife will be available at discharge and has helped with LB dressing with prior surgery       OT Goals Acute Rehab OT Goals OT Goal Formulation: With patient/family Time For Goal Achievement: 07/03/12 Potential to Achieve Goals: Good ADL Goals Pt Will Perform Grooming: with modified independence;Standing at sink;Unsupported Pt Will Perform Upper Body Dressing: with modified  independence;Sitting, chair ADL Goal: Upper Body Dressing - Progress: Met Pt Will Perform Lower Body Dressing: with modified independence;Sit to stand from chair;Sit to stand from bed;Unsupported ADL Goal: Lower Body Dressing - Progress: Progressing toward goals Pt Will Transfer to Toilet: with modified independence;Comfort height toilet;3-in-1 Pt Will Perform Toileting - Clothing Manipulation: with modified independence;Standing Pt Will Perform Toileting - Hygiene: with modified independence;Sitting on 3-in-1 or toilet  Visit Information  Last OT Received On: 06/20/12 Assistance Needed: +1          Cognition  Cognition Arousal/Alertness: Awake/alert Behavior During Therapy: WFL for tasks assessed/performed Overall Cognitive Status: Within Functional Limits for tasks assessed    Mobility  Bed Mobility Bed Mobility: Rolling Right;Right Sidelying to Sit Rolling Right: 5: Supervision Right Sidelying to Sit: 5: Supervision;HOB flat Sitting - Scoot to Edge of Bed: 5: Supervision Details for Bed Mobility Assistance: min vc for bed mobility to maintain back precautions Transfers Transfers: Sit to Stand;Stand to Sit Sit to Stand: 5: Supervision;From bed;With upper extremity assist Stand to Sit: To bed;5: Supervision;With upper extremity assist Details for Transfer Assistance: reinforced safe technique          End of Session OT - End of Session Equipment Utilized During Treatment: Back brace;Other (comment) (cane) Activity Tolerance: Patient tolerated treatment well Patient left: in bed;with call bell/phone within reach;with family/visitor present;with nursing in room Nurse Communication: Mobility status;Precautions  GO     Sherryl Manges 06/20/2012, 10:41 AM

## 2012-06-20 NOTE — Progress Notes (Signed)
Pt given discharge instructions, reviewed back precautions, given prescriptions.  Questions answered.  Incisiion clean dry and intact, no reddness or drainage at site. Yohanna Tow [employer] fundraiser. SCRN

## 2012-06-20 NOTE — Discharge Summary (Signed)
Physician Discharge Summary  Patient ID: Timothy Osborn MRN: [id number] DOB/AGE: 77-Nov-1930 77 y.o.  Admit date: 06/16/2012 Discharge date: 06/20/2012  Admission Diagnoses: Suspected lumbar wound infection    Discharge Diagnoses: Pseudomeningocele   Discharged Condition: good  Hospital Course: The patient was admitted on 06/16/2012 and taken to the operating room where the patient underwent irrigation and debridement of lumbar wound with repair of CSF leak. The patient tolerated the procedure well and was taken to the recovery room and then to the floor in stable condition. The hospital course was routine. There were no complications. The wound remained clean dry and intact. He remained on postoperative IV antibiotics. His cultures remained negative. Pt had appropriate back soreness but it was much better than preop. No complaints of leg pain or new N/T/W. The patient remained afebrile with stable vital signs, and tolerated a regular diet. The patient continued to increase activities, and pain was well controlled with oral pain medications.   Consults: None  Significant Diagnostic Studies:  Results for orders placed during the hospital encounter of 06/16/12  ANAEROBIC CULTURE      Result Value Range   Specimen Description WOUND BACK     Special Requests PATIENT ON FOLLOWING BACTRIM DOXYCYCLINE     Gram Stain PENDING     Culture       Value: NO ANAEROBES ISOLATED; CULTURE IN PROGRESS FOR 5 DAYS   Report Status PENDING    WOUND CULTURE      Result Value Range   Specimen Description WOUND BACK     Special Requests PATIENT ON FOLLOWING BACTRIM DOXYCYLINE     Gram Stain       Value: FEW WBC PRESENT,BOTH PMN AND MONONUCLEAR     NO ORGANISMS SEEN     Performed at [facility]   Culture NO GROWTH 2 DAYS     Report Status 06/18/2012 FINAL    GRAM STAIN      Result Value Range   Specimen Description WOUND BACK     Special Requests PATIENT ON FOLLOWING BACTRIM DOXYCYCLINE      Gram Stain       Value: NO ORGANISMS SEEN     FEW WBC PRESENT,BOTH PMN AND MONONUCLEAR     Results Called toPollyann Kennedy [id number] [address]   Report Status 06/16/2012 FINAL    BASIC METABOLIC PANEL      Result Value Range   Sodium 137  135 - 145 mEq/L   Potassium 4.0  3.5 - 5.1 mEq/L   Chloride 102  96 - 112 mEq/L   CO2 25  19 - 32 mEq/L   Glucose, Bld 89  70 - 99 mg/dL   BUN 16  6 - 23 mg/dL   Creatinine, Ser 9.14  0.50 - 1.35 mg/dL   Calcium 8.8  8.4 - 78.2 mg/dL   GFR calc non Af Amer 64 (*) >90 mL/min   GFR calc Af Amer 74 (*) >90 mL/min  CBC      Result Value Range   WBC 6.7  4.0 - 10.5 K/uL   RBC 4.01 (*) 4.22 - 5.81 MIL/uL   Hemoglobin 12.3 (*) 13.0 - 17.0 g/dL   HCT 95.6 (*) 21.3 - 08.6 %   MCV 91.3  78.0 - 100.0 fL   MCH 30.7  26.0 - 34.0 pg   MCHC 33.6  30.0 - 36.0 g/dL   RDW 57.8  46.9 - 62.9 %   Platelets 408 (*) 150 - 400 K/uL  CBC      Result Value Range   WBC 9.2  4.0 - 10.5 K/uL   RBC 4.10 (*) 4.22 - 5.81 MIL/uL   Hemoglobin 12.8 (*) 13.0 - 17.0 g/dL   HCT 40.9 (*) 81.1 - 91.4 %   MCV 89.0  78.0 - 100.0 fL   MCH 31.2  26.0 - 34.0 pg   MCHC 35.1  30.0 - 36.0 g/dL   RDW 78.2  95.6 - 21.3 %   Platelets 392  150 - 400 K/uL  BASIC METABOLIC PANEL      Result Value Range   Sodium 133 (*) 135 - 145 mEq/L   Potassium 4.0  3.5 - 5.1 mEq/L   Chloride 99  96 - 112 mEq/L   CO2 20  19 - 32 mEq/L   Glucose, Bld 139 (*) 70 - 99 mg/dL   BUN 13  6 - 23 mg/dL   Creatinine, Ser 0.86  0.50 - 1.35 mg/dL   Calcium 8.2 (*) 8.4 - 10.5 mg/dL   GFR calc non Af Amer 85 (*) >90 mL/min   GFR calc Af Amer >90  >90 mL/min  SEDIMENTATION RATE      Result Value Range   Sed Rate 42 (*) 0 - 16 mm/hr  C-REACTIVE PROTEIN      Result Value Range   CRP 3.8 (*) <0.60 mg/dL  URINALYSIS, ROUTINE W REFLEX MICROSCOPIC      Result Value Range   Color, Urine YELLOW  YELLOW   APPearance CLEAR  CLEAR   Specific Gravity, Urine 1.022  1.005 - 1.030   pH 6.0  5.0 - 8.0   Glucose, UA  NEGATIVE  NEGATIVE mg/dL   Hgb urine dipstick NEGATIVE  NEGATIVE   Bilirubin Urine NEGATIVE  NEGATIVE   Ketones, ur 15 (*) NEGATIVE mg/dL   Protein, ur NEGATIVE  NEGATIVE mg/dL   Urobilinogen, UA 0.2  0.0 - 1.0 mg/dL   Nitrite NEGATIVE  NEGATIVE   Leukocytes, UA NEGATIVE  NEGATIVE  BASIC METABOLIC PANEL      Result Value Range   Sodium 130 (*) 135 - 145 mEq/L   Potassium 3.6  3.5 - 5.1 mEq/L   Chloride 97  96 - 112 mEq/L   CO2 21  19 - 32 mEq/L   Glucose, Bld 109 (*) 70 - 99 mg/dL   BUN 14  6 - 23 mg/dL   Creatinine, Ser 5.78 (*) 0.50 - 1.35 mg/dL   Calcium 8.2 (*) 8.4 - 10.5 mg/dL   GFR calc non Af Amer >90  >90 mL/min   GFR calc Af Amer >90  >90 mL/min  VANCOMYCIN, TROUGH      Result Value Range   Vancomycin Tr 6.1 (*) 10.0 - 20.0 ug/mL    Chest 2 View  05/21/2012   *RADIOLOGY REPORT*  Clinical Data: Preop PLIF  CHEST - 2 VIEW  Comparison: 12/12/2010  Findings: Lungs are essentially clear.  No focal consolidation.  No pleural effusion or pneumothorax.  The heart is normal in size.  Mild degenerative changes of the visualized thoracolumbar spine.  IMPRESSION: No evidence of acute cardiopulmonary disease.   Original Report Authenticated By: Charline Bills, M.D.   Dg Lumbar Spine 2-3 Views  05/28/2012   *RADIOLOGY REPORT*  Clinical Data: L3-5 fusion.  LUMBAR SPINE - 2-3 VIEW, DG C-ARM 61-120 MIN  Comparison: Lumbar spine MRI 03/05/2012.  Findings: We are provided with two fluoroscopic intraoperative spot views of the lumbar spine.  Images demonstrate  pedicle  screws, stabilization bars and interbody spacers from L3-L5.  Laminectomy defect at the fusion levels also seen. Hardware appears intact.  No fracture is identified.  IMPRESSION: L3-5 PLIF   Original Report Authenticated By: Holley Dexter, M.D.   Ct Head Wo Contrast  06/17/2012   *RADIOLOGY REPORT*  Clinical Data: Confusion and headache.  Recent back surgery 05/28/2012  CT HEAD WITHOUT CONTRAST  Technique:  Contiguous axial  images were obtained from the base of the skull through the vertex without contrast.  Comparison: None.  Findings: Ventricle size is normal.  Brain volume is normal for age.  Negative for acute infarct.  Negative for hemorrhage or mass. No fluid collection is identified.  Calvarium is intact.  IMPRESSION: No acute abnormality.   Original Report Authenticated By: Janeece Riggers, M.D.   Mr Lumbar Spine W Contrast  06/16/2012   *RADIOLOGY REPORT*  Clinical Data: Persistent low back pain status post surgery in August 2013 and 05/28/2012.  History of bladder cancer with radiation therapy.  Evaluate for postoperative infection.  MRI LUMBAR SPINE WITH CONTRAST  Technique:  Multiplanar and multiecho pulse sequences of the lumbar spine were obtained with intravenous contrast. As requested, this is a limited study consisting of T1-weighted images pre and post contrast.  No T2-weighted imaging was obtained.  Contrast: 19mL MULTIHANCE GADOBENATE DIMEGLUMINE 529 MG/ML IV SOLN  Comparison: Office radiographs 06/09/2012, abdominal pelvic CT 04/23/2012 and lumbar MRI 03/05/2012.  Findings: The patient has undergone laminectomy and PLIF from L3- L5.  The hardware appears stable in position compared with the postoperative radiographs.  The lumbar alignment is normal.  There is mild nonspecific decreased T1 signal and enhancement within the endplates at the operative levels.  No cortical destruction is identified.  There is a peripheral enhancing fluid collection within the subcutaneous fat at the operative levels.  This measures approximately 7.1 x 3.7 x 3.6 cm. There is also a small amount of fluid within the laminectomy bed, best seen on the postcontrast images.  No epidural fluid collection or abnormal intradural enhancement is identified.  No significant disc space findings are demonstrated from T11-T12 through L2-L3.  L3-L4:  Postsurgical changes status post laminectomy and PLIF as described above. There is epidural enhancement  surrounding the thecal sac.  No significant residual spinal stenosis identified. No residual synovial cyst is identified on T1-weighted imaging.  L4-L5:  Postsurgical changes status post laminectomy and PLIF as described above.  No significant residual spinal stenosis identified.  No residual synovial cyst is identified on T1-weighted imaging.  L5-S1:  Disc height is maintained.  There is stable mild bilateral facet hypertrophy.  No spinal stenosis or nerve root encroachment.  IMPRESSION:  1.  Decompression of the spinal canal status post laminectomy and PLIF from L3-L5. 2.  Nonspecific fluid collections within the laminectomy bed and overlying subcutaneous fat. 3.  Nonspecific endplate enhancement at the operative levels.  No cortical destruction or specific signs of infection identified on T1-weighted imaging.   Original Report Authenticated By: Carey Bullocks, M.D.   Dg C-arm [phone] Min  05/28/2012   *RADIOLOGY REPORT*  Clinical Data: L3-5 fusion.  LUMBAR SPINE - 2-3 VIEW, DG C-ARM 61-120 MIN  Comparison: Lumbar spine MRI 03/05/2012.  Findings: We are provided with two fluoroscopic intraoperative spot views of the lumbar spine.  Images demonstrate  pedicle screws, stabilization bars and interbody spacers from L3-L5.  Laminectomy defect at the fusion levels also seen. Hardware appears intact.  No fracture is identified.  IMPRESSION: L3-5 PLIF  Original Report Authenticated By: Holley Dexter, M.D.    Antibiotics:  Anti-infectives   Start     Dose/Rate Route Frequency Ordered Stop   06/19/12 0600  vancomycin (VANCOCIN) 1,250 mg in sodium chloride 0.9 % 250 mL IVPB     1,250 mg 166.7 mL/hr over 90 Minutes Intravenous Every 12 hours 06/18/12 1936     06/17/12 0830  cefTRIAXone (ROCEPHIN) 2 g in dextrose 5 % 50 mL IVPB     2 g 100 mL/hr over 30 Minutes Intravenous Every 12 hours 06/17/12 0759     06/17/12 0600  vancomycin (VANCOCIN) IVPB 750 mg/150 ml premix  Status:  Discontinued     750 mg 150  mL/hr over 60 Minutes Intravenous Every 12 hours 06/16/12 2216 06/18/12 1936   06/16/12 1734  bacitracin 50,000 Units in sodium chloride irrigation 0.9 % 500 mL irrigation  Status:  Discontinued       As needed 06/16/12 1734 06/16/12 1822   06/16/12 1700  cefTRIAXone (ROCEPHIN) 1 g in dextrose 5 % 50 mL IVPB  Status:  Discontinued     1 g 100 mL/hr over 30 Minutes Intravenous To Neuro OR-Station #32 06/16/12 1649 06/16/12 2105   06/16/12 1644  bacitracin 16109 UNITS injection    Comments:  Reece Packer: cabinet override      06/16/12 1644 06/17/12 0444      Discharge Exam: Blood pressure 166/99, pulse 71, temperature 97.9 F (36.6 C), temperature source Oral, resp. rate 18, height 6\' 2"  (1.88 m), weight 89.5 kg (197 lb 5 oz), SpO2 96.00%. Neurologic: Grossly normal Incision clean dry and intact no evidence of infection  Discharge Medications:     Medication List    STOP taking these medications       doxycycline 100 MG capsule  Commonly known as:  MONODOX     sulfamethoxazole-trimethoprim 800-160 MG per tablet  Commonly known as:  BACTRIM DS      TAKE these medications       alendronate 70 MG tablet  Commonly known as:  FOSAMAX  Take 70 mg by mouth every 7 (seven) days. Take with a full glass of water on an empty stomach. Take on Fridays     aspirin EC 81 MG tablet  Take 81 mg by mouth daily with supper.     b complex vitamins tablet  Take 1 tablet by mouth daily with lunch.     CALTRATE 600+D PO  Take 1 tablet by mouth 2 (two) times daily. At lunch and dinner     CoQ10 200 MG Caps  Take 200 mg by mouth daily with lunch.     ibuprofen 200 MG tablet  Commonly known as:  ADVIL,MOTRIN  Take 600 mg by mouth every 6 (six) hours as needed for pain (takes 3 (200mg ) tablets prn pain).     methocarbamol 500 MG tablet  Commonly known as:  ROBAXIN  Take 1 tablet (500 mg total) by mouth every 6 (six) hours as needed.     multivitamin with minerals Tabs  Take 1 tablet  by mouth daily with lunch. Centrum     omeprazole 40 MG capsule  Commonly known as:  PRILOSEC  Take 40 mg by mouth daily.     Oxycodone HCl 10 MG Tabs  Take 1 tablet (10 mg total) by mouth every 6 (six) hours as needed.     PARoxetine 40 MG tablet  Commonly known as:  PAXIL  Take 40 mg by mouth daily.  simvastatin 20 MG tablet  Commonly known as:  ZOCOR  Take 20 mg by mouth daily with supper.     SYSTANE OP  Apply 1 drop to eye daily as needed (for dry eyes).        Disposition: Home   Final Dx: Repair of pseudomeningocele      Discharge Orders   Future Orders Complete By Expires     Call MD for:  difficulty breathing, headache or visual disturbances  As directed     Call MD for:  persistant dizziness or light-headedness  As directed     Call MD for:  persistant nausea and vomiting  As directed     Call MD for:  redness, tenderness, or signs of infection (pain, swelling, redness, odor or green/yellow discharge around incision site)  As directed     Call MD for:  severe uncontrolled pain  As directed     Call MD for:  temperature >100.4  As directed     Diet - low sodium heart healthy  As directed     Discharge instructions  As directed     Comments:      No lifting bending or strenuous activity. May shower normally    Increase activity slowly  As directed        Follow-up Information   Follow up with Alan Drummer S, MD In 2 weeks.   Contact information:   [address] [place] [address] [phone]        Signed: Tia Alert 06/20/2012, 9:47 AM

## 2012-06-21 LAB — ANAEROBIC CULTURE

## 2013-02-27 ENCOUNTER — Other Ambulatory Visit: Payer: Self-pay | Admitting: Urology

## 2013-03-03 ENCOUNTER — Encounter (HOSPITAL_BASED_OUTPATIENT_CLINIC_OR_DEPARTMENT_OTHER): Payer: Self-pay | Admitting: *Deleted

## 2013-03-06 ENCOUNTER — Encounter (HOSPITAL_BASED_OUTPATIENT_CLINIC_OR_DEPARTMENT_OTHER): Payer: Self-pay | Admitting: *Deleted

## 2013-03-06 NOTE — Progress Notes (Signed)
SPOKE W/ PT WIFE. NPO AFTER MN. ARRIVE AT 3825. NEEDS ISTAT. CURRENT EKG IN CHART AND EPIC. WILL TAKE PAXIL AND PRILOSEC AM DOS W/ SIPS OF WATER.

## 2013-03-12 NOTE — H&P (Signed)
History of Present Illness     F/u -    1-Bladder cancer - present gross hematuria. He is a former smoker and quit smoking in 1989. He has no chemo/XRT exposure. No dye/solvent exposure.   -Feb 2012 PSA 0.5  -Mar 2014 - gross hematuria referred by Dr. Jori Moll polite March 2014. Prostate nodule on DRE, PSA 0.5  CT - 10 mm right exophytic renal mass; 12 mm LLP stone; bladder mass - normal upper tracts  -Apr 2014 Cysto, bbx, [facility] - LG Ta left bladder  EUA - normal  -Jul 2014 cystoscopy - normal    2-Renal neoplasm  -Mar 2014 right 10 mm exophytic mass  -Jul 2014 - 67mm left renal isoechoic RUP exopytic mass, cystic/hypoechoic 1.4 cm LUP - both stable.     3-BPH  He has been told he had BPH, elevated PSA and had a biopsy of prostate about 20 yrs.   -Mar 2014 PSA 0.52  -Apr 2014 DRE in OR benign    4-Nephrolithiasis  -March 2014 - L. spine x-ray - 12 mm stone over the expected location of the left lower pole  -Mar 2014 CT - 12 mm LLP stone   -Jul 2014 KUB - stable 12 mm LLP stone     5-hypogonadism-  -Nov 2014 - pt start testosterone 200 mg IM q 2 weeks with Dr. Shelia Media.  -Jan 2015 - improved libido, AM erections.       JAN 2015 Interval Hx  He returns and has been voiding well. No gross hematuria.     Renal US today - comparison to CT March 2014, KUB Jul 2014, I reviewed all the images, please see the technician sheet for details, findings: The right kidney contained an 8 mm hypoechoic structure in the right upper pole exophytic which was stable. There was no other mass or hydronephrosis. There was no stone. The left kidney containing a hypoechoic 1.6 cm left upper pole lesion that appeared stable. There was a 1 cm left lower pole stone - stable. The bladder appeared normal. The postvoid was 12.8 mL.   Past Medical History Problems  1. History of Anxiety (300.00) 2. History of Arthritis (V13.4) 3. History of esophageal reflux (V12.79) 4. History of  osteopenia (V13.59) 5. History of Osteoporosis (733.00)  Surgical History Problems  1. History of Bladder Injection Of Cancer Treatment 2. History of Cataract Surgery 3. History of Cystoscopy With Fulguration Small Lesion (5-69mm) 4. History of Knee Replacement 5. History of Spine Repair  Current Meds 1. Caltrate 600 TABS;  Therapy: (Recorded:18Mar2014) to Recorded 2. Centrum TABS;  Therapy: (Recorded:18Mar2014) to Recorded 3. CoQ-10 100 MG Oral Capsule;  Therapy: (Recorded:18Mar2014) to Recorded 4. Fosamax 70 MG Oral Tablet;  Therapy: (Recorded:18Mar2014) to Recorded 5. Mobic 15 MG Oral Tablet;  Therapy: (Recorded:18Mar2014) to Recorded 6. PARoxetine HCl - 40 MG Oral Tablet;  Therapy: (Recorded:18Mar2014) to Recorded 7. PriLOSEC 40 MG Oral Capsule Delayed Release;  Therapy: (Recorded:18Mar2014) to Recorded 8. Simvastatin 20 MG Oral Tablet;  Therapy: (Recorded:18Mar2014) to Recorded 9. Testosterone Cypionate 200 MG/ML OIL;  Therapy: (Recorded:28Jan2015) to Recorded 10. Vitamin B-Complex Oral Tablet;   Therapy: (Recorded:18Mar2014) to Recorded  Allergies Medication  1. Vicodin TABS  Family History Problems  1. Family history of Death In The Family Father   Died age 64-Heart failure 2. Family history of Death In The Family Mother   Died dage 74-Rare blood disorder 3. Family history of Family Health Status Number Of Children   2 daughters  Social History  Problems  1. Alcohol Use   1-2 glasses of wine a day 2. Caffeine Use   4 per day 3. Former smoker (V15.82)   Smoked 1 ppd for 40 yrs and quit 1990 4. Marital History - Currently Married 5. Occupation: Retired  Engineer, site Vital Signs [Data Includes: Last 1 Day]  Recorded: 28Jan2015 09:53AM  Blood Pressure: 152 / 88 Temperature: 98.2 F Heart Rate: 67  Physical Exam Constitutional: Well nourished and well developed . No acute distress.  Pulmonary: No respiratory distress and normal respiratory rhythm and  effort.  Cardiovascular: Heart rate and rhythm are normal . No peripheral edema.  Neuro/Psych:. Mood and affect are appropriate.    Results/Data Urine [Data Includes: Last 1 Day]   [id number]  COLOR YELLOW   APPEARANCE CLEAR   SPECIFIC GRAVITY 1.025   pH 6.0   GLUCOSE NEG mg/dL  BILIRUBIN NEG   KETONE NEG mg/dL  BLOOD TRACE   PROTEIN NEG mg/dL  UROBILINOGEN 0.2 mg/dL  NITRITE NEG   LEUKOCYTE ESTERASE NEG   SQUAMOUS EPITHELIAL/HPF RARE   WBC NONE SEEN WBC/hpf  RBC 3-6 RBC/hpf  BACTERIA RARE   CRYSTALS NONE SEEN   CASTS NONE SEEN    Procedure    Procedure: Cystoscopy   Indication: Bladder Mass.  Informed Consent: Risks, benefits, and potential adverse events were discussed and informed consent was obtained from the patient.  Prep: The patient was prepped with betadine.  Procedure Note:  Urethral meatus:. No abnormalities.  Anterior urethra: No abnormalities.  Prostatic urethra: No abnormalities.  Bladder: Visulization was clear. The ureteral orifices were in the normal anatomic position bilaterally and had clear efflux of urine. Examination of the bladder demonstrated trabeculation. A papillary tumor was seen in the bladder. This tumor was located on the right side, on the posterior aspect of the bladder. The patient tolerated the procedure well.  Complications: None.    Assessment Assessed  1. Bladder cancer (188.9)  Plan Bladder cancer  1. Follow-up Schedule Surgery Office  Follow-up  Status: Hold For - Appointment   Requested for: [phone]  Health Maintenance  2. UA With REFLEX; [Do Not Release]; Status:Complete;1    Done: [id number] 09:10AM   1 Amended By: Festus Aloe; Feb 25 2013 10:51 AM EST  Discussion/Summary  Discussed small, early papillary recurrence - nature, r/b/a to cysto, bladder bx and [facility] - all questions answered. He elects to proceed. Plan cysto in 6 mo with renal U/S if path low grade.       1  cc; Dr. Delfina Redwood      1 Amended By:  Festus Aloe; Feb 25 2013 10:51 AM EST  Signatures Electronically signed by : Festus Aloe, M.D.; Feb 25 2013 10:52AM EST

## 2013-03-13 ENCOUNTER — Ambulatory Visit (HOSPITAL_BASED_OUTPATIENT_CLINIC_OR_DEPARTMENT_OTHER)
Admission: RE | Admit: 2013-03-13 | Discharge: 2013-03-13 | Disposition: A | Discharge status: Home / Self Care | Payer: Medicare Other | Source: Ambulatory Visit | Attending: Urology | Admitting: Urology

## 2013-03-13 ENCOUNTER — Encounter (HOSPITAL_BASED_OUTPATIENT_CLINIC_OR_DEPARTMENT_OTHER)
Admission: RE | Disposition: A | Discharge status: Home / Self Care | Payer: Self-pay | Source: Ambulatory Visit | Attending: Urology

## 2013-03-13 ENCOUNTER — Encounter (HOSPITAL_BASED_OUTPATIENT_CLINIC_OR_DEPARTMENT_OTHER): Payer: Medicare Other | Admitting: Anesthesiology

## 2013-03-13 ENCOUNTER — Ambulatory Visit (HOSPITAL_BASED_OUTPATIENT_CLINIC_OR_DEPARTMENT_OTHER): Payer: Medicare Other | Admitting: Anesthesiology

## 2013-03-13 ENCOUNTER — Encounter (HOSPITAL_BASED_OUTPATIENT_CLINIC_OR_DEPARTMENT_OTHER): Payer: Self-pay

## 2013-03-13 DIAGNOSIS — M899 Disorder of bone, unspecified: Secondary | ICD-10-CM | POA: Insufficient documentation

## 2013-03-13 DIAGNOSIS — Z96659 Presence of unspecified artificial knee joint: Secondary | ICD-10-CM | POA: Insufficient documentation

## 2013-03-13 DIAGNOSIS — M810 Age-related osteoporosis without current pathological fracture: Secondary | ICD-10-CM | POA: Insufficient documentation

## 2013-03-13 DIAGNOSIS — Z79899 Other long term (current) drug therapy: Secondary | ICD-10-CM | POA: Insufficient documentation

## 2013-03-13 DIAGNOSIS — N400 Enlarged prostate without lower urinary tract symptoms: Secondary | ICD-10-CM | POA: Insufficient documentation

## 2013-03-13 DIAGNOSIS — C679 Malignant neoplasm of bladder, unspecified: Secondary | ICD-10-CM | POA: Insufficient documentation

## 2013-03-13 DIAGNOSIS — Z87891 Personal history of nicotine dependence: Secondary | ICD-10-CM | POA: Insufficient documentation

## 2013-03-13 DIAGNOSIS — K219 Gastro-esophageal reflux disease without esophagitis: Secondary | ICD-10-CM | POA: Insufficient documentation

## 2013-03-13 DIAGNOSIS — N289 Disorder of kidney and ureter, unspecified: Secondary | ICD-10-CM | POA: Insufficient documentation

## 2013-03-13 DIAGNOSIS — E291 Testicular hypofunction: Secondary | ICD-10-CM | POA: Insufficient documentation

## 2013-03-13 DIAGNOSIS — R310 Gross hematuria: Secondary | ICD-10-CM | POA: Insufficient documentation

## 2013-03-13 DIAGNOSIS — M949 Disorder of cartilage, unspecified: Secondary | ICD-10-CM

## 2013-03-13 HISTORY — DX: Hyperlipidemia, unspecified: E78.5

## 2013-03-13 HISTORY — PX: CYSTOSCOPY WITH BIOPSY: SHX5122

## 2013-03-13 LAB — POCT I-STAT 4, (NA,K, GLUC, HGB,HCT)
Glucose, Bld: 101 mg/dL — ABNORMAL HIGH (ref 70–99)
HCT: 44 % (ref 39.0–52.0)
Hemoglobin: 15 g/dL (ref 13.0–17.0)
Potassium: 3.7 mEq/L (ref 3.7–5.3)
Sodium: 141 mEq/L (ref 137–147)

## 2013-03-13 SURGERY — CYSTOSCOPY, WITH BIOPSY
Anesthesia: General | Site: Bladder

## 2013-03-13 MED ORDER — FENTANYL CITRATE 0.05 MG/ML IJ SOLN
25.0000 ug | INTRAMUSCULAR | Status: DC | PRN
Start: 2013-03-13 — End: 2013-03-13
  Filled 2013-03-13: qty 1

## 2013-03-13 MED ORDER — PROPOFOL 10 MG/ML IV BOLUS
INTRAVENOUS | Status: DC | PRN
  Administered 2013-03-13: 180 mg via INTRAVENOUS

## 2013-03-13 MED ORDER — FENTANYL CITRATE 0.05 MG/ML IJ SOLN
INTRAMUSCULAR | Status: DC | PRN
  Administered 2013-03-13: 12.5 ug via INTRAVENOUS
  Administered 2013-03-13: 25 ug via INTRAVENOUS
  Administered 2013-03-13: 12.5 ug via INTRAVENOUS

## 2013-03-13 MED ORDER — CEFAZOLIN SODIUM-DEXTROSE 2-3 GM-% IV SOLR
2.0000 g | INTRAVENOUS | Status: DC
  Filled 2013-03-13: qty 50

## 2013-03-13 MED ORDER — PHENAZOPYRIDINE HCL 200 MG PO TABS: 200.0000 mg | ORAL_TABLET | Freq: Three times a day (TID) | ORAL | Status: DC | PRN

## 2013-03-13 MED ORDER — PHENAZOPYRIDINE HCL 200 MG PO TABS
200.0000 mg | ORAL_TABLET | Freq: Three times a day (TID) | ORAL | Status: DC
Start: 2013-03-13 — End: 2013-03-13
  Administered 2013-03-13: 200 mg via ORAL
  Filled 2013-03-13: qty 1

## 2013-03-13 MED ORDER — MITOMYCIN CHEMO FOR BLADDER INSTILLATION 40 MG
40.0000 mg | Freq: Once | INTRAVENOUS | Status: AC
  Administered 2013-03-13: 40 mg via INTRAVESICAL
  Filled 2013-03-13: qty 40

## 2013-03-13 MED ORDER — CEFAZOLIN SODIUM-DEXTROSE 2-3 GM-% IV SOLR
INTRAVENOUS | Status: DC | PRN
  Administered 2013-03-13: 2 g via INTRAVENOUS

## 2013-03-13 MED ORDER — ONDANSETRON HCL 4 MG/2ML IJ SOLN
INTRAMUSCULAR | Status: DC | PRN
  Administered 2013-03-13: 4 mg via INTRAVENOUS

## 2013-03-13 MED ORDER — PHENAZOPYRIDINE HCL 100 MG PO TABS
ORAL_TABLET | ORAL | Status: AC
  Filled 2013-03-13: qty 2

## 2013-03-13 MED ORDER — DEXAMETHASONE SODIUM PHOSPHATE 4 MG/ML IJ SOLN
INTRAMUSCULAR | Status: DC | PRN
  Administered 2013-03-13: 4 mg via INTRAVENOUS

## 2013-03-13 MED ORDER — LACTATED RINGERS IV SOLN
INTRAVENOUS | Status: DC
  Administered 2013-03-13: 11:00:00 via INTRAVENOUS
  Filled 2013-03-13: qty 1000

## 2013-03-13 MED ORDER — EPHEDRINE SULFATE 50 MG/ML IJ SOLN
INTRAMUSCULAR | Status: DC | PRN
  Administered 2013-03-13 (×2): 10 mg via INTRAVENOUS

## 2013-03-13 MED ORDER — STERILE WATER FOR IRRIGATION IR SOLN
Status: DC | PRN
  Administered 2013-03-13: 3000 mL

## 2013-03-13 MED ORDER — KETOROLAC TROMETHAMINE 30 MG/ML IJ SOLN
INTRAMUSCULAR | Status: DC | PRN
  Administered 2013-03-13: 15 mg via INTRAVENOUS

## 2013-03-13 MED ORDER — BELLADONNA ALKALOIDS-OPIUM 16.2-60 MG RE SUPP
RECTAL | Status: DC | PRN
  Administered 2013-03-13: 1 via RECTAL

## 2013-03-13 MED ORDER — FENTANYL CITRATE 0.05 MG/ML IJ SOLN
INTRAMUSCULAR | Status: AC
  Filled 2013-03-13: qty 2

## 2013-03-13 MED ORDER — LACTATED RINGERS IV SOLN
INTRAVENOUS | Status: DC | PRN
  Administered 2013-03-13 (×2): via INTRAVENOUS

## 2013-03-13 MED ORDER — BELLADONNA ALKALOIDS-OPIUM 16.2-60 MG RE SUPP
RECTAL | Status: AC
Start: 2013-03-13 — End: 2013-03-13
  Filled 2013-03-13: qty 1

## 2013-03-13 MED ORDER — LIDOCAINE HCL (CARDIAC) 20 MG/ML IV SOLN
INTRAVENOUS | Status: DC | PRN
  Administered 2013-03-13: 60 mg via INTRAVENOUS

## 2013-03-13 SURGICAL SUPPLY — 21 items
BAG DRAIN URO-CYSTO SKYTR STRL (DRAIN) ×3 IMPLANT
BAG DRN UROCATH (DRAIN) ×1
BAG URINE DRAINAGE (UROLOGICAL SUPPLIES) ×2 IMPLANT
CANISTER SUCT LVC 12 LTR MEDI- (MISCELLANEOUS) ×2 IMPLANT
CATH FOLEY 2WAY SLVR  5CC 18FR (CATHETERS) ×2
CATH FOLEY 2WAY SLVR 5CC 18FR (CATHETERS) IMPLANT
CLOTH BEACON ORANGE TIMEOUT ST (SAFETY) ×3 IMPLANT
DRAPE CAMERA CLOSED 9X96 (DRAPES) ×3 IMPLANT
ELECT REM PT RETURN 9FT ADLT (ELECTROSURGICAL) ×3
ELECTRODE REM PT RTRN 9FT ADLT (ELECTROSURGICAL) ×1 IMPLANT
GLOVE BIO SURGEON STRL SZ7.5 (GLOVE) ×3 IMPLANT
GLOVE BIOGEL PI IND STRL 7.5 (GLOVE) IMPLANT
GLOVE BIOGEL PI INDICATOR 7.5 (GLOVE) ×2
GLOVE SURG SS PI 7.5 STRL IVOR (GLOVE) ×2 IMPLANT
GOWN STRL REIN XL XLG (GOWN DISPOSABLE) ×1 IMPLANT
GOWN STRL REUS W/TWL XL LVL3 (GOWN DISPOSABLE) ×4 IMPLANT
HOLDER FOLEY CATH W/STRAP (MISCELLANEOUS) ×2 IMPLANT
NEEDLE HYPO 22GX1.5 SAFETY (NEEDLE) IMPLANT
NS IRRIG 500ML POUR BTL (IV SOLUTION) IMPLANT
PACK CYSTOSCOPY (CUSTOM PROCEDURE TRAY) ×3 IMPLANT
WATER STERILE IRR 3000ML UROMA (IV SOLUTION) ×3 IMPLANT

## 2013-03-13 NOTE — Transfer of Care (Signed)
Immediate Anesthesia Transfer of Care Note  Patient: Timothy Osborn  Procedure(s) Performed: Procedure(s) (LRB): CYSTOSCOPY WITH  BLADDER BIOPSY MITOMYCIN - C (N/A)  Patient Location: PACU  Anesthesia Type: General  Level of Consciousness: awake, sedated, patient cooperative and responds to stimulation  Airway & Oxygen Therapy: Patient Spontanous Breathing and Patient connected to face mask oxygen  Post-op Assessment: Report given to PACU RN, Post -op Vital signs reviewed and stable and Patient moving all extremities  Post vital signs: Reviewed and stable  Complications: No apparent anesthesia complications

## 2013-03-13 NOTE — Anesthesia Procedure Notes (Signed)
Procedure Name: LMA Insertion Date/Time: 03/13/2013 12:13 PM Performed by: Justice Rocher Pre-anesthesia Checklist: Patient identified, Emergency Drugs available, Suction available and Patient being monitored Patient Re-evaluated:Patient Re-evaluated prior to inductionOxygen Delivery Method: Circle System Utilized Preoxygenation: Pre-oxygenation with 100% oxygen Intubation Type: IV induction Ventilation: Mask ventilation without difficulty LMA: LMA inserted LMA Size: 5.0 Number of attempts: 1 Airway Equipment and Method: bite block Placement Confirmation: positive ETCO2 Tube secured with: Tape Dental Injury: Teeth and Oropharynx as per pre-operative assessment

## 2013-03-13 NOTE — Discharge Instructions (Signed)
Cystoscopy, Care After °Refer to this sheet in the next few weeks. These instructions provide you with information on caring for yourself after your procedure. Your caregiver may also give you more specific instructions. Your treatment has been planned according to current medical practices, but problems sometimes occur. Call your caregiver if you have any problems or questions after your procedure. °HOME CARE INSTRUCTIONS  °Things you can do to ease any discomfort after your procedure include: °· Drinking enough water and fluids to keep your urine clear or pale yellow. °· Taking a warm bath to relieve any burning feelings. °SEEK IMMEDIATE MEDICAL CARE IF:  °· You have an increase in blood in your urine. °· You notice blood clots in your urine. °· You have difficulty passing urine. °· You have the chills. °· You have abdominal pain. °· You have a fever or persistent symptoms for more than 2 3 days. °· You have a fever and your symptoms suddenly get worse. °MAKE SURE YOU:  °· Understand these instructions. °· Will watch your condition. °· Will get help right away if you are not doing well or get worse. °Document Released: 08/04/2004 Document Revised: 09/17/2012 Document Reviewed: 07/09/2011 °[employer]® Patient Information ©2014 [employer]. ° °Post Anesthesia Home Care Instructions ° °Activity: °Get plenty of rest for the remainder of the day. A responsible adult should stay with you for 24 hours following the procedure.  °For the next 24 hours, DO NOT: °-Drive a car °-Operate machinery °-Drink alcoholic beverages °-Take any medication unless instructed by your physician °-Make any legal decisions or sign important papers. ° °Meals: °Start with liquid foods such as gelatin or soup. Progress to regular foods as tolerated. Avoid greasy, spicy, heavy foods. If nausea and/or vomiting occur, drink only clear liquids until the nausea and/or vomiting subsides. Call your physician if vomiting continues. ° °Special  Instructions/Symptoms: °Your throat may feel dry or sore from the anesthesia or the breathing tube placed in your throat during surgery. If this causes discomfort, gargle with warm salt water. The discomfort should disappear within 24 hours. ° °Post Anesthesia Home Care Instructions ° °Activity: °Get plenty of rest for the remainder of the day. A responsible adult should stay with you for 24 hours following the procedure.  °For the next 24 hours, DO NOT: °-Drive a car °-Operate machinery °-Drink alcoholic beverages °-Take any medication unless instructed by your physician °-Make any legal decisions or sign important papers. ° °Meals: °Start with liquid foods such as gelatin or soup. Progress to regular foods as tolerated. Avoid greasy, spicy, heavy foods. If nausea and/or vomiting occur, drink only clear liquids until the nausea and/or vomiting subsides. Call your physician if vomiting continues. ° °Special Instructions/Symptoms: °Your throat may feel dry or sore from the anesthesia or the breathing tube placed in your throat during surgery. If this causes discomfort, gargle with warm salt water. The discomfort should disappear within 24 hours. ° °

## 2013-03-13 NOTE — Anesthesia Preprocedure Evaluation (Addendum)
Anesthesia Evaluation  Patient identified by MRN, date of birth, ID band Patient awake    Reviewed: Allergy & Precautions, H&P , NPO status , Patient's Chart, lab work & pertinent test results  Airway Mallampati: II TM Distance: >3 FB Neck ROM: Full    Dental no notable dental hx.    Pulmonary former smoker,  breath sounds clear to auscultation  Pulmonary exam normal       Cardiovascular Exercise Tolerance: Good negative cardio ROS  Rhythm:Regular Rate:Normal     Neuro/Psych PSYCHIATRIC DISORDERS Anxiety Depression negative neurological ROS     GI/Hepatic Neg liver ROS, hiatal hernia, GERD-  Medicated,  Endo/Other  negative endocrine ROS  Renal/GU Renal disease  negative genitourinary   Musculoskeletal negative musculoskeletal ROS (+)   Abdominal   Peds negative pediatric ROS (+)  Hematology negative hematology ROS (+)   Anesthesia Other Findings   Reproductive/Obstetrics negative OB ROS                         Anesthesia Physical Anesthesia Plan  ASA: II  Anesthesia Plan: General   Post-op Pain Management:    Induction: Intravenous  Airway Management Planned: LMA  Additional Equipment:   Intra-op Plan:   Post-operative Plan: Extubation in OR  Informed Consent: I have reviewed the patients History and Physical, chart, labs and discussed the procedure including the risks, benefits and alternatives for the proposed anesthesia with the patient or authorized representative who has indicated his/her understanding and acceptance.   Dental advisory given  Plan Discussed with: CRNA  Anesthesia Plan Comments:         Anesthesia Quick Evaluation

## 2013-03-13 NOTE — Op Note (Signed)
Preoperative diagnosis: Bladder cancer Postoperative diagnosis: Bladder cancer, prostate hard area or nodule  Procedure: Exam under anesthesia Cystoscopy, bladder biopsy fulguration x2 Installation of mitomycin C. in the bladder in PACU   Surgeon: Shauntia Levengood  Type of anesthesia: Gen.  Findings: On exam under anesthesia the penis was normal without mass or lesion, the testicles were descended and palpably normal. On digital rectal exam there was a small nodule at the left apex of the prostate. The prostate showed only mild enlargement and was otherwise palpably normal.  On cystoscopy the urethra appeared normal, the prostatic urethra was mildly elongated with some visual obstruction and a high median bar, the bladder mucosa showed an early papillary recurrence of tumor or above the right ureteral orifice. The scar from the prior resection showed some changes that could be early recurrence. This was above the left UO and also biopsy. Otherwise the mucosa appeared normal. There was moderate trabeculation of the bladder. The trigone and ureteral orifices were in their normal orthotopic position. There were no foreign bodies or stones in the bladder.   Description of procedure: After consent was obtained patient brought to the operating room. After adequate anesthesia he was placed in lithotomy position. A timeout was performed to confirm the patient and procedure. An exam under anesthesia was performed and I placed a B&O suppository. He was then prepped and draped in usual sterile fashion. Cystoscope was passed per urethra and the bladder examined with a 12 and 70 lens. The cold cup biopsy forceps were then used to biopsy the right posterior bladder, then the left posterior bladder. These areas were carefully fulgurated with careful attention paid to the ureteral orifices which were not involved. There was good clear reflux from both ureteral orifices after biopsy and fulguration. There was excellent  hemostasis at low-pressure. An 97 French Foley was placed.  The patient was awakened and taken to recovery room in stable condition.  Mitomycin-C will be instilled in the bladder in the PACU  Complications: None Blood loss: Minimal  Specimens: #1 right bladder biopsy #2 left bladder biopsy  Drains: 18 French Foley

## 2013-03-13 NOTE — Interval H&P Note (Signed)
History and Physical Interval Note:  03/13/2013 12:08 PM  Timothy Osborn  has presented today for surgery, with the diagnosis of BLADDER CANCER  The various methods of treatment have been discussed with the patient and family. After consideration of risks, benefits and other options for treatment, the patient has consented to  Procedure(s): CYSTOSCOPY WITH  BLADDER BIOPSY MITOMYCIN - C (N/A) as a surgical intervention .  The patient's history has been reviewed, patient examined, no change in status, stable for surgery.  I have reviewed the patient's chart and labs.  Questions were answered to the patient's satisfaction.  We discussed bladder ca stage, grade, propensity to recur, progress -- all of which aid in the decision to give [facility] in PACU and / or BCG in office adjuvantly. We discussed nature, r/b/a to [facility] and BCG. He elects to proceed.    Fredricka Bonine

## 2013-03-16 ENCOUNTER — Encounter (HOSPITAL_BASED_OUTPATIENT_CLINIC_OR_DEPARTMENT_OTHER): Payer: Self-pay | Admitting: Urology

## 2013-07-29 HISTORY — PX: SHOULDER ARTHROSCOPY WITH ROTATOR CUFF REPAIR: SHX5685

## 2013-08-10 ENCOUNTER — Other Ambulatory Visit: Payer: Self-pay | Admitting: Sports Medicine

## 2013-08-10 DIAGNOSIS — M25511 Pain in right shoulder: Secondary | ICD-10-CM

## 2013-08-12 ENCOUNTER — Ambulatory Visit
Admission: RE | Admit: 2013-08-12 | Discharge: 2013-08-12 | Disposition: A | Discharge status: Home / Self Care | Payer: Medicare Other | Source: Ambulatory Visit | Attending: Sports Medicine | Admitting: Sports Medicine

## 2013-08-12 DIAGNOSIS — M25511 Pain in right shoulder: Secondary | ICD-10-CM

## 2015-04-25 DIAGNOSIS — N200 Calculus of kidney: Secondary | ICD-10-CM | POA: Diagnosis not present

## 2015-04-25 DIAGNOSIS — Z0000 Encounter for general adult medical examination without abnormal findings: Secondary | ICD-10-CM | POA: Diagnosis not present

## 2015-04-25 DIAGNOSIS — C672 Malignant neoplasm of lateral wall of bladder: Secondary | ICD-10-CM | POA: Diagnosis not present

## 2015-04-25 DIAGNOSIS — N402 Nodular prostate without lower urinary tract symptoms: Secondary | ICD-10-CM | POA: Diagnosis not present

## 2015-04-26 DIAGNOSIS — H26493 Other secondary cataract, bilateral: Secondary | ICD-10-CM | POA: Diagnosis not present

## 2015-04-26 DIAGNOSIS — D3132 Benign neoplasm of left choroid: Secondary | ICD-10-CM | POA: Diagnosis not present

## 2015-04-26 DIAGNOSIS — D3131 Benign neoplasm of right choroid: Secondary | ICD-10-CM | POA: Diagnosis not present

## 2015-04-26 DIAGNOSIS — H04123 Dry eye syndrome of bilateral lacrimal glands: Secondary | ICD-10-CM | POA: Diagnosis not present

## 2015-04-26 DIAGNOSIS — Z961 Presence of intraocular lens: Secondary | ICD-10-CM | POA: Diagnosis not present

## 2015-04-26 DIAGNOSIS — H43813 Vitreous degeneration, bilateral: Secondary | ICD-10-CM | POA: Diagnosis not present

## 2015-04-26 DIAGNOSIS — H35371 Puckering of macula, right eye: Secondary | ICD-10-CM | POA: Diagnosis not present

## 2015-04-27 DIAGNOSIS — E7800 Pure hypercholesterolemia, unspecified: Secondary | ICD-10-CM | POA: Diagnosis not present

## 2015-04-29 DIAGNOSIS — Z0000 Encounter for general adult medical examination without abnormal findings: Secondary | ICD-10-CM | POA: Diagnosis not present

## 2015-05-03 DIAGNOSIS — Z0000 Encounter for general adult medical examination without abnormal findings: Secondary | ICD-10-CM | POA: Diagnosis not present

## 2015-05-03 DIAGNOSIS — M810 Age-related osteoporosis without current pathological fracture: Secondary | ICD-10-CM | POA: Diagnosis not present

## 2015-05-03 DIAGNOSIS — R238 Other skin changes: Secondary | ICD-10-CM | POA: Diagnosis not present

## 2015-05-03 DIAGNOSIS — C679 Malignant neoplasm of bladder, unspecified: Secondary | ICD-10-CM | POA: Diagnosis not present

## 2015-05-03 DIAGNOSIS — E291 Testicular hypofunction: Secondary | ICD-10-CM | POA: Diagnosis not present

## 2015-05-05 DIAGNOSIS — H26491 Other secondary cataract, right eye: Secondary | ICD-10-CM | POA: Diagnosis not present

## 2015-05-17 DIAGNOSIS — M7522 Bicipital tendinitis, left shoulder: Secondary | ICD-10-CM | POA: Diagnosis not present

## 2015-05-31 DIAGNOSIS — M8580 Other specified disorders of bone density and structure, unspecified site: Secondary | ICD-10-CM | POA: Diagnosis not present

## 2015-05-31 DIAGNOSIS — M810 Age-related osteoporosis without current pathological fracture: Secondary | ICD-10-CM | POA: Diagnosis not present

## 2015-05-31 DIAGNOSIS — K5900 Constipation, unspecified: Secondary | ICD-10-CM | POA: Diagnosis not present

## 2015-10-05 DIAGNOSIS — N402 Nodular prostate without lower urinary tract symptoms: Secondary | ICD-10-CM | POA: Diagnosis not present

## 2015-10-12 DIAGNOSIS — C672 Malignant neoplasm of lateral wall of bladder: Secondary | ICD-10-CM | POA: Diagnosis not present

## 2015-10-12 DIAGNOSIS — N402 Nodular prostate without lower urinary tract symptoms: Secondary | ICD-10-CM | POA: Diagnosis not present

## 2015-10-12 DIAGNOSIS — N200 Calculus of kidney: Secondary | ICD-10-CM | POA: Diagnosis not present

## 2015-10-12 DIAGNOSIS — D49519 Neoplasm of unspecified behavior of unspecified kidney: Secondary | ICD-10-CM | POA: Diagnosis not present

## 2015-10-20 DIAGNOSIS — K5730 Diverticulosis of large intestine without perforation or abscess without bleeding: Secondary | ICD-10-CM | POA: Diagnosis not present

## 2015-10-20 DIAGNOSIS — C672 Malignant neoplasm of lateral wall of bladder: Secondary | ICD-10-CM | POA: Diagnosis not present

## 2015-10-24 ENCOUNTER — Other Ambulatory Visit: Payer: Self-pay | Admitting: Urology

## 2015-11-03 ENCOUNTER — Encounter (HOSPITAL_BASED_OUTPATIENT_CLINIC_OR_DEPARTMENT_OTHER): Payer: Self-pay | Admitting: *Deleted

## 2015-11-03 NOTE — Progress Notes (Signed)
NPO AFTER MN.  ARRIVE AT [id number].  NEEDS HG AND EKG.  WILL TAKE PROZAC AND PRILOSEC AM DOS W/ SIPS OF WATER.

## 2015-11-06 NOTE — H&P (Signed)
Office Visit Report     10/12/2015   --------------------------------------------------------------------------------   Timothy Osborn  MRN: [id number]  PRIMARY CARE:  Deland Pretty, MD  DOB: [birth year], 82 year old Male  REFERRING:  Seward Carol  SSN: -[id number]  PROVIDER:  Festus Aloe, M.D.    LOCATION:  [facility] [phone]   --------------------------------------------------------------------------------   CC: I have bladder cancer that has been treated.  HPI: Timothy Osborn is a 82 year-old male established patient who is here for follow-up of bladder cancer treatment.  His bladder cancer was superficial and limitied to the bladder lining.   He did have a TURBT.   He has not had blood in his urine recently.   Low-grade TA left bladder April 2014. Recurrence, LG Ta, January 2015 - tx with [facility]. Present gross hematuria. He is a former smoker and quit smoking in 1989.  -Last upper tract: Sep 2015 renal u/s; Mar 2014 CT Hematuria (10 mm right exophytic renal mass; 12 mm LLP stone)  -Last cystoscopy: Mar 2017      CC: I have a renal mass.  HPI: The problem is on the right side. His problem was diagnosed 04/23/2012. He has had no symptoms. He had the following x-rays done: CT Scan.   9 mm enhancing exophytic mass right MP lateral. Stable on U/S in 2015.      CC: I have kidney stones.  HPI: The problem is on the left side. He first stated noticing pain on approximately 09/29/2012. This is not his first kidney stone. He is not currently having flank pain, back pain, groin pain, nausea, vomiting, fever or chills.   KUB Mar 2017 - stable 12 mm LLP stone.      CC: I have a prostate nodule.  HPI: His prostate nodule was discovered approximately 09/29/2012. He has not had a prostate biopsy done. He has had a PSA done. His PSA has always been normal.   Sep 2017 PSA remains low at 0.52.      ALLERGIES: Vicodin TABS    MEDICATIONS: Caltrate 600 TABS Oral   Centrum TABS Oral  CoQ-10 100 MG Oral Capsule Oral  Mobic 15 MG Oral Tablet Oral  PARoxetine HCl - 40 MG Oral Tablet Oral  PriLOSEC 40 MG CPDR Oral  Simvastatin 20 MG Oral Tablet Oral  Testosterone Cypionate 200 MG/ML OIL Intramuscular  Vitamin B-Complex Oral Tablet Oral     GU PSH: Bladder Instill AntiCA Agent - 2015, 2014 Cysto Fulgurate < 0.5 cm - 2015 Cystoscopy TURBT <2 cm - 2014      PSH Notes: Rotator Cuff Repair, Bladder Injection Of Cancer Treatment, Cystoscopy With Fulguration Minor Lesion (Under 7mm), Bladder Injection Of Cancer Treatment, Cystoscopy With Fulguration Small Lesion (5-40mm), Cataract Surgery, Knee Replacement, Spine Repair   NON-GU PSH: Revise Knee Joint - 2014    GU PMH: Bladder Cancer Lateral, Malignant neoplasm of lateral wall of bladder - 04/25/2015 Kidney Stone, Nephrolithiasis - 04/25/2015 Nodular prostate w/o LUTS, Nodular prostate without lower urinary tract symptoms - 04/25/2015 Bladder, Neoplasm of Unspecified behavior, Neoplasm of bladder - 2014 BPH w/o LUTS, Benign prostatic hypertrophy without lower urinary tract symptoms - 2014 Neoplasm of unspecified behavior of unspecified kidney, Renal neoplasm - 2014 Other microscopic hematuria, Microscopic hematuria - 2014      PMH Notes:  2012-04-15 14:41:49 - Note: Arthritis   NON-GU PMH: Encounter for general adult medical examination without abnormal findings, Encounter for preventive health examination - 04/25/2015 Age-related osteoporosis without current  pathological fracture, Osteoporosis - 13-May-2012 Anxiety, Anxiety (Symptom) - May 13, 2012 Personal history of other diseases of the digestive system, History of esophageal reflux - May 13, 2012 Personal history of other diseases of the musculoskeletal system and connective tissue, History of osteopenia - May 13, 2012    FAMILY HISTORY: Death In The Family Father - Runs In Family Death In The Family Mother - Runs In Family Family Health Status Number - Runs In Family    SOCIAL HISTORY: Marital Status: Married Current Smoking Status: Patient has never smoked.  Drinks 1 drink per day. Types of alcohol consumed: Wine. Light Drinker.  Drinks 2 caffeinated drinks per day.     Notes: Former smoker, Alcohol Use, Caffeine Use, Marital History - Currently Married, Occupation: Retired   REVIEW OF SYSTEMS:    GU Review Male:   Patient reports frequent urination. Patient denies hard to postpone urination, burning/ pain with urination, get up at night to urinate, leakage of urine, stream starts and stops, trouble starting your stream, have to strain to urinate , erection problems, and penile pain.  Gastrointestinal (Upper):   Patient denies nausea, vomiting, and indigestion/ heartburn.  Gastrointestinal (Lower):   Patient denies diarrhea and constipation.  Constitutional:   Patient denies fever, night sweats, weight loss, and fatigue.  Skin:   Patient denies skin rash/ lesion and itching.  Eyes:   Patient denies blurred vision and double vision.  Ears/ Nose/ Throat:   Patient denies sore throat and sinus problems.  Hematologic/Lymphatic:   Patient reports easy bruising. Patient denies swollen glands.  Cardiovascular:   Patient denies leg swelling and chest pains.  Respiratory:   Patient denies cough and shortness of breath.  Endocrine:   Patient denies excessive thirst.  Musculoskeletal:   Patient reports joint pain. Patient denies back pain.  Neurological:   Patient denies headaches and dizziness.  Psychologic:   Patient denies depression and anxiety.   VITAL SIGNS:      10/12/2015 11:12 AM  Weight 195 lb / 88.45 kg  Height 73 in / 185.42 cm  BP 137/79 mmHg  Pulse 73 /min  Temperature 97.0 F / 36 C  BMI 25.7 kg/m   GU PHYSICAL EXAMINATION:    Anus and Perineum: No hemorrhoids. No anal stenosis. No rectal fissure, no anal fissure. No edema, no dimple, no perineal tenderness, no anal tenderness.  Scrotum: No lesions. No edema. No cysts. No warts.   Epididymides: Right: no spermatocele, no masses, no cysts, no tenderness, no induration, no enlargement. Left: no spermatocele, no masses, no cysts, no tenderness, no induration, no enlargement.  Testes: No tenderness, no swelling, no enlargement left testes. No tenderness, no swelling, no enlargement right testes. Normal location left testes. Normal location right testes. No mass, no cyst, no varicocele, no hydrocele left testes. No mass, no cyst, no varicocele, no hydrocele right testes.  Urethral Meatus: Normal size. No lesion, no wart, no discharge, no polyp. Normal location.  Penis: Circumcised, no warts, no cracks. No dorsal Peyronie's plaques, no left corporal Peyronie's plaques, no right corporal Peyronie's plaques, no scarring, no warts. No balanitis, no meatal stenosis.  Prostate: Prostate about 30 grams. Left base 5 mm prostate nodule. Left lobe normal consistency, right lobe normal consistency. Symmetrical lobes. Left lobe no tenderness, right lobe no tenderness.   Seminal Vesicles: Nonpalpable.  Sphincter Tone: Normal sphincter. No rectal tenderness. No rectal mass.    MULTI-SYSTEM PHYSICAL EXAMINATION:    Constitutional: Well-nourished. No physical deformities. Normally developed. Good grooming.  Neck: Neck symmetrical, not swollen.  Normal tracheal position.  Respiratory: No labored breathing, no use of accessory muscles.   Cardiovascular: Normal temperature, normal extremity pulses, no swelling, no varicosities.  Skin: No paleness, no jaundice, no cyanosis. No lesion, no ulcer, no rash.  Neurologic / Psychiatric: Oriented to time, oriented to place, oriented to person. No depression, no anxiety, no agitation.     PAST DATA REVIEWED:  Source Of History:  Patient   10/05/15 10/07/14 04/16/12  PSA  Total PSA 0.52 ng/dl 0.73  0.52     PROCEDURES:         Flexible Cystoscopy - 52000  Risks, benefits, and some of the potential complications of the procedure were discussed with  the patient. All questions were answered. Informed consent was obtained. Antibiotic prophylaxis was given -- Cipro. Sterile technique and intraurethral analgesia were used.  Meatus:  Normal size. Normal location. Normal condition.  Urethra:  No strictures.  External Sphincter:  Normal.  Verumontanum:  Normal.  Prostate:  Non-obstructing. No hyperplasia.  Bladder Neck:  Non-obstructing.  Ureteral Orifices:  Normal location. Normal size. Normal shape. Effluxed clear urine.  Bladder:  No trabeculation. No tumors. Normal mucosa. No stones.      The lower urinary tract was carefully examined. The procedure was well-tolerated and without complications. Antibiotic instructions were given. Instructions were given to call the office immediately for bloody urine, difficulty urinating, painful urination, fever, chills, nausea, vomiting or other illness. The patient stated that he understood these instructions and would comply with them.         Urinalysis w/Scope - 81001 Dipstick Dipstick Cont'd Micro  Specimen: Voided Bilirubin: Neg WBC/hpf: 10-20/hpf  Color: Yellow Ketones: Neg RBC/hpf: 0-2/hpf  Appearance: Clear Blood: Neg Bacteria: Rare  Specific Gravity: 1.025 Protein: Neg Cystals: NS (Not Seen)  pH: 5.0 Urobilinogen: 0.2 Casts: NS (Not Seen)  Glucose: Neg Nitrites: Neg Trichomonas: Not Present    Leukocyte Esterase: Trace Mucous: Present      Epithelial Cells: 0-5/hpf      Yeast: NS (Not Seen)      Sperm: Not Present    ASSESSMENT:      ICD-10 Details  1 GU:   Bladder Cancer Lateral - C67.2   2   Kidney Stone - N20.0   3   Nodular prostate w/o LUTS - N40.2   4   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 Hematuria With and Without I.V. Contrast - next available   X-Ray Notes: 10/20/15 History:Bladder Ca KS  Hematuria: No  Patient to see MD after exam: No  Previous exam: CT    [id number]  Where:[place]  Diabetic: No  BUN/ Creatinine:  Date of last BUN Creatinine:10/12/15  Weight in pounds:195 lbs  Allergy- IV Contrast: Yes/ No  Conflicting diabetic meds: No  Diabetic Meds:  Prior Authorization #[id number]           Schedule Return Visit: 1 Year - Office Visit, Follow up MD          Document Letter(s):  Created for Patient: Clinical Summary         Notes:   renal mass - eval with CT - bun and Cr sent.   bladder ca - NED.   prostate nodule - stable on exam. PSA remains low.   cc: Dr. Shelia Media     * Signed by Festus Aloe, M.D. on  10/12/15 at 4:10 PM (EDT)*     ADD/Phone note:  Regarding: CT abnormal  Discussion Occurred With: Patient Sgro L. Mica)  Message: Sept 2017 CT showed narrowing of the left LP infundibulum, possible tumor. A 12 mm stone sits below this in the LP. I called and discussed findings with patient and potential etiology - benign vs malignant. Discussed the nature, r/b/a to surveillance or left URS. He elects to proceed with left URS poss bx poss HLL of stone and understands he may need a stent and staged procedure.   * Signed by Festus Aloe, M.D. on 10/21/15 at 1:02 PM (EDT)*

## 2015-11-08 ENCOUNTER — Other Ambulatory Visit: Payer: Self-pay

## 2015-11-08 ENCOUNTER — Ambulatory Visit (HOSPITAL_BASED_OUTPATIENT_CLINIC_OR_DEPARTMENT_OTHER): Payer: PPO | Admitting: Anesthesiology

## 2015-11-08 ENCOUNTER — Encounter (HOSPITAL_BASED_OUTPATIENT_CLINIC_OR_DEPARTMENT_OTHER): Payer: Self-pay | Admitting: Anesthesiology

## 2015-11-08 ENCOUNTER — Encounter (HOSPITAL_BASED_OUTPATIENT_CLINIC_OR_DEPARTMENT_OTHER)
Admission: RE | Disposition: A | Discharge status: Home / Self Care | Payer: Self-pay | Source: Ambulatory Visit | Attending: Urology

## 2015-11-08 ENCOUNTER — Ambulatory Visit (HOSPITAL_BASED_OUTPATIENT_CLINIC_OR_DEPARTMENT_OTHER)
Admission: RE | Admit: 2015-11-08 | Discharge: 2015-11-08 | Disposition: A | Discharge status: Home / Self Care | Payer: PPO | Source: Ambulatory Visit | Attending: Urology | Admitting: Urology

## 2015-11-08 DIAGNOSIS — M810 Age-related osteoporosis without current pathological fracture: Secondary | ICD-10-CM | POA: Diagnosis not present

## 2015-11-08 DIAGNOSIS — Z79899 Other long term (current) drug therapy: Secondary | ICD-10-CM | POA: Diagnosis not present

## 2015-11-08 DIAGNOSIS — D4102 Neoplasm of uncertain behavior of left kidney: Secondary | ICD-10-CM | POA: Diagnosis not present

## 2015-11-08 DIAGNOSIS — K219 Gastro-esophageal reflux disease without esophagitis: Secondary | ICD-10-CM | POA: Diagnosis not present

## 2015-11-08 DIAGNOSIS — N200 Calculus of kidney: Secondary | ICD-10-CM | POA: Diagnosis not present

## 2015-11-08 DIAGNOSIS — Z791 Long term (current) use of non-steroidal anti-inflammatories (NSAID): Secondary | ICD-10-CM | POA: Diagnosis not present

## 2015-11-08 DIAGNOSIS — C672 Malignant neoplasm of lateral wall of bladder: Secondary | ICD-10-CM | POA: Insufficient documentation

## 2015-11-08 DIAGNOSIS — Z96659 Presence of unspecified artificial knee joint: Secondary | ICD-10-CM | POA: Diagnosis not present

## 2015-11-08 DIAGNOSIS — Z87891 Personal history of nicotine dependence: Secondary | ICD-10-CM | POA: Insufficient documentation

## 2015-11-08 DIAGNOSIS — F329 Major depressive disorder, single episode, unspecified: Secondary | ICD-10-CM | POA: Insufficient documentation

## 2015-11-08 DIAGNOSIS — D3012 Benign neoplasm of left renal pelvis: Secondary | ICD-10-CM | POA: Diagnosis not present

## 2015-11-08 DIAGNOSIS — Z7982 Long term (current) use of aspirin: Secondary | ICD-10-CM | POA: Diagnosis not present

## 2015-11-08 DIAGNOSIS — N402 Nodular prostate without lower urinary tract symptoms: Secondary | ICD-10-CM | POA: Insufficient documentation

## 2015-11-08 HISTORY — DX: Personal history of malignant neoplasm of bladder: Z85.51

## 2015-11-08 HISTORY — DX: Neoplasm of unspecified behavior of unspecified kidney: D49.519

## 2015-11-08 HISTORY — DX: Presence of spectacles and contact lenses: Z97.3

## 2015-11-08 HISTORY — PX: CYSTOSCOPY W/ RETROGRADES: SHX1426

## 2015-11-08 HISTORY — DX: Benign prostatic hyperplasia without lower urinary tract symptoms: N40.0

## 2015-11-08 HISTORY — DX: Testicular hypofunction: E29.1

## 2015-11-08 LAB — POCT HEMOGLOBIN-HEMACUE: HEMOGLOBIN: 14.4 g/dL (ref 13.0–17.0)

## 2015-11-08 SURGERY — CYSTOSCOPY, WITH RETROGRADE PYELOGRAM
Anesthesia: General | Site: Renal | Laterality: Left

## 2015-11-08 MED ORDER — DEXAMETHASONE SODIUM PHOSPHATE 10 MG/ML IJ SOLN
INTRAMUSCULAR | Status: AC
  Filled 2015-11-08: qty 1

## 2015-11-08 MED ORDER — PROMETHAZINE HCL 25 MG/ML IJ SOLN
6.2500 mg | INTRAMUSCULAR | Status: DC | PRN
  Filled 2015-11-08: qty 1

## 2015-11-08 MED ORDER — LACTATED RINGERS IV SOLN
INTRAVENOUS | Status: DC
  Filled 2015-11-08: qty 1000

## 2015-11-08 MED ORDER — MEPERIDINE HCL 25 MG/ML IJ SOLN
6.2500 mg | INTRAMUSCULAR | Status: DC | PRN
  Filled 2015-11-08: qty 1

## 2015-11-08 MED ORDER — CEFAZOLIN IN D5W 1 GM/50ML IV SOLN
1.0000 g | INTRAVENOUS | Status: DC
  Filled 2015-11-08: qty 50

## 2015-11-08 MED ORDER — EPHEDRINE 5 MG/ML INJ
INTRAVENOUS | Status: AC
  Filled 2015-11-08: qty 10

## 2015-11-08 MED ORDER — CEFAZOLIN SODIUM-DEXTROSE 2-4 GM/100ML-% IV SOLN
INTRAVENOUS | Status: AC
  Filled 2015-11-08: qty 100

## 2015-11-08 MED ORDER — ATROPINE SULFATE 0.4 MG/ML IJ SOLN
INTRAMUSCULAR | Status: AC
  Filled 2015-11-08: qty 1

## 2015-11-08 MED ORDER — EPHEDRINE SULFATE 50 MG/ML IJ SOLN
INTRAMUSCULAR | Status: DC | PRN
Start: 2015-11-08 — End: 2015-11-08
  Administered 2015-11-08 (×2): 10 mg via INTRAVENOUS

## 2015-11-08 MED ORDER — LIDOCAINE 2% (20 MG/ML) 5 ML SYRINGE
INTRAMUSCULAR | Status: AC
  Filled 2015-11-08: qty 5

## 2015-11-08 MED ORDER — SODIUM CHLORIDE 0.9 % IR SOLN
Status: DC | PRN
  Administered 2015-11-08: 1000 mL via INTRAVESICAL
  Administered 2015-11-08: 3000 mL via INTRAVESICAL

## 2015-11-08 MED ORDER — DEXAMETHASONE SODIUM PHOSPHATE 10 MG/ML IJ SOLN
INTRAMUSCULAR | Status: DC | PRN
  Administered 2015-11-08: 5 mg via INTRAVENOUS

## 2015-11-08 MED ORDER — PROPOFOL 10 MG/ML IV BOLUS
INTRAVENOUS | Status: AC
  Filled 2015-11-08: qty 20

## 2015-11-08 MED ORDER — ONDANSETRON HCL 4 MG/2ML IJ SOLN
INTRAMUSCULAR | Status: AC
  Filled 2015-11-08: qty 2

## 2015-11-08 MED ORDER — FENTANYL CITRATE (PF) 100 MCG/2ML IJ SOLN
INTRAMUSCULAR | Status: DC | PRN
  Administered 2015-11-08 (×6): 25 ug via INTRAVENOUS

## 2015-11-08 MED ORDER — LIDOCAINE HCL 2 % EX GEL
CUTANEOUS | Status: DC | PRN
  Administered 2015-11-08: 1 via URETHRAL

## 2015-11-08 MED ORDER — FENTANYL CITRATE (PF) 100 MCG/2ML IJ SOLN
INTRAMUSCULAR | Status: AC
  Filled 2015-11-08: qty 2

## 2015-11-08 MED ORDER — FENTANYL CITRATE (PF) 100 MCG/2ML IJ SOLN
25.0000 ug | INTRAMUSCULAR | Status: DC | PRN
  Filled 2015-11-08: qty 1

## 2015-11-08 MED ORDER — CEFAZOLIN SODIUM-DEXTROSE 2-4 GM/100ML-% IV SOLN
2.0000 g | INTRAVENOUS | Status: AC
  Administered 2015-11-08: 2 g via INTRAVENOUS
  Filled 2015-11-08: qty 100

## 2015-11-08 MED ORDER — ONDANSETRON HCL 4 MG/2ML IJ SOLN
INTRAMUSCULAR | Status: DC | PRN
Start: 2015-11-08 — End: 2015-11-08
  Administered 2015-11-08: 4 mg via INTRAVENOUS

## 2015-11-08 MED ORDER — IOHEXOL 300 MG/ML  SOLN
INTRAMUSCULAR | Status: DC | PRN
  Administered 2015-11-08: 26 mL

## 2015-11-08 MED ORDER — PROPOFOL 10 MG/ML IV BOLUS
INTRAVENOUS | Status: DC | PRN
  Administered 2015-11-08: 130 mg via INTRAVENOUS
  Administered 2015-11-08: 40 mg via INTRAVENOUS
  Administered 2015-11-08: 30 mg via INTRAVENOUS

## 2015-11-08 MED ORDER — LACTATED RINGERS IV SOLN
INTRAVENOUS | Status: DC
  Administered 2015-11-08 (×2): via INTRAVENOUS
  Filled 2015-11-08: qty 1000

## 2015-11-08 MED ORDER — LIDOCAINE HCL (CARDIAC) 20 MG/ML IV SOLN
INTRAVENOUS | Status: DC | PRN
  Administered 2015-11-08: 80 mg via INTRAVENOUS

## 2015-11-08 MED ORDER — ATROPINE SULFATE 0.4 MG/ML IJ SOLN
INTRAMUSCULAR | Status: DC | PRN
  Administered 2015-11-08: 0.4 mg via INTRAVENOUS

## 2015-11-08 SURGICAL SUPPLY — 21 items
BAG DRAIN URO-CYSTO SKYTR STRL (DRAIN) ×4 IMPLANT
BAG DRN UROCATH (DRAIN) ×2
CATH INTERMIT  6FR 70CM (CATHETERS) ×3 IMPLANT
CATH URET 5FR 28IN CONE TIP (BALLOONS) ×2
CATH URET 5FR 70CM CONE TIP (BALLOONS) ×1 IMPLANT
CLOTH BEACON ORANGE TIMEOUT ST (SAFETY) ×4 IMPLANT
GLOVE BIO SURGEON STRL SZ7.5 (GLOVE) ×7 IMPLANT
GOWN STRL REUS W/ TWL LRG LVL3 (GOWN DISPOSABLE) ×2 IMPLANT
GOWN STRL REUS W/ TWL XL LVL3 (GOWN DISPOSABLE) ×2 IMPLANT
GOWN STRL REUS W/TWL LRG LVL3 (GOWN DISPOSABLE) ×4
GOWN STRL REUS W/TWL XL LVL3 (GOWN DISPOSABLE) ×4
GUIDEWIRE ANG ZIPWIRE 038X150 (WIRE) ×3 IMPLANT
GUIDEWIRE STR DUAL SENSOR (WIRE) ×4 IMPLANT
IV NS 1000ML (IV SOLUTION) ×4
IV NS 1000ML BAXH (IV SOLUTION) ×1 IMPLANT
IV NS IRRIG 3000ML ARTHROMATIC (IV SOLUTION) ×5 IMPLANT
KIT ROOM TURNOVER WOR (KITS) ×4 IMPLANT
MANIFOLD NEPTUNE II (INSTRUMENTS) ×3 IMPLANT
PACK CYSTO (CUSTOM PROCEDURE TRAY) ×4 IMPLANT
STENT URET 6FRX28 CONTOUR (STENTS) ×3 IMPLANT
SYRINGE 10CC LL (SYRINGE) ×3 IMPLANT

## 2015-11-08 NOTE — Interval H&P Note (Signed)
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 After consideration of risks, benefits and other options for treatment, the patient has consented to  Procedure(s): CYSTOSCOPY WITH RETROGRADE PYELOGRAM, POSSIBLE URETEROSCOPY, BIOPSY (Left) HOLMIUM LASER APPLICATION (Left) as a surgical intervention .  The patient's history has been reviewed, patient examined, no change in status, stable for surgery.  I have reviewed the patient's chart and labs. I drew him a picture of the anatomy/CT findings. The CT was for a 9 mm exophytic right lesion that appears smaller on the current CT. I discussed with the patient the nature, potential benefits, risks and alternatives to the above, including side effects of the proposed treatment, the likelihood of the patient achieving the goals of the procedure, and any potential problems that might occur during the procedure or recuperation. All questions answered. Patient elects to proceed. Discussed he may need a staged procedure and / or pre-stent.      Alvon Nygaard

## 2015-11-08 NOTE — Anesthesia Postprocedure Evaluation (Signed)
Anesthesia Post Note  Patient: SCHNEUR SABER  Procedure(s) Performed: Procedure(s) (LRB): CYSTOSCOPY WITH RETROGRADE PYELOGRAM, URETEROSCOPY, STENT PLACEMENT (Left)  Patient location during evaluation: PACU Anesthesia Type: General Level of consciousness: awake and alert Pain management: pain level controlled Vital Signs Assessment: post-procedure vital signs reviewed and stable Respiratory status: spontaneous breathing, nonlabored ventilation, respiratory function stable and patient connected to nasal cannula oxygen Cardiovascular status: blood pressure returned to baseline and stable Postop Assessment: no signs of nausea or vomiting Anesthetic complications: no    Last Vitals:  Vitals:   11/08/15 1200 11/08/15 1247  BP:  (!) 166/82  Pulse: 83 84  Resp: 13 16  Temp:  36.6 C    Last Pain:  Vitals:   11/08/15 1247  TempSrc:   PainSc: 0-No pain                 Effie Berkshire

## 2015-11-08 NOTE — Transfer of Care (Signed)
Immediate Anesthesia Transfer of Care Note  Patient: Timothy Osborn  Procedure(s) Performed: Procedure(s) (LRB): CYSTOSCOPY WITH RETROGRADE PYELOGRAM, URETEROSCOPY, STENT PLACEMENT (Left)  Patient Location: PACU  Anesthesia Type: General  Level of Consciousness: awake, sedated, patient cooperative and responds to stimulation  Airway & Oxygen Therapy: Patient Spontanous Breathing and Patient connected to face mask oxygen  Post-op Assessment: Report given to PACU RN, Post -op Vital signs reviewed and stable and Patient moving all extremities  Post vital signs: Reviewed and stable  Complications: No apparent anesthesia complications

## 2015-11-08 NOTE — Anesthesia Procedure Notes (Signed)
Procedure Name: LMA Insertion Date/Time: 11/08/2015 10:12 AM Performed by: Justice Rocher Pre-anesthesia Checklist: Patient identified, Emergency Drugs available, Suction available and Patient being monitored Patient Re-evaluated:Patient Re-evaluated prior to inductionOxygen Delivery Method: Circle system utilized Preoxygenation: Pre-oxygenation with 100% oxygen Intubation Type: IV induction Ventilation: Mask ventilation without difficulty LMA: LMA inserted LMA Size: 5.0 Number of attempts: 1 Airway Equipment and Method: Bite block Placement Confirmation: positive ETCO2 Tube secured with: Tape Dental Injury: Teeth and Oropharynx as per pre-operative assessment  Comments: Teeth in poor condition. Dental risks explained preop with pt and wife. LMA placed without difficulty. Teeth as preop

## 2015-11-08 NOTE — Anesthesia Procedure Notes (Deleted)
Procedure Name: LMA Insertion Date/Time: 11/08/2015 10:12 AM Performed by: Justice Rocher Pre-anesthesia Checklist: Patient identified, Emergency Drugs available, Suction available and Patient being monitored Patient Re-evaluated:Patient Re-evaluated prior to inductionOxygen Delivery Method: Circle system utilized Preoxygenation: Pre-oxygenation with 100% oxygen Intubation Type: IV induction Ventilation: Mask ventilation without difficulty LMA: LMA inserted LMA Size: 5.0 Number of attempts: 1 Airway Equipment and Method: Bite block Placement Confirmation: positive ETCO2 Tube secured with: Tape Dental Injury: Teeth and Oropharynx as per pre-operative assessment

## 2015-11-08 NOTE — Op Note (Signed)
Preoperative diagnosis: Left renal neoplasm of uncertain behavior, left renal stone Postoperative diagnosis: Same  Procedure: Cystoscopy, left retrograde pyelogram, left ureteroscopy, left ureteral stent placement; Exam under anesthesia  Surgeon: Junious Silk  Anesthesia: General  Indications for procedure: Patient underwent CT scan for surveillance of a 9 mm exophytic right renal mass.  This lesion regressed on the current CT but the left lower pole infundibulum did not fill out and it appeared there could be possible neoplasm.  There was also a left lower pole stone.  I discussed with the patient the nature risks benefits and alternatives to ureteroscopy and elected to proceed.  Findings: Cystoscopy was unremarkable apart from some trabeculation of the bladder.  There were no mucosal lesions.  The trigone and ureteral orifices were in their normal orthotopic position.  The left UO was J-hook slightly from a trabeculated band of muscle.  Prostate was not obstructive.  Clear reflux was seen bilaterally.  Urethra appeared normal.  There was no stone or foreign body in the bladder.  Left retrograde pyelogram-this outlined a single ureter single collecting system unit.  I did not appreciate any filling defects, stricture or dilation.  The left lower pole infundibulum did fill out normally.  The stone in the LLP was noted on scout imaging.  On ureteroscopy the proximal ureter was narrow and the UPJ was only about 7 [place] in size and would not allow passage of the ureteroscope.  On exam under anesthesia the penis was circumcised and without mass or lesion.  On digital rectal exam prostate was small with a smooth nodule near the base and more pinpoint nodule near the apex.  All landmarks were preserved.  Description of procedure: After consent was obtained patient brought to the operating room.  After adequate anesthesia was placed in lithotomy position and prepped and draped in the usual sterile fashion.   A timeout was performed confirming patient and procedure.  The cystoscope was passed per urethra and I carefully examined the bladder with the 30 and 70 lens.  I could not cannulate the left ureteral orifice with a 6 [place] open-ended and was just able to do it with a cone tip.  I was able to get contrast up to the proximal ureter but no further. The ureter was slightly J-hook as it lay just behind the trabeculated band of muscle.  I was able to use a 6 [place] open-ended catheter and a glide angled Glidewire to get access.  The 6 [place] open-ended catheter was advanced to the mid ureter and the wire removed.  Second retrograde was performed which filled out the remainder of the system and this appeared normal.  I then passed the wire into the collecting system and removed the 6 French catheter.  I used an access sheath placed 2 wires and then replaced the access sheath adjacent to the Glidewire.  The access sheath was easily but the proximal ureter was narrow and I could not advance the dual lumen digital ureteroscope.  The single channel digital ureteroscope was advanced and even this was quite tight.  I was able to get it up to the UPJ but no further.  I passed a wire through the scope and tried the railroad over this into the renal pelvis but could not.  There was some mucosal erythema and edema developing at the UPJ and I thought it best to leave a stent.  The wire was backloaded on the cystoscope and a 6 x 28 cm stent was advanced.  The  wire was removed with a good coil seen recannulating the renal pelvis and a good coil in the bladder.  The bladder was drained and the scope removed.  Put some lidocaine jelly per urethra.  An exam under anesthesia was performed.  He was awakened and taken to the recovery room in stable condition.  Complications: None  Blood loss: Minimal  Specimens: None  Drains: 6 x 28 cm left ureteral stent  Will plan to bring back in 2-3 weeks for completion of URS.

## 2015-11-08 NOTE — H&P (View-Only) (Signed)
NPO AFTER MN.  ARRIVE AT [id number].  NEEDS HG AND EKG.  WILL TAKE PROZAC AND PRILOSEC AM DOS W/ SIPS OF WATER.

## 2015-11-08 NOTE — Brief Op Note (Signed)
11/08/2015  11:12 AM  PATIENT:  Timothy Osborn  82 y.o. male  PRE-OPERATIVE DIAGNOSIS:  LEFT RENAL PELVIS NEOPLASM AND LEFT RENAL STONE  POST-OPERATIVE DIAGNOSIS:  LEFT RENAL PELVIS NEOPLASM AND LEFT RENAL STONE  PROCEDURE:  Procedure(s): CYSTOSCOPY WITH RETROGRADE PYELOGRAM, URETEROSCOPY, STENT PLACEMENT (Left)  SURGEON:  Surgeon(s) and Role:    * Festus Aloe, MD - Primary  PHYSICIAN ASSISTANT:   ASSISTANTS: none   ANESTHESIA:   general  EBL:  Total I/O In: 1000 [I.V.:1000] Out: 5 [Blood:5]  BLOOD ADMINISTERED:none  DRAINS: 6x28 cm LEFT JJ stent   LOCAL MEDICATIONS USED:  LIDOCAINE jelly per urethra  SPECIMEN:  No Specimen  DISPOSITION OF SPECIMEN:  N/A  COUNTS:  YES  TOURNIQUET:  * No tourniquets in log *  DICTATION: .Dragon Dictation  PLAN OF CARE: Discharge to home after PACU  PATIENT DISPOSITION:  PACU - hemodynamically stable.   Delay start of Pharmacological VTE agent (>24hrs) due to surgical blood loss or risk of bleeding: not applicable

## 2015-11-08 NOTE — Discharge Instructions (Signed)
Post Anesthesia Home Care Instructions  Activity: Get plenty of rest for the remainder of the day. A responsible adult should stay with you for 24 hours following the procedure.  For the next 24 hours, DO NOT: -Drive a car -Paediatric nurse -Drink alcoholic beverages -Take any medication unless instructed by your physician -Make any legal decisions or sign important papers.  Meals: Start with liquid foods such as gelatin or soup. Progress to regular foods as tolerated. Avoid greasy, spicy, heavy foods. If nausea and/or vomiting occur, drink only clear liquids until the nausea and/or vomiting subsides. Call your physician if vomiting continues.  Special Instructions/Symptoms: Your throat may feel dry or sore from the anesthesia or the breathing tube placed in your throat during surgery. If this causes discomfort, gargle with warm salt water. The discomfort should disappear within 24 hours.  If you had a scopolamine patch placed behind your ear for the management of post- operative nausea and/or vomiting:  1. The medication in the patch is effective for 72 hours, after which it should be removed.  Wrap patch in a tissue and discard in the trash. Wash hands thoroughly with soap and water. 2. You may remove the patch earlier than 72 hours if you experience unpleasant side effects which may include dry mouth, dizziness or visual disturbances. 3. Avoid touching the patch. Wash your hands with soap and water after contact with the patch.   Ureteral Stent Implantation, Care After Refer to this sheet in the next few weeks. These instructions provide you with information on caring for yourself after your procedure. Your health care provider may also give you more specific instructions. Your treatment has been planned according to current medical practices, but problems sometimes occur. Call your health care provider if you have any problems or questions after your procedure. WHAT TO EXPECT AFTER  THE PROCEDURE You should be back to normal activity within 48 hours after the procedure. Nausea and vomiting may occur and are commonly the result of anesthesia. It is common to experience sharp pain in the back or lower abdomen and penis with voiding. This is caused by movement of the ends of the stent with the act of urinating.It usually goes away within minutes after you have stopped urinating. HOME CARE INSTRUCTIONS Make sure to drink plenty of fluids. You may have small amounts of bleeding, causing your urine to be red. This is normal. Certain movements may trigger pain or a feeling that you need to urinate. You may be given medicines to prevent infection or bladder spasms. Be sure to take all medicines as directed. Only take over-the-counter or prescription medicines for pain, discomfort, or fever as directed by your health care provider. Do not take aspirin, as this can make bleeding worse. Your stent will be left in until the blockage is resolved. This may take 2 weeks or longer, depending on the reason for stent implantation. You may have an X-ray exam to make sure your ureter is open and that the stent has not moved out of position (migrated). The stent can be removed by your health care provider in the office. Medicines may be given for comfort while the stent is being removed. Be sure to keep all follow-up appointments so your health care provider can check that you are healing properly. SEEK MEDICAL CARE IF:  You experience increasing pain.  Your pain medicine is not working. SEEK IMMEDIATE MEDICAL CARE IF:  Your urine is dark red or has blood clots.  You are leaking  urine (incontinent).  You have a fever, chills, feeling sick to your stomach (nausea), or vomiting.  Your pain is not relieved by pain medicine.  The end of the stent comes out of the urethra.  You are unable to urinate.   This information is not intended to replace advice given to you by your health care provider.  Make sure you discuss any questions you have with your health care provider.   Document Released: 09/17/2012 Document Revised: 01/20/2013 Document Reviewed: 07/30/2014 Elsevier Interactive Patient Education [employer].

## 2015-11-08 NOTE — Anesthesia Preprocedure Evaluation (Addendum)
Anesthesia Evaluation  Patient identified by MRN, date of birth, ID band Patient awake    Reviewed: Allergy & Precautions, NPO status , Patient's Chart, lab work & pertinent test results  Airway Mallampati: I       Dental  (+) Edentulous Upper, Poor Dentition, Dental Advisory Given   Pulmonary former smoker,    breath sounds clear to auscultation       Cardiovascular negative cardio ROS   Rhythm:Regular Rate:Normal     Neuro/Psych PSYCHIATRIC DISORDERS Anxiety Depression negative neurological ROS     GI/Hepatic Neg liver ROS, hiatal hernia, GERD  Medicated,  Endo/Other  negative endocrine ROS  Renal/GU   negative genitourinary   Musculoskeletal  (+) Arthritis , Osteoarthritis,    Abdominal   Peds negative pediatric ROS (+)  Hematology negative hematology ROS (+)   Anesthesia Other Findings   Reproductive/Obstetrics negative OB ROS                            Anesthesia Physical Anesthesia Plan  ASA: II  Anesthesia Plan: General   Post-op Pain Management:    Induction: Intravenous  Airway Management Planned: LMA  Additional Equipment:   Intra-op Plan:   Post-operative Plan: Extubation in OR  Informed Consent: I have reviewed the patients History and Physical, chart, labs and discussed the procedure including the risks, benefits and alternatives for the proposed anesthesia with the patient or authorized representative who has indicated his/her understanding and acceptance.   Dental advisory given  Plan Discussed with: CRNA  Anesthesia Plan Comments:         Anesthesia Quick Evaluation

## 2015-11-09 ENCOUNTER — Encounter (HOSPITAL_BASED_OUTPATIENT_CLINIC_OR_DEPARTMENT_OTHER): Payer: Self-pay | Admitting: Urology

## 2015-11-09 ENCOUNTER — Other Ambulatory Visit: Payer: Self-pay | Admitting: Urology

## 2015-11-29 ENCOUNTER — Encounter (HOSPITAL_BASED_OUTPATIENT_CLINIC_OR_DEPARTMENT_OTHER): Payer: Self-pay | Admitting: *Deleted

## 2015-11-29 NOTE — Progress Notes (Signed)
NPO AFTER MN.  ARRIVE AT 0600.  CURRENT HG AND EKG IN CHART AND EPIC.  WILL TAKE PROZAC AND PRILOSEC AM DOS W/ SIPS OF WATER.

## 2015-12-02 ENCOUNTER — Ambulatory Visit (HOSPITAL_BASED_OUTPATIENT_CLINIC_OR_DEPARTMENT_OTHER): Payer: PPO | Admitting: Anesthesiology

## 2015-12-02 ENCOUNTER — Encounter (HOSPITAL_BASED_OUTPATIENT_CLINIC_OR_DEPARTMENT_OTHER): Payer: Self-pay | Admitting: *Deleted

## 2015-12-02 ENCOUNTER — Ambulatory Visit (HOSPITAL_BASED_OUTPATIENT_CLINIC_OR_DEPARTMENT_OTHER)
Admission: RE | Admit: 2015-12-02 | Discharge: 2015-12-02 | Disposition: A | Discharge status: Home / Self Care | Payer: PPO | Source: Ambulatory Visit | Attending: Urology | Admitting: Urology

## 2015-12-02 ENCOUNTER — Encounter (HOSPITAL_BASED_OUTPATIENT_CLINIC_OR_DEPARTMENT_OTHER)
Admission: RE | Disposition: A | Discharge status: Home / Self Care | Payer: Self-pay | Source: Ambulatory Visit | Attending: Urology

## 2015-12-02 DIAGNOSIS — F419 Anxiety disorder, unspecified: Secondary | ICD-10-CM | POA: Diagnosis not present

## 2015-12-02 DIAGNOSIS — N400 Enlarged prostate without lower urinary tract symptoms: Secondary | ICD-10-CM | POA: Insufficient documentation

## 2015-12-02 DIAGNOSIS — M810 Age-related osteoporosis without current pathological fracture: Secondary | ICD-10-CM | POA: Diagnosis not present

## 2015-12-02 DIAGNOSIS — E291 Testicular hypofunction: Secondary | ICD-10-CM | POA: Diagnosis not present

## 2015-12-02 DIAGNOSIS — M48061 Spinal stenosis, lumbar region without neurogenic claudication: Secondary | ICD-10-CM | POA: Insufficient documentation

## 2015-12-02 DIAGNOSIS — F329 Major depressive disorder, single episode, unspecified: Secondary | ICD-10-CM | POA: Diagnosis not present

## 2015-12-02 DIAGNOSIS — Z8551 Personal history of malignant neoplasm of bladder: Secondary | ICD-10-CM | POA: Insufficient documentation

## 2015-12-02 DIAGNOSIS — Z885 Allergy status to narcotic agent status: Secondary | ICD-10-CM | POA: Diagnosis not present

## 2015-12-02 DIAGNOSIS — M8580 Other specified disorders of bone density and structure, unspecified site: Secondary | ICD-10-CM | POA: Diagnosis not present

## 2015-12-02 DIAGNOSIS — Z419 Encounter for procedure for purposes other than remedying health state, unspecified: Secondary | ICD-10-CM

## 2015-12-02 DIAGNOSIS — R896 Abnormal cytological findings in specimens from other organs, systems and tissues: Secondary | ICD-10-CM | POA: Diagnosis not present

## 2015-12-02 DIAGNOSIS — G8929 Other chronic pain: Secondary | ICD-10-CM | POA: Insufficient documentation

## 2015-12-02 DIAGNOSIS — Z87891 Personal history of nicotine dependence: Secondary | ICD-10-CM | POA: Diagnosis not present

## 2015-12-02 DIAGNOSIS — Z7982 Long term (current) use of aspirin: Secondary | ICD-10-CM | POA: Insufficient documentation

## 2015-12-02 DIAGNOSIS — Z9842 Cataract extraction status, left eye: Secondary | ICD-10-CM | POA: Diagnosis not present

## 2015-12-02 DIAGNOSIS — K219 Gastro-esophageal reflux disease without esophagitis: Secondary | ICD-10-CM | POA: Diagnosis not present

## 2015-12-02 DIAGNOSIS — Z961 Presence of intraocular lens: Secondary | ICD-10-CM | POA: Diagnosis not present

## 2015-12-02 DIAGNOSIS — N200 Calculus of kidney: Secondary | ICD-10-CM | POA: Insufficient documentation

## 2015-12-02 DIAGNOSIS — M1990 Unspecified osteoarthritis, unspecified site: Secondary | ICD-10-CM | POA: Diagnosis not present

## 2015-12-02 DIAGNOSIS — D49512 Neoplasm of unspecified behavior of left kidney: Secondary | ICD-10-CM | POA: Insufficient documentation

## 2015-12-02 DIAGNOSIS — Z9841 Cataract extraction status, right eye: Secondary | ICD-10-CM | POA: Insufficient documentation

## 2015-12-02 DIAGNOSIS — F429 Obsessive-compulsive disorder, unspecified: Secondary | ICD-10-CM | POA: Diagnosis not present

## 2015-12-02 DIAGNOSIS — Z96652 Presence of left artificial knee joint: Secondary | ICD-10-CM | POA: Insufficient documentation

## 2015-12-02 DIAGNOSIS — N2889 Other specified disorders of kidney and ureter: Secondary | ICD-10-CM | POA: Diagnosis not present

## 2015-12-02 DIAGNOSIS — Z888 Allergy status to other drugs, medicaments and biological substances status: Secondary | ICD-10-CM | POA: Insufficient documentation

## 2015-12-02 DIAGNOSIS — N3080 Other cystitis without hematuria: Secondary | ICD-10-CM | POA: Insufficient documentation

## 2015-12-02 DIAGNOSIS — Z466 Encounter for fitting and adjustment of urinary device: Secondary | ICD-10-CM | POA: Diagnosis not present

## 2015-12-02 HISTORY — PX: CYSTOSCOPY/URETEROSCOPY/HOLMIUM LASER/STENT PLACEMENT: SHX6546

## 2015-12-02 SURGERY — CYSTOSCOPY/URETEROSCOPY/HOLMIUM LASER/STENT PLACEMENT
Anesthesia: General

## 2015-12-02 MED ORDER — CEFAZOLIN SODIUM-DEXTROSE 2-4 GM/100ML-% IV SOLN
2.0000 g | INTRAVENOUS | Status: AC
  Administered 2015-12-02: 2 g via INTRAVENOUS
  Filled 2015-12-02: qty 100

## 2015-12-02 MED ORDER — LIDOCAINE 2% (20 MG/ML) 5 ML SYRINGE
INTRAMUSCULAR | Status: DC | PRN
  Administered 2015-12-02: 80 mg via INTRAVENOUS

## 2015-12-02 MED ORDER — PROMETHAZINE HCL 25 MG/ML IJ SOLN
6.2500 mg | INTRAMUSCULAR | Status: DC | PRN
  Filled 2015-12-02: qty 1

## 2015-12-02 MED ORDER — LACTATED RINGERS IV SOLN
INTRAVENOUS | Status: DC
  Administered 2015-12-02 (×2): via INTRAVENOUS
  Filled 2015-12-02: qty 1000

## 2015-12-02 MED ORDER — EPHEDRINE 5 MG/ML INJ
INTRAVENOUS | Status: AC
  Filled 2015-12-02: qty 10

## 2015-12-02 MED ORDER — CEFAZOLIN IN D5W 1 GM/50ML IV SOLN
1.0000 g | INTRAVENOUS | Status: DC
  Filled 2015-12-02: qty 50

## 2015-12-02 MED ORDER — ONDANSETRON HCL 4 MG/2ML IJ SOLN
INTRAMUSCULAR | Status: AC
  Filled 2015-12-02: qty 2

## 2015-12-02 MED ORDER — BELLADONNA ALKALOIDS-OPIUM 16.2-60 MG RE SUPP
RECTAL | Status: AC
  Filled 2015-12-02: qty 1

## 2015-12-02 MED ORDER — FENTANYL CITRATE (PF) 100 MCG/2ML IJ SOLN
INTRAMUSCULAR | Status: DC | PRN
  Administered 2015-12-02 (×4): 25 ug via INTRAVENOUS
  Administered 2015-12-02 (×2): 50 ug via INTRAVENOUS

## 2015-12-02 MED ORDER — LIDOCAINE 2% (20 MG/ML) 5 ML SYRINGE
INTRAMUSCULAR | Status: AC
  Filled 2015-12-02: qty 5

## 2015-12-02 MED ORDER — FENTANYL CITRATE (PF) 100 MCG/2ML IJ SOLN
25.0000 ug | INTRAMUSCULAR | Status: DC | PRN
  Filled 2015-12-02: qty 1

## 2015-12-02 MED ORDER — FENTANYL CITRATE (PF) 100 MCG/2ML IJ SOLN
INTRAMUSCULAR | Status: AC
  Filled 2015-12-02: qty 2

## 2015-12-02 MED ORDER — GLYCOPYRROLATE 0.2 MG/ML IJ SOLN
INTRAMUSCULAR | Status: DC | PRN
  Administered 2015-12-02: 0.1 mg via INTRAVENOUS

## 2015-12-02 MED ORDER — PROPOFOL 10 MG/ML IV BOLUS
INTRAVENOUS | Status: DC | PRN
  Administered 2015-12-02: 170 mg via INTRAVENOUS
  Administered 2015-12-02: 30 mg via INTRAVENOUS

## 2015-12-02 MED ORDER — DEXAMETHASONE SODIUM PHOSPHATE 10 MG/ML IJ SOLN
INTRAMUSCULAR | Status: AC
  Filled 2015-12-02: qty 1

## 2015-12-02 MED ORDER — LIDOCAINE HCL 2 % EX GEL
CUTANEOUS | Status: AC
  Filled 2015-12-02: qty 5

## 2015-12-02 MED ORDER — CEFAZOLIN SODIUM-DEXTROSE 2-4 GM/100ML-% IV SOLN
INTRAVENOUS | Status: AC
  Filled 2015-12-02: qty 100

## 2015-12-02 MED ORDER — EPHEDRINE SULFATE 50 MG/ML IJ SOLN
INTRAMUSCULAR | Status: DC | PRN
  Administered 2015-12-02 (×2): 10 mg via INTRAVENOUS

## 2015-12-02 MED ORDER — DEXAMETHASONE SODIUM PHOSPHATE 4 MG/ML IJ SOLN
INTRAMUSCULAR | Status: DC | PRN
  Administered 2015-12-02: 5 mg via INTRAVENOUS

## 2015-12-02 MED ORDER — SODIUM CHLORIDE 0.9 % IR SOLN
Status: DC | PRN
  Administered 2015-12-02: 1000 mL
  Administered 2015-12-02: 3000 mL

## 2015-12-02 MED ORDER — GLYCOPYRROLATE 0.2 MG/ML IV SOSY
PREFILLED_SYRINGE | INTRAVENOUS | Status: AC
  Filled 2015-12-02: qty 3

## 2015-12-02 MED ORDER — ONDANSETRON HCL 4 MG/2ML IJ SOLN
INTRAMUSCULAR | Status: DC | PRN
  Administered 2015-12-02: 4 mg via INTRAVENOUS

## 2015-12-02 MED ORDER — TRAMADOL HCL 50 MG PO TABS: 50.0000 mg | ORAL_TABLET | Freq: Four times a day (QID) | ORAL | 0 refills | Status: DC | PRN

## 2015-12-02 MED ORDER — PROPOFOL 10 MG/ML IV BOLUS
INTRAVENOUS | Status: AC
  Filled 2015-12-02: qty 20

## 2015-12-02 SURGICAL SUPPLY — 39 items
BAG DRAIN URO-CYSTO SKYTR STRL (DRAIN) ×3 IMPLANT
BAG DRN UROCATH (DRAIN) ×1
BASKET LASER NITINOL 1.9FR (BASKET) ×2 IMPLANT
BASKET STNLS GEMINI 4WIRE 3FR (BASKET) IMPLANT
BASKET ZERO TIP NITINOL 2.4FR (BASKET) IMPLANT
BSKT STON RTRVL 120 1.9FR (BASKET) ×1
BSKT STON RTRVL GEM 120X11 3FR (BASKET)
BSKT STON RTRVL ZERO TP 2.4FR (BASKET)
CATH INTERMIT  6FR 70CM (CATHETERS) ×2 IMPLANT
CATH URET 5FR 28IN CONE TIP (BALLOONS)
CATH URET 5FR 28IN OPEN ENDED (CATHETERS) IMPLANT
CATH URET 5FR 70CM CONE TIP (BALLOONS) IMPLANT
CATH URET DUAL LUMEN 6-10FR 50 (CATHETERS) IMPLANT
CLOTH BEACON ORANGE TIMEOUT ST (SAFETY) ×3 IMPLANT
ELECT REM PT RETURN 9FT ADLT (ELECTROSURGICAL)
ELECTRODE REM PT RTRN 9FT ADLT (ELECTROSURGICAL) IMPLANT
FIBER LASER LITHO 273 (Laser) ×2 IMPLANT
GLOVE BIO SURGEON STRL SZ7.5 (GLOVE) ×5 IMPLANT
GLOVE INDICATOR 7.5 STRL GRN (GLOVE) ×2 IMPLANT
GOWN STRL REUS W/ TWL LRG LVL3 (GOWN DISPOSABLE) ×1 IMPLANT
GOWN STRL REUS W/ TWL XL LVL3 (GOWN DISPOSABLE) ×1 IMPLANT
GOWN STRL REUS W/TWL LRG LVL3 (GOWN DISPOSABLE) ×3
GOWN STRL REUS W/TWL XL LVL3 (GOWN DISPOSABLE) ×3
GUIDEWIRE 0.038 PTFE COATED (WIRE) IMPLANT
GUIDEWIRE ANG ZIPWIRE 038X150 (WIRE) ×2 IMPLANT
GUIDEWIRE STR DUAL SENSOR (WIRE) ×3 IMPLANT
IV NS IRRIG 3000ML ARTHROMATIC (IV SOLUTION) ×4 IMPLANT
KIT BALLIN UROMAX 15FX10 (LABEL) IMPLANT
KIT BALLN UROMAX 15FX4 (MISCELLANEOUS) IMPLANT
KIT BALLN UROMAX 26 75X4 (MISCELLANEOUS)
KIT ROOM TURNOVER WOR (KITS) ×3 IMPLANT
MANIFOLD NEPTUNE II (INSTRUMENTS) ×2 IMPLANT
PACK CYSTO (CUSTOM PROCEDURE TRAY) ×3 IMPLANT
SET HIGH PRES BAL DIL (LABEL)
SHEATH ACCESS URETERAL 38CM (SHEATH) ×2 IMPLANT
STENT URET 6FRX28 CONTOUR (STENTS) ×2 IMPLANT
SYRINGE IRR TOOMEY STRL 70CC (SYRINGE) IMPLANT
TUBE CONNECTING 12'X1/4 (SUCTIONS) ×1
TUBE CONNECTING 12X1/4 (SUCTIONS) ×1 IMPLANT

## 2015-12-02 NOTE — OR Nursing (Signed)
In room on time delay in start of case due to surgeon had to re due H & P for a second time since it would not show up in the patients chart in Epic. Dr. Junious Silk came in the room and checked the chart.

## 2015-12-02 NOTE — Transfer of Care (Signed)
Immediate Anesthesia Transfer of Care Note  Patient: Timothy Osborn  Procedure(s) Performed: Procedure(s) (LRB): CYSTOSCOPY/URETEROSCOPY/HOLMIUM LASER/STENT PLACEMENT POSSIBLE BIOPSY (N/A)  Patient Location: PACU  Anesthesia Type: General  Level of Consciousness: awake, sedated, patient cooperative and responds to stimulation  Airway & Oxygen Therapy: Patient Spontanous Breathing and Patient connected to face mask oxygen  Post-op Assessment: Report given to PACU RN, Post -op Vital signs reviewed and stable and Patient moving all extremities  Post vital signs: Reviewed and stable  Complications: No apparent anesthesia complications

## 2015-12-02 NOTE — Anesthesia Postprocedure Evaluation (Signed)
Anesthesia Post Note  Patient: Timothy Osborn  Procedure(s) Performed: Procedure(s) (LRB): CYSTOSCOPY/URETEROSCOPY/HOLMIUM LASER/STENT PLACEMENT POSSIBLE BIOPSY (N/A)  Patient location during evaluation: PACU Anesthesia Type: General Level of consciousness: awake and alert Pain management: pain level controlled Vital Signs Assessment: post-procedure vital signs reviewed and stable Respiratory status: spontaneous breathing, nonlabored ventilation, respiratory function stable and patient connected to nasal cannula oxygen Cardiovascular status: blood pressure returned to baseline and stable Postop Assessment: no signs of nausea or vomiting Anesthetic complications: no    Last Vitals:  Vitals:   12/02/15 0943 12/02/15 0945  BP: (!) 160/95 (!) 160/95  Pulse: 93 95  Resp: 12 (!) 6  Temp: 36.7 C     Last Pain: There were no vitals filed for this visit.               Aamari Strawderman S

## 2015-12-02 NOTE — H&P (Signed)
Urology Admission H&P  Chief Complaint: left LP stone, left renal neoplasm (left LP infundibulum) of uncertain significance   History of Present Illness: F/u for staged left URS. Pt prestented about 2 weeks ago on the left. URS being done for a Sept 2017 CT showed narrowing of the left LP infundibulum, possible tumor. A 12 mm stone sits below this in the LP.   Pt has been well and tolerated the stent with minimal pain. No dysuria or fevers.   Past Medical History:  Diagnosis Date  . Anxiety   . Arthritis   . BPH (benign prostatic hyperplasia)   . Chronic back pain   . Depression   . GERD (gastroesophageal reflux disease)   . H/O hiatal hernia   . History of bladder cancer urologist-  dr Junious Silk   w/ recurrency  . History of retinal detachment PARTIAL DETACH-- NO SURGICAL INTERVENTION--  RESOLVED   RIGHT EYE 2008/   LEFT 2012  . Hyperlipidemia   . Hypogonadism male   . OCD (obsessive compulsive disorder)    "MILD  . Osteoporosis    /OSTEOPENIA  . Renal calculus, left   . Renal neoplasm    left renal pelvic  . Spinal stenosis of lumbar region   . Wears glasses    Past Surgical History:  Procedure Laterality Date  . CATARACT EXTRACTION W/ INTRAOCULAR LENS  IMPLANT, BILATERAL    . COLONOSCOPY    . CYSTOSCOPY W/ RETROGRADES Left 11/08/2015   Procedure: CYSTOSCOPY WITH RETROGRADE PYELOGRAM, URETEROSCOPY, STENT PLACEMENT;  Surgeon: Festus Aloe, MD;  Location[facility];  Service: Urology;  Laterality: Left;  . CYSTOSCOPY WITH BIOPSY N/A 03/13/2013   Procedure: CYSTOSCOPY WITH  BLADDER BIOPSY MITOMYCIN - C;  Surgeon: Fredricka Bonine, MD;  Location: [facility];  Service: Urology;  Laterality: N/A;  . INGUINAL HERNIA REPAIR Left   . LUMBAR LAMINECTOMY/DECOMPRESSION MICRODISCECTOMY  08/29/2011   Procedure: LUMBAR LAMINECTOMY/DECOMPRESSION MICRODISCECTOMY 2 LEVELS;  Surgeon: Eustace Moore, MD;  Location: [facility];  Service: Neurosurgery;   Laterality: N/A;  lumbar three-five lumbar laminectomy   . LUMBAR WOUND DEBRIDEMENT N/A 06/16/2012   Procedure: LUMBAR WOUND DEBRIDEMENT;  Surgeon: Eustace Moore, MD;  Location: [place];  Service: Neurosurgery;  Laterality: N/A;  Lumbar Wound Irrigation and debridment  . SHOULDER ARTHROSCOPY WITH ROTATOR CUFF REPAIR Right 07/2013  . TONSILLECTOMY  AS CHILD  . TOTAL KNEE ARTHROPLASTY  12/18/2010   Procedure: TOTAL KNEE ARTHROPLASTY;  Surgeon: Lorn Junes, MD;  Location: [facility];  Service: Orthopedics;  Laterality: Left;  ARTHROPLASTY KNEE TOTAL LEFT SIDE  . TRANSURETHRAL RESECTION OF BLADDER TUMOR N/A 05/06/2012   Procedure: TRANSURETHRAL RESECTION OF BLADDER TUMOR (TURBT) WITH INSTILATION OF MYTOMIACIN C;  Surgeon: Fredricka Bonine, MD;  Location: [facility];  Service: Urology;  Laterality: N/A;    Home Medications:  Prescriptions Prior to Admission  Medication Sig Dispense Refill Last Dose  . aspirin EC 81 MG tablet Take 81 mg by mouth daily with supper.   11/27/2015 at Unknown time  . b complex vitamins tablet Take 1 tablet by mouth daily with lunch.   Past Month at Unknown time  . Calcium Carbonate-Vitamin D (CALTRATE 600+D PO) Take 1 tablet by mouth 2 (two) times daily. At lunch and dinner   Past Month at Unknown time  . Coenzyme Q10 (COQ10) 200 MG CAPS Take 200 mg by mouth daily with lunch.   Past Month at Unknown time  .  ibuprofen (ADVIL,MOTRIN) 200 MG tablet Take 600 mg by mouth every 6 (six) hours as needed for pain (takes 3 (200mg ) tablets prn pain).   Past Month at Unknown time  . meloxicam (MOBIC) 15 MG tablet Take 15 mg by mouth every morning.   11/28/2015 at Unknown time  . Multiple Vitamin (MULTIVITAMIN WITH MINERALS) TABS Take 1 tablet by mouth daily with lunch. Centrum   Past Month at Unknown time  . omeprazole (PRILOSEC) 40 MG capsule Take 40 mg by mouth every morning.    12/02/2015 at 0500  . PARoxetine (PAXIL) 40 MG tablet Take 40 mg by mouth every  morning.    12/02/2015 at 0500  . Polyethyl Glycol-Propyl Glycol (SYSTANE OP) Apply 1 drop to eye daily as needed (for dry eyes).    11/30/2015 at Unknown time  . polyethylene glycol powder (MIRALAX) powder Take 1 Container by mouth as needed.   12/01/2015 at Unknown time  . senna (SENOKOT) 8.6 MG TABS tablet Take 1 tablet by mouth daily as needed for mild constipation.   Past Week at Unknown time  . simvastatin (ZOCOR) 20 MG tablet Take 20 mg by mouth daily with supper.    12/01/2015 at 1900   Allergies:  Allergies  Allergen Reactions  . Prozac [Fluoxetine Hcl] Other (See Comments)    "Jittery"  . Vicodin [Hydrocodone-Acetaminophen] Other (See Comments)    hallucinations    Family History  Problem Relation Age of Onset  . Heart failure Father   . Heart attack Brother    Social History:  reports that he quit smoking about 29 years ago. His smoking use included Pipe. He has a 8.00 pack-year smoking history. He quit smokeless tobacco use about 29 years ago. He reports that he drinks alcohol. He reports that he does not use drugs.  Review of Systems  All other systems reviewed and are negative.   Physical Exam:  Vital signs in last 24 hours:   Physical Exam  Constitutional: He appears well-developed and well-nourished.  HENT:  Head: Normocephalic.  Eyes: Conjunctivae and EOM are normal.  Neck: Normal range of motion.  Cardiovascular: Regular rhythm.   Respiratory: Effort normal.  Musculoskeletal: Normal range of motion.  Neurological: He is alert.  Skin: Skin is warm and dry.  Psychiatric: He has a normal mood and affect.    Laboratory Data:  No results found for this or any previous visit (from the past 24 hour(s)). No results found for this or any previous visit (from the past 240 hour(s)). Creatinine: No results for input(s): CREATININE in the last 168 hours. Baseline Creatinine:   Impression/Assessment:   left LP stone, left renal neoplasm (left LP infundibulum) of  uncertain significance  Plan:  Discussed again with pt and wife nature r/b/a to left URS poss bx poss HLL and stent discussed. If LP infundibulum obstructed may not be able to treat stone. Discussed risks such as bleeding, ureteral or renal injury among others. All questions answered. They elect to proceed.   Brain Honeycutt 12/02/2015, 7:29 AM

## 2015-12-02 NOTE — Op Note (Addendum)
Preoperative diagnosis: Left lower pole infundibulum neoplasm of uncertain significance, left lower pole stone  Postoperative diagnosis: Same  Procedure: Cystoscopy, left ureteroscopy, holmium laser lithotripsy, renal pelvis/infundibular biopsy and brush biopsy  Surgeon: Junious Silk  Anesthesia: Gen.  Indication for procedure: 82 year old with history of bladder cancer with an infundibular possible stenosis or neoplasm on CT he was brought for stage ureteroscopy and treatment of lower pole stone.   Findings: On ureteroscopy the renal pelvis had some cystitis cystica changes most likely from the stent coil the infundibula and calyces of the upper middle and lower pole all appeared clear. There was no suspicion of neoplasm or CIS. There were no erythematous areas. There was some submucosal hemorrhage but this appeared to develop after the calyces were distended. The infundibulum going to the left lower pole was a single unit with multiple calyces and shorter infundibulum at the base. It was easy to examine. There was a large stone in the left lower pole. It was quite dense.  Description of procedure: After consent was obtained patient brought to the operating room. After adequate anesthesia he was placed in lithotomy position and prepped and draped in the usual sterile fashion. A timeout was performed to confirm the patient and procedure. The cystoscope was passed per urethra and the left ureteral stent grasped and removed through the urethral meatus. A Glidewire was advanced through the stent and then I used a ureteral access sheath to place 2 wires. Next the sheath was placed back beside the Glidewire leaving it as a safety and ureteroscope was advanced. The proximal ureter renal pelvis and calyces were carefully inspected. No worrisome findings noted. I sent washing for cytology.   I approached the stone with the 273  laser fiber and 0.6 and 12 and 0.8 and 8 was able to fragment the stone into very  small pieces. The largest remaining piece was grasped and pulled up into the upper pole for ease of lasering and finish there. The upper and lower poles were cleared of any fragments. The stone was easily noted as well as some of the fragments on fluoroscopy scout imaging and after removal of these fragments I noted no other opacities on fluoroscopy. On inspection there were no significant fragments remaining.   To be complete I did biopsy the renal pelvis and infundibulum and then brush the infundibulum and brought it into the renal pelvis. A believe the changes noted were likely from the stent. The collecting system renal pelvis proximal ureter were inspected and noted to be normal. Hemostasis was good. The biopsies were shallow without   any obvious perforation. the ureteral access sheath was withdrawn with the ureteroscope and the ureter inspected and noted to be normal. Because of multiple passes with the biopsy and the stone fragment extraction I decided to leave a stent without string. The Glidewire was backloaded on the cystoscope and a 6 x 27 m stent was advanced. The wire was removed with a good coil seen in the kidney and a good coil in the bladder. The bladder was drained and the scope removed. The patient was awakened and taken to the recovery room in stable condition.   Complications: None   Blood loss: Minimal   Specimens:  #1 left renal pelvis wash for cytology (via URS - would not be surprised if clusters of cells noted from the URS and wire passage).  #2 biopsy and brush biopsy of the renal pelvis and lower pole infundibula to pathology  #3 stone fragments to office  lab   Drains: 6 x 28 cm left ureteral stent

## 2015-12-02 NOTE — Anesthesia Procedure Notes (Signed)
Procedure Name: LMA Insertion Date/Time: 12/02/2015 7:37 AM Performed by: Justice Rocher Pre-anesthesia Checklist: Patient identified, Emergency Drugs available, Suction available and Patient being monitored Patient Re-evaluated:Patient Re-evaluated prior to inductionOxygen Delivery Method: Circle system utilized Preoxygenation: Pre-oxygenation with 100% oxygen Intubation Type: IV induction Ventilation: Mask ventilation without difficulty LMA: LMA inserted LMA Size: 5.0 Number of attempts: 1 Airway Equipment and Method: Bite block Placement Confirmation: positive ETCO2 and breath sounds checked- equal and bilateral Tube secured with: Tape Dental Injury: Teeth and Oropharynx as per pre-operative assessment

## 2015-12-02 NOTE — Discharge Instructions (Signed)
Ureteral Stent Implantation, Care After Refer to this sheet in the next few weeks. These instructions provide you with information on caring for yourself after your procedure. Your health care provider may also give you more specific instructions. Your treatment has been planned according to current medical practices, but problems sometimes occur. Call your health care provider if you have any problems or questions after your procedure. WHAT TO EXPECT AFTER THE PROCEDURE You should be back to normal activity within 48 hours after the procedure. Nausea and vomiting may occur and are commonly the result of anesthesia. It is common to experience sharp pain in the back or lower abdomen and penis with voiding. This is caused by movement of the ends of the stent with the act of urinating.It usually goes away within minutes after you have stopped urinating. HOME CARE INSTRUCTIONS Make sure to drink plenty of fluids. You may have small amounts of bleeding, causing your urine to be red. This is normal. Certain movements may trigger pain or a feeling that you need to urinate. You may be given medicines to prevent infection or bladder spasms. Be sure to take all medicines as directed. Only take over-the-counter or prescription medicines for pain, discomfort, or fever as directed by your health care provider. Do not take aspirin, as this can make bleeding worse.  Your stent will be left in until the blockage is resolved. The stent will be removed by your health care provider in the office. Be sure to keep all follow-up appointments so your health care provider can check that you are healing properly.  SEEK MEDICAL CARE IF:  You experience increasing pain.  Your pain medicine is not working. SEEK IMMEDIATE MEDICAL CARE IF:  Your urine is dark red or has blood clots.  You are leaking urine (incontinent).  You have a fever, chills, feeling sick to your stomach (nausea), or vomiting.  Your pain is not  relieved by pain medicine.  The end of the stent comes out of the urethra.  You are unable to urinate.   This information is not intended to replace advice given to you by your health care provider. Make sure you discuss any questions you have with your health care provider.   Document Released: 09/17/2012 Document Revised: 01/20/2013 Document Reviewed: 07/30/2014 Elsevier Interactive Patient Education 2016 [place] [facility] Home Care Instructions  Activity: Get plenty of rest for the remainder of the day. A responsible adult should stay with you for 24 hours following the procedure.  For the next 24 hours, DO NOT: -Drive a car -Paediatric nurse -Drink alcoholic beverages -Take any medication unless instructed by your physician -Make any legal decisions or sign important papers.  Meals: Start with liquid foods such as gelatin or soup. Progress to regular foods as tolerated. Avoid greasy, spicy, heavy foods. If nausea and/or vomiting occur, drink only clear liquids until the nausea and/or vomiting subsides. Call your physician if vomiting continues.  Special Instructions/Symptoms: Your throat may feel dry or sore from the anesthesia or the breathing tube placed in your throat during surgery. If this causes discomfort, gargle with warm salt water. The discomfort should disappear within 24 hours.  If you had a scopolamine patch placed behind your ear for the management of post- operative nausea and/or vomiting:  1. The medication in the patch is effective for 72 hours, after which it should be removed.  Wrap patch in a tissue and discard in the trash. Wash hands thoroughly with soap and water.  2. You may remove the patch earlier than 72 hours if you experience unpleasant side effects which may include dry mouth, dizziness or visual disturbances. °3. Avoid touching the patch. Wash your hands with soap and water after contact with the patch. °  ° °

## 2015-12-02 NOTE — H&P (Signed)
H&P  Chief Complaint: left LP infundibulum neoplasm uncertain significance, Left LP stone   History of Present Illness: Pt brought for staged URS due to a left LP infundibulum neoplasm uncertain significance, Left LP stone seen on CT Sep 2017. H/o superficial bladder ca. He was stented about 2 weeks ago.   He has tolerated stent well. No dysuria or fever.   Past Medical History:  Diagnosis Date  . Anxiety   . Arthritis   . BPH (benign prostatic hyperplasia)   . Chronic back pain   . Depression   . GERD (gastroesophageal reflux disease)   . H/O hiatal hernia   . History of bladder cancer urologist-  dr Junious Silk   w/ recurrency  . History of retinal detachment PARTIAL DETACH-- NO SURGICAL INTERVENTION--  RESOLVED   RIGHT EYE 2008/   LEFT 2012  . Hyperlipidemia   . Hypogonadism male   . OCD (obsessive compulsive disorder)    "MILD  . Osteoporosis    /OSTEOPENIA  . Renal calculus, left   . Renal neoplasm    left renal pelvic  . Spinal stenosis of lumbar region   . Wears glasses    Past Surgical History:  Procedure Laterality Date  . CATARACT EXTRACTION W/ INTRAOCULAR LENS  IMPLANT, BILATERAL    . COLONOSCOPY    . CYSTOSCOPY W/ RETROGRADES Left 11/08/2015   Procedure: CYSTOSCOPY WITH RETROGRADE PYELOGRAM, URETEROSCOPY, STENT PLACEMENT;  Surgeon: Festus Aloe, MD;  Location: [facility];  Service: Urology;  Laterality: Left;  . CYSTOSCOPY WITH BIOPSY N/A 03/13/2013   Procedure: CYSTOSCOPY WITH  BLADDER BIOPSY MITOMYCIN - C;  Surgeon: Fredricka Bonine, MD;  [facility];  Service: Urology;  Laterality: N/A;  . INGUINAL HERNIA REPAIR Left   . LUMBAR LAMINECTOMY/DECOMPRESSION MICRODISCECTOMY  08/29/2011   Procedure: LUMBAR LAMINECTOMY/DECOMPRESSION MICRODISCECTOMY 2 LEVELS;  Surgeon: Eustace Moore, MD;  Location: [place] [facility];  Service: Neurosurgery;  Laterality: N/A;  lumbar three-five lumbar laminectomy   . LUMBAR WOUND  DEBRIDEMENT N/A 06/16/2012   Procedure: LUMBAR WOUND DEBRIDEMENT;  Surgeon: Eustace Moore, MD;  Location: [place] [facility];  Service: Neurosurgery;  Laterality: N/A;  Lumbar Wound Irrigation and debridment  . SHOULDER ARTHROSCOPY WITH ROTATOR CUFF REPAIR Right 07/2013  . TONSILLECTOMY  AS CHILD  . TOTAL KNEE ARTHROPLASTY  12/18/2010   Procedure: TOTAL KNEE ARTHROPLASTY;  Surgeon: Lorn Junes, MD;  Location: [facility];  Service: Orthopedics;  Laterality: Left;  ARTHROPLASTY KNEE TOTAL LEFT SIDE  . TRANSURETHRAL RESECTION OF BLADDER TUMOR N/A 05/06/2012   Procedure: TRANSURETHRAL RESECTION OF BLADDER TUMOR (TURBT) WITH INSTILATION OF MYTOMIACIN C;  Surgeon: Fredricka Bonine, MD;  Location: [facility];  Service: Urology;  Laterality: N/A;    Home Medications:  Prescriptions Prior to Admission  Medication Sig Dispense Refill Last Dose  . aspirin EC 81 MG tablet Take 81 mg by mouth daily with supper.   11/27/2015 at Unknown time  . b complex vitamins tablet Take 1 tablet by mouth daily with lunch.   Past Month at Unknown time  . Calcium Carbonate-Vitamin D (CALTRATE 600+D PO) Take 1 tablet by mouth 2 (two) times daily. At lunch and dinner   Past Month at Unknown time  . Coenzyme Q10 (COQ10) 200 MG CAPS Take 200 mg by mouth daily with lunch.   Past Month at Unknown time  . ibuprofen (ADVIL,MOTRIN) 200 MG tablet Take 600 mg by mouth every 6 (six) hours as needed for pain (  takes 3 (200mg ) tablets prn pain).   Past Month at Unknown time  . meloxicam (MOBIC) 15 MG tablet Take 15 mg by mouth every morning.   11/28/2015 at Unknown time  . Multiple Vitamin (MULTIVITAMIN WITH MINERALS) TABS Take 1 tablet by mouth daily with lunch. Centrum   Past Month at Unknown time  . omeprazole (PRILOSEC) 40 MG capsule Take 40 mg by mouth every morning.    12/02/2015 at 0500  . PARoxetine (PAXIL) 40 MG tablet Take 40 mg by mouth every morning.    12/02/2015 at 0500  . Polyethyl Glycol-Propyl Glycol (SYSTANE  OP) Apply 1 drop to eye daily as needed (for dry eyes).    11/30/2015 at Unknown time  . polyethylene glycol powder (MIRALAX) powder Take 1 Container by mouth as needed.   12/01/2015 at Unknown time  . senna (SENOKOT) 8.6 MG TABS tablet Take 1 tablet by mouth daily as needed for mild constipation.   Past Week at Unknown time  . simvastatin (ZOCOR) 20 MG tablet Take 20 mg by mouth daily with supper.    12/01/2015 at 1900   Allergies:  Allergies  Allergen Reactions  . Prozac [Fluoxetine Hcl] Other (See Comments)    "Jittery"  . Vicodin [Hydrocodone-Acetaminophen] Other (See Comments)    hallucinations    Family History  Problem Relation Age of Onset  . Heart failure Father   . Heart attack Brother    Social History:  reports that he quit smoking about 29 years ago. His smoking use included Pipe. He has a 8.00 pack-year smoking history. He quit smokeless tobacco use about 29 years ago. He reports that he drinks alcohol. He reports that he does not use drugs.  ROS: A complete review of systems was performed.  All systems are negative except for pertinent findings as noted. Review of Systems  All other systems reviewed and are negative.    Physical Exam:  Vital signs in last 24 hours: Temp:  [97.7 F (36.5 C)] 97.7 F (36.5 C) (11/03 0623) Pulse Rate:  [67] 67 (11/03 0623) Resp:  [20] 20 (11/03 0623) BP: (150)/(83) 150/83 (11/03 0623) SpO2:  [97 %] 97 % (11/03 0623) Weight:  [87.2 kg (192 lb 5 oz)] 87.2 kg (192 lb 5 oz) (11/03 [id number]) General:  Alert and oriented, No acute distress HEENT: Normocephalic, atraumatic Neck: No JVD or lymphadenopathy Cardiovascular: Regular rate and rhythm Lungs: Regular rate and effort Extremities: No edema Neurologic: Grossly intact  Laboratory Data:  No results found for this or any previous visit (from the past 24 hour(s)). No results found for this or any previous visit (from the past 240 hour(s)). Creatinine: No results for input(s):  CREATININE in the last 168 hours.  Impression/Assessment:   left LP infundibulum neoplasm uncertain significance, Left LP stone   Plan:  Discussed again with patient and wife the nature, r/b/a to left URS poss bx poss HLL. We discussed again risk of staged procedure, ureteral or renal injury, bleeding among others. All questions answered. They elect to proceed.   Isidora Laham 12/02/2015, 7:53 AM

## 2015-12-02 NOTE — Anesthesia Preprocedure Evaluation (Signed)
Anesthesia Evaluation  Patient identified by MRN, date of birth, ID band Patient awake    Reviewed: Allergy & Precautions, NPO status , Patient's Chart, lab work & pertinent test results  Airway Mallampati: II  TM Distance: >3 FB Neck ROM: Full    Dental no notable dental hx.    Pulmonary neg pulmonary ROS, former smoker,    Pulmonary exam normal breath sounds clear to auscultation       Cardiovascular negative cardio ROS Normal cardiovascular exam Rhythm:Regular Rate:Normal     Neuro/Psych negative neurological ROS  negative psych ROS   GI/Hepatic Neg liver ROS, GERD  ,  Endo/Other  negative endocrine ROS  Renal/GU negative Renal ROS  negative genitourinary   Musculoskeletal negative musculoskeletal ROS (+)   Abdominal   Peds negative pediatric ROS (+)  Hematology negative hematology ROS (+)   Anesthesia Other Findings   Reproductive/Obstetrics negative OB ROS                             Anesthesia Physical Anesthesia Plan  ASA: II  Anesthesia Plan: General   Post-op Pain Management:    Induction: Intravenous  Airway Management Planned: LMA  Additional Equipment:   Intra-op Plan:   Post-operative Plan: Extubation in OR  Informed Consent: I have reviewed the patients History and Physical, chart, labs and discussed the procedure including the risks, benefits and alternatives for the proposed anesthesia with the patient or authorized representative who has indicated his/her understanding and acceptance.   Dental advisory given  Plan Discussed with: CRNA and Surgeon  Anesthesia Plan Comments:         Anesthesia Quick Evaluation

## 2015-12-05 ENCOUNTER — Encounter (HOSPITAL_BASED_OUTPATIENT_CLINIC_OR_DEPARTMENT_OTHER): Payer: Self-pay | Admitting: Urology

## 2015-12-16 DIAGNOSIS — N200 Calculus of kidney: Secondary | ICD-10-CM | POA: Diagnosis not present

## 2015-12-16 DIAGNOSIS — N402 Nodular prostate without lower urinary tract symptoms: Secondary | ICD-10-CM | POA: Diagnosis not present

## 2015-12-16 DIAGNOSIS — C672 Malignant neoplasm of lateral wall of bladder: Secondary | ICD-10-CM | POA: Diagnosis not present

## 2015-12-16 DIAGNOSIS — D49519 Neoplasm of unspecified behavior of unspecified kidney: Secondary | ICD-10-CM | POA: Diagnosis not present

## 2016-03-12 DIAGNOSIS — N402 Nodular prostate without lower urinary tract symptoms: Secondary | ICD-10-CM | POA: Diagnosis not present

## 2016-03-12 DIAGNOSIS — D3000 Benign neoplasm of unspecified kidney: Secondary | ICD-10-CM | POA: Diagnosis not present

## 2016-03-12 DIAGNOSIS — C672 Malignant neoplasm of lateral wall of bladder: Secondary | ICD-10-CM | POA: Diagnosis not present

## 2016-05-14 DIAGNOSIS — Z0000 Encounter for general adult medical examination without abnormal findings: Secondary | ICD-10-CM | POA: Diagnosis not present

## 2016-05-14 DIAGNOSIS — M810 Age-related osteoporosis without current pathological fracture: Secondary | ICD-10-CM | POA: Diagnosis not present

## 2016-05-14 DIAGNOSIS — Z7982 Long term (current) use of aspirin: Secondary | ICD-10-CM | POA: Diagnosis not present

## 2016-05-14 DIAGNOSIS — Z1321 Encounter for screening for nutritional disorder: Secondary | ICD-10-CM | POA: Diagnosis not present

## 2016-05-14 DIAGNOSIS — E7800 Pure hypercholesterolemia, unspecified: Secondary | ICD-10-CM | POA: Diagnosis not present

## 2016-05-17 DIAGNOSIS — E7800 Pure hypercholesterolemia, unspecified: Secondary | ICD-10-CM | POA: Diagnosis not present

## 2016-05-17 DIAGNOSIS — N200 Calculus of kidney: Secondary | ICD-10-CM | POA: Diagnosis not present

## 2016-05-17 DIAGNOSIS — Z0001 Encounter for general adult medical examination with abnormal findings: Secondary | ICD-10-CM | POA: Diagnosis not present

## 2016-05-17 DIAGNOSIS — M810 Age-related osteoporosis without current pathological fracture: Secondary | ICD-10-CM | POA: Diagnosis not present

## 2016-05-24 DIAGNOSIS — I10 Essential (primary) hypertension: Secondary | ICD-10-CM | POA: Diagnosis not present

## 2016-07-05 DIAGNOSIS — I10 Essential (primary) hypertension: Secondary | ICD-10-CM | POA: Diagnosis not present

## 2016-07-10 DIAGNOSIS — M7522 Bicipital tendinitis, left shoulder: Secondary | ICD-10-CM | POA: Diagnosis not present

## 2016-07-10 DIAGNOSIS — M25511 Pain in right shoulder: Secondary | ICD-10-CM | POA: Diagnosis not present

## 2016-07-16 DIAGNOSIS — M25511 Pain in right shoulder: Secondary | ICD-10-CM | POA: Diagnosis not present

## 2016-07-16 DIAGNOSIS — R531 Weakness: Secondary | ICD-10-CM | POA: Diagnosis not present

## 2016-09-03 DIAGNOSIS — N402 Nodular prostate without lower urinary tract symptoms: Secondary | ICD-10-CM | POA: Diagnosis not present

## 2016-09-03 DIAGNOSIS — C672 Malignant neoplasm of lateral wall of bladder: Secondary | ICD-10-CM | POA: Diagnosis not present

## 2016-09-10 DIAGNOSIS — Z8551 Personal history of malignant neoplasm of bladder: Secondary | ICD-10-CM | POA: Diagnosis not present

## 2016-09-10 DIAGNOSIS — N2884 Pyelitis cystica: Secondary | ICD-10-CM | POA: Diagnosis not present

## 2016-09-10 DIAGNOSIS — C672 Malignant neoplasm of lateral wall of bladder: Secondary | ICD-10-CM | POA: Diagnosis not present

## 2016-09-14 DIAGNOSIS — C672 Malignant neoplasm of lateral wall of bladder: Secondary | ICD-10-CM | POA: Diagnosis not present

## 2016-09-14 DIAGNOSIS — N281 Cyst of kidney, acquired: Secondary | ICD-10-CM | POA: Diagnosis not present

## 2016-09-14 DIAGNOSIS — Z8551 Personal history of malignant neoplasm of bladder: Secondary | ICD-10-CM | POA: Diagnosis not present

## 2016-10-04 DIAGNOSIS — R432 Parageusia: Secondary | ICD-10-CM | POA: Diagnosis not present

## 2016-10-04 DIAGNOSIS — I10 Essential (primary) hypertension: Secondary | ICD-10-CM | POA: Diagnosis not present

## 2016-10-04 DIAGNOSIS — G4700 Insomnia, unspecified: Secondary | ICD-10-CM | POA: Diagnosis not present

## 2016-10-04 DIAGNOSIS — K140 Glossitis: Secondary | ICD-10-CM | POA: Diagnosis not present

## 2016-11-15 DIAGNOSIS — R432 Parageusia: Secondary | ICD-10-CM | POA: Diagnosis not present

## 2016-11-15 DIAGNOSIS — Z23 Encounter for immunization: Secondary | ICD-10-CM | POA: Diagnosis not present

## 2016-11-15 DIAGNOSIS — G4700 Insomnia, unspecified: Secondary | ICD-10-CM | POA: Diagnosis not present

## 2016-12-26 DIAGNOSIS — M25511 Pain in right shoulder: Secondary | ICD-10-CM | POA: Diagnosis not present

## 2016-12-26 DIAGNOSIS — G8929 Other chronic pain: Secondary | ICD-10-CM | POA: Diagnosis not present

## 2017-01-04 DIAGNOSIS — M19019 Primary osteoarthritis, unspecified shoulder: Secondary | ICD-10-CM | POA: Diagnosis not present

## 2017-01-04 DIAGNOSIS — M25511 Pain in right shoulder: Secondary | ICD-10-CM | POA: Diagnosis not present

## 2017-02-15 ENCOUNTER — Inpatient Hospital Stay (HOSPITAL_COMMUNITY): Admit: 2017-02-15 | Payer: PPO | Admitting: Orthopedic Surgery

## 2017-02-15 ENCOUNTER — Encounter (HOSPITAL_COMMUNITY): Payer: Self-pay

## 2017-02-15 SURGERY — ARTHROPLASTY, SHOULDER, TOTAL, REVERSE
Anesthesia: Choice | Site: Shoulder | Laterality: Right

## 2017-02-28 NOTE — H&P (Signed)
Timothy Osborn is an 84 y.o. male.    Chief Complaint: right shoulder pain  HPI: Pt is a 84 y.o. male complaining of right shoulder pain for multiple years. Pain had continually increased since the beginning. X-rays in the clinic show end-stage arthritic changes of the right shoulder. Pt has tried various conservative treatments which have failed to alleviate their symptoms, including injections and therapy. Various options are discussed with the patient. Risks, benefits and expectations were discussed with the patient. Patient understand the risks, benefits and expectations and wishes to proceed with surgery.   PCP:  Timothy Pretty, MD  D/C Plans: Home  PMH: Past Medical History:  Diagnosis Date  . Anxiety   . Arthritis   . BPH (benign prostatic hyperplasia)   . Chronic back pain   . Depression   . GERD (gastroesophageal reflux disease)   . H/O hiatal hernia   . History of bladder cancer urologist-  dr Junious Osborn   w/ recurrency  . History of retinal detachment PARTIAL DETACH-- NO SURGICAL INTERVENTION--  RESOLVED   RIGHT EYE 2008/   LEFT 2012  . Hyperlipidemia   . Hypogonadism male   . OCD (obsessive compulsive disorder)    "MILD  . Osteoporosis    /OSTEOPENIA  . Renal calculus, left   . Renal neoplasm    left renal pelvic  . Spinal stenosis of lumbar region   . Wears glasses     PSH: Past Surgical History:  Procedure Laterality Date  . CATARACT EXTRACTION W/ INTRAOCULAR LENS  IMPLANT, BILATERAL    . COLONOSCOPY    . CYSTOSCOPY W/ RETROGRADES Left 11/08/2015   Procedure: CYSTOSCOPY WITH RETROGRADE PYELOGRAM, URETEROSCOPY, STENT PLACEMENT;  Surgeon: Festus Aloe, MD;  Location[facility];  Service: Urology;  Laterality: Left;  . CYSTOSCOPY WITH BIOPSY N/A 03/13/2013   Procedure: CYSTOSCOPY WITH  BLADDER BIOPSY MITOMYCIN - C;  Surgeon: Fredricka Bonine, MD;  Location[facility];  Service: Urology;  Laterality: N/A;  .  CYSTOSCOPY/URETEROSCOPY/HOLMIUM LASER/STENT PLACEMENT N/A 12/02/2015   Procedure: CYSTOSCOPY/URETEROSCOPY/HOLMIUM LASER/STENT PLACEMENT POSSIBLE BIOPSY;  Surgeon: Festus Aloe, MD;  Location: [facility];  Service: Urology;  Laterality: N/A;  . INGUINAL HERNIA REPAIR Left   . LUMBAR LAMINECTOMY/DECOMPRESSION MICRODISCECTOMY  08/29/2011   Procedure: LUMBAR LAMINECTOMY/DECOMPRESSION MICRODISCECTOMY 2 LEVELS;  Surgeon: Eustace Moore, MD;  Location: [place] [facility];  Service: Neurosurgery;  Laterality: N/A;  lumbar three-five lumbar laminectomy   . LUMBAR WOUND DEBRIDEMENT N/A 06/16/2012   Procedure: LUMBAR WOUND DEBRIDEMENT;  Surgeon: Eustace Moore, MD;  Location: [place] [facility];  Service: Neurosurgery;  Laterality: N/A;  Lumbar Wound Irrigation and debridment  . SHOULDER ARTHROSCOPY WITH ROTATOR CUFF REPAIR Right 07/2013  . TONSILLECTOMY  AS CHILD  . TOTAL KNEE ARTHROPLASTY  12/18/2010   Procedure: TOTAL KNEE ARTHROPLASTY;  Surgeon: Lorn Junes, MD;  Location: [place];  Service: Orthopedics;  Laterality: Left;  ARTHROPLASTY KNEE TOTAL LEFT SIDE  . TRANSURETHRAL RESECTION OF BLADDER TUMOR N/A 05/06/2012   Procedure: TRANSURETHRAL RESECTION OF BLADDER TUMOR (TURBT) WITH INSTILATION OF MYTOMIACIN C;  Surgeon: Fredricka Bonine, MD;  Location: [facility];  Service: Urology;  Laterality: N/A;    Social History:  reports that he quit smoking about 30 years ago. His smoking use included pipe. He has a 8.00 pack-year smoking history. He quit smokeless tobacco use about 30 years ago. He reports that he drinks alcohol. He reports that he does not use drugs.  Allergies:  Allergies  Allergen Reactions  . Prozac [Fluoxetine Hcl] Other (See Comments)    "Jittery"  . Vicodin [Hydrocodone-Acetaminophen] Other (See Comments)    hallucinations    Medications: No current facility-administered medications for this encounter.    Current Outpatient Medications  Medication Sig  Dispense Refill  . aspirin EC 81 MG tablet Take 81 mg by mouth at bedtime.     Marland Kitchen b complex vitamins tablet Take 1 tablet by mouth daily with lunch.    . Coenzyme Q10 (COQ10) 200 MG CAPS Take 200 mg by mouth daily.     Marland Kitchen ibuprofen (ADVIL,MOTRIN) 200 MG tablet Take 400 mg by mouth every 6 (six) hours as needed for headache or moderate pain.     Marland Kitchen losartan (COZAAR) 100 MG tablet Take 100 mg by mouth daily.    . meloxicam (MOBIC) 15 MG tablet Take 15 mg by mouth every morning.    . Multiple Vitamin (MULTIVITAMIN WITH MINERALS) TABS Take 1 tablet by mouth daily. Centrum     . omeprazole (PRILOSEC) 40 MG capsule Take 40 mg by mouth every morning.     Marland Kitchen PARoxetine (PAXIL) 40 MG tablet Take 40 mg by mouth every morning.     Vladimir Faster Glycol-Propyl Glycol (SYSTANE OP) Apply 1 drop to eye daily as needed (for dry eyes).     . polyethylene glycol (MIRALAX) packet Take 17 g by mouth daily as needed for moderate constipation.     . senna (SENOKOT) 8.6 MG TABS tablet Take 1 tablet by mouth daily as needed for mild constipation.    . simvastatin (ZOCOR) 20 MG tablet Take 20 mg by mouth every evening.     . traMADol (ULTRAM) 50 MG tablet Take 1 tablet (50 mg total) by mouth every 6 (six) hours as needed. (Patient not taking: Reported on 02/26/2017) 20 tablet 0    No results found for this or any previous visit (from the past 48 hour(s)). No results found.  ROS: Pain with rom of the right upper extremity  Physical Exam:  Alert and oriented 84 y.o. male in no acute distress Cranial nerves 2-12 intact Cervical spine: full rom with no tenderness, nv intact distally Chest: active breath sounds bilaterally, no wheeze rhonchi or rales Heart: regular rate and rhythm, no murmur Abd: non tender non distended with active bowel sounds Hip is stable with rom  Right shoulder with limited rom and strength due to cuff arthropathy nv intact distally No rashes or edema  Assessment/Plan Assessment: right shoulder  cuff arthropathy  Plan: Patient will undergo a right reverse total shoulder by Dr. Veverly Fells at [facility]. Risks benefits and expectations were discussed with the patient. Patient understand risks, benefits and expectations and wishes to proceed.  Merla Riches PA-C, MPAS [facility] is now [employer] [address] [place], [place], [place] [address] Phone: [phone] [email or web address]  [employer]

## 2017-03-08 ENCOUNTER — Encounter (HOSPITAL_COMMUNITY): Payer: Self-pay

## 2017-03-08 ENCOUNTER — Other Ambulatory Visit: Payer: Self-pay

## 2017-03-08 ENCOUNTER — Encounter (HOSPITAL_COMMUNITY)
Admission: RE | Admit: 2017-03-08 | Discharge: 2017-03-08 | Disposition: A | Discharge status: Home / Self Care | Payer: PPO | Source: Ambulatory Visit | Attending: Orthopedic Surgery | Admitting: Orthopedic Surgery

## 2017-03-08 DIAGNOSIS — Z8551 Personal history of malignant neoplasm of bladder: Secondary | ICD-10-CM | POA: Insufficient documentation

## 2017-03-08 DIAGNOSIS — Z01818 Encounter for other preprocedural examination: Secondary | ICD-10-CM | POA: Diagnosis not present

## 2017-03-08 DIAGNOSIS — Z7982 Long term (current) use of aspirin: Secondary | ICD-10-CM | POA: Diagnosis not present

## 2017-03-08 DIAGNOSIS — Z87442 Personal history of urinary calculi: Secondary | ICD-10-CM | POA: Diagnosis not present

## 2017-03-08 DIAGNOSIS — F419 Anxiety disorder, unspecified: Secondary | ICD-10-CM | POA: Insufficient documentation

## 2017-03-08 DIAGNOSIS — F429 Obsessive-compulsive disorder, unspecified: Secondary | ICD-10-CM | POA: Diagnosis not present

## 2017-03-08 DIAGNOSIS — Z79899 Other long term (current) drug therapy: Secondary | ICD-10-CM | POA: Insufficient documentation

## 2017-03-08 DIAGNOSIS — M12811 Other specific arthropathies, not elsewhere classified, right shoulder: Secondary | ICD-10-CM | POA: Insufficient documentation

## 2017-03-08 DIAGNOSIS — K219 Gastro-esophageal reflux disease without esophagitis: Secondary | ICD-10-CM | POA: Diagnosis not present

## 2017-03-08 DIAGNOSIS — Z888 Allergy status to other drugs, medicaments and biological substances status: Secondary | ICD-10-CM | POA: Insufficient documentation

## 2017-03-08 DIAGNOSIS — Z87891 Personal history of nicotine dependence: Secondary | ICD-10-CM | POA: Insufficient documentation

## 2017-03-08 DIAGNOSIS — Z9841 Cataract extraction status, right eye: Secondary | ICD-10-CM | POA: Diagnosis not present

## 2017-03-08 DIAGNOSIS — M810 Age-related osteoporosis without current pathological fracture: Secondary | ICD-10-CM | POA: Diagnosis not present

## 2017-03-08 DIAGNOSIS — Z9842 Cataract extraction status, left eye: Secondary | ICD-10-CM | POA: Insufficient documentation

## 2017-03-08 DIAGNOSIS — Z885 Allergy status to narcotic agent status: Secondary | ICD-10-CM | POA: Diagnosis not present

## 2017-03-08 DIAGNOSIS — Z01812 Encounter for preprocedural laboratory examination: Secondary | ICD-10-CM | POA: Diagnosis not present

## 2017-03-08 DIAGNOSIS — Z961 Presence of intraocular lens: Secondary | ICD-10-CM | POA: Diagnosis not present

## 2017-03-08 DIAGNOSIS — E785 Hyperlipidemia, unspecified: Secondary | ICD-10-CM | POA: Insufficient documentation

## 2017-03-08 DIAGNOSIS — F329 Major depressive disorder, single episode, unspecified: Secondary | ICD-10-CM | POA: Diagnosis not present

## 2017-03-08 HISTORY — DX: Malignant (primary) neoplasm, unspecified: C80.1

## 2017-03-08 HISTORY — DX: Headache, unspecified: R51.9

## 2017-03-08 HISTORY — DX: Headache: R51

## 2017-03-08 HISTORY — DX: Personal history of urinary calculi: Z87.442

## 2017-03-08 HISTORY — DX: Essential (primary) hypertension: I10

## 2017-03-08 LAB — BASIC METABOLIC PANEL
Anion gap: 9 (ref 5–15)
BUN: 19 mg/dL (ref 6–20)
CO2: 25 mmol/L (ref 22–32)
Calcium: 8.8 mg/dL — ABNORMAL LOW (ref 8.9–10.3)
Chloride: 106 mmol/L (ref 101–111)
Creatinine, Ser: 0.98 mg/dL (ref 0.61–1.24)
GFR calc non Af Amer: >60 mL/min (ref >60)
Glucose, Bld: 85 mg/dL (ref 65–99)
POTASSIUM: 4.3 mmol/L (ref 3.5–5.1)
SODIUM: 140 mmol/L (ref 135–145)

## 2017-03-08 LAB — CBC
HEMATOCRIT: 45 % (ref 39.0–52.0)
Hemoglobin: 14.8 g/dL (ref 13.0–17.0)
MCH: 31.2 pg (ref 26.0–34.0)
MCHC: 32.9 g/dL (ref 30.0–36.0)
MCV: 94.7 fL (ref 78.0–100.0)
Platelets: 237 10*3/uL (ref 150–400)
RBC: 4.75 MIL/uL (ref 4.22–5.81)
RDW: 14.1 % (ref 11.5–15.5)
WBC: 5.3 10*3/uL (ref 4.0–10.5)

## 2017-03-08 LAB — SURGICAL PCR SCREEN
MRSA, PCR: NEGATIVE
STAPHYLOCOCCUS AUREUS: NEGATIVE

## 2017-03-08 NOTE — Progress Notes (Addendum)
PCP is Dr. Deland Pretty  LOV 01/2017 Denies murmur, cp, sob, no cardiac tests Urologist is Dr. Festus Aloe  [place]  08/2016

## 2017-03-08 NOTE — Pre-Procedure Instructions (Signed)
Timothy Osborn  03/08/2017      [facility] [address] [place] [address] Phone: [phone] Fax: [phone]    Your procedure is scheduled on Friday, Feb. 15th   Report to [facility] Admitting at 5:30 AM             (posted surgery time 7:30a - 9:30a)   Call this number if you have problems the morning of surgery:  [phone]   PLEASE NOTE:              4-5 days prior to surgery, STOP TAKING any Vitamins, Herbal Supplements, Anti-inflammatories.      Do not eat food or drink liquids after midnight, Thursday.   Take these medicines the morning of surgery with A SIP OF WATER : Omeprazole, Paroxetine   Do not wear jewelry - no rings or watches.  Do not wear lotions, colognes or deodorant.             Men may shave face and neck.   Do not bring valuables to the hospital.  [facility] is not responsible for any belongings or valuables.  Contacts, dentures or bridgework may not be worn into surgery.  Leave your suitcase in the car.  After surgery it may be brought to your room.  For patients admitted to the hospital, discharge time will be determined by your treatment team.  Please read over the following fact sheets that you were given. Pain Booklet, MRSA Information and Surgical Site Infection Prevention        [facility]- Preparing For Surgery  Before surgery, you can play an important role. Because skin is not sterile, your skin needs to be as free of germs as possible. You can reduce the number of germs on your skin by washing with CHG (chlorahexidine gluconate) Soap before surgery.  CHG is an antiseptic cleaner which kills germs and bonds with the skin to continue killing germs even after washing.  Please do not use if you have an allergy to CHG or antibacterial soaps. If your skin becomes reddened/irritated stop using the CHG.  Do not shave (including legs and  underarms) for at least 48 hours prior to first CHG shower. It is OK to shave your face.  Please follow these instructions carefully.   1. Shower the NIGHT BEFORE SURGERY and the MORNING OF SURGERY with CHG.   2. If you chose to wash your hair, wash your hair first as usual with your normal shampoo.  3. After you shampoo, rinse your hair and body thoroughly to remove the shampoo.  4. Use CHG as you would any other liquid soap. You can apply CHG directly to the skin and wash gently with a scrungie or a clean washcloth.   5. Apply the CHG Soap to your body ONLY FROM THE NECK DOWN.  Do not use on open wounds or open sores. Avoid contact with your eyes, ears, mouth and genitals (private parts). Wash Face and genitals (private parts)  with your normal soap.  6. Wash thoroughly, paying special attention to the area where your surgery will be performed.  7. Thoroughly rinse your body with warm water from the neck down.  8. DO NOT shower/wash with your normal soap after using and rinsing off the CHG Soap.  9. Pat yourself dry with a CLEAN TOWEL.  10. Wear CLEAN PAJAMAS to bed the night before  surgery, wear comfortable clothes the morning of surgery  11. Place CLEAN SHEETS on your bed the night of your first shower and DO NOT SLEEP WITH PETS.    Day of Surgery: Do not apply any deodorants/lotions. Please wear clean clothes to the hospital/surgery center.

## 2017-03-15 ENCOUNTER — Encounter (HOSPITAL_COMMUNITY): Payer: Self-pay | Admitting: *Deleted

## 2017-03-15 ENCOUNTER — Inpatient Hospital Stay (HOSPITAL_COMMUNITY): Payer: PPO | Admitting: Anesthesiology

## 2017-03-15 ENCOUNTER — Encounter (HOSPITAL_COMMUNITY)
Admission: RE | Disposition: A | Discharge status: Home / Self Care | Payer: Self-pay | Source: Ambulatory Visit | Attending: Orthopedic Surgery

## 2017-03-15 ENCOUNTER — Inpatient Hospital Stay (HOSPITAL_COMMUNITY)
Admission: RE | Admit: 2017-03-15 | Discharge: 2017-03-16 | DRG: 483 | Disposition: A | Discharge status: Home / Self Care | Payer: PPO | Source: Ambulatory Visit | Attending: Orthopedic Surgery | Admitting: Orthopedic Surgery

## 2017-03-15 ENCOUNTER — Inpatient Hospital Stay (HOSPITAL_COMMUNITY): Payer: PPO

## 2017-03-15 DIAGNOSIS — Z7982 Long term (current) use of aspirin: Secondary | ICD-10-CM | POA: Diagnosis not present

## 2017-03-15 DIAGNOSIS — I10 Essential (primary) hypertension: Secondary | ICD-10-CM | POA: Diagnosis present

## 2017-03-15 DIAGNOSIS — Z87891 Personal history of nicotine dependence: Secondary | ICD-10-CM

## 2017-03-15 DIAGNOSIS — Z96652 Presence of left artificial knee joint: Secondary | ICD-10-CM | POA: Diagnosis not present

## 2017-03-15 DIAGNOSIS — Z79899 Other long term (current) drug therapy: Secondary | ICD-10-CM

## 2017-03-15 DIAGNOSIS — Z96611 Presence of right artificial shoulder joint: Secondary | ICD-10-CM

## 2017-03-15 DIAGNOSIS — M25511 Pain in right shoulder: Secondary | ICD-10-CM | POA: Diagnosis present

## 2017-03-15 DIAGNOSIS — Z471 Aftercare following joint replacement surgery: Secondary | ICD-10-CM | POA: Diagnosis not present

## 2017-03-15 DIAGNOSIS — K219 Gastro-esophageal reflux disease without esophagitis: Secondary | ICD-10-CM | POA: Diagnosis not present

## 2017-03-15 DIAGNOSIS — G8918 Other acute postprocedural pain: Secondary | ICD-10-CM | POA: Diagnosis not present

## 2017-03-15 DIAGNOSIS — K449 Diaphragmatic hernia without obstruction or gangrene: Secondary | ICD-10-CM | POA: Diagnosis not present

## 2017-03-15 DIAGNOSIS — Z888 Allergy status to other drugs, medicaments and biological substances status: Secondary | ICD-10-CM

## 2017-03-15 DIAGNOSIS — Z791 Long term (current) use of non-steroidal anti-inflammatories (NSAID): Secondary | ICD-10-CM | POA: Diagnosis not present

## 2017-03-15 DIAGNOSIS — F419 Anxiety disorder, unspecified: Secondary | ICD-10-CM | POA: Diagnosis not present

## 2017-03-15 DIAGNOSIS — Z885 Allergy status to narcotic agent status: Secondary | ICD-10-CM | POA: Diagnosis not present

## 2017-03-15 DIAGNOSIS — M19011 Primary osteoarthritis, right shoulder: Secondary | ICD-10-CM | POA: Diagnosis not present

## 2017-03-15 DIAGNOSIS — F329 Major depressive disorder, single episode, unspecified: Secondary | ICD-10-CM | POA: Diagnosis present

## 2017-03-15 DIAGNOSIS — E785 Hyperlipidemia, unspecified: Secondary | ICD-10-CM | POA: Diagnosis not present

## 2017-03-15 DIAGNOSIS — M75101 Unspecified rotator cuff tear or rupture of right shoulder, not specified as traumatic: Secondary | ICD-10-CM | POA: Diagnosis present

## 2017-03-15 HISTORY — PX: REVERSE SHOULDER ARTHROPLASTY: SHX5054

## 2017-03-15 SURGERY — ARTHROPLASTY, SHOULDER, TOTAL, REVERSE
Anesthesia: General | Site: Shoulder | Laterality: Right

## 2017-03-15 MED ORDER — 0.9 % SODIUM CHLORIDE (POUR BTL) OPTIME
TOPICAL | Status: DC | PRN
  Administered 2017-03-15: 1000 mL

## 2017-03-15 MED ORDER — ONDANSETRON HCL 4 MG/2ML IJ SOLN
INTRAMUSCULAR | Status: DC | PRN
  Administered 2017-03-15: 4 mg via INTRAVENOUS

## 2017-03-15 MED ORDER — ONDANSETRON HCL 4 MG/2ML IJ SOLN
INTRAMUSCULAR | Status: AC
  Filled 2017-03-15: qty 2

## 2017-03-15 MED ORDER — SUGAMMADEX SODIUM 200 MG/2ML IV SOLN
INTRAVENOUS | Status: AC
  Filled 2017-03-15: qty 2

## 2017-03-15 MED ORDER — METOCLOPRAMIDE HCL 5 MG/ML IJ SOLN: 10.0000 mg | Freq: Once | INTRAMUSCULAR | Status: DC | PRN

## 2017-03-15 MED ORDER — ROCURONIUM BROMIDE 10 MG/ML (PF) SYRINGE
PREFILLED_SYRINGE | INTRAVENOUS | Status: AC
  Filled 2017-03-15: qty 5

## 2017-03-15 MED ORDER — LIDOCAINE HCL (CARDIAC) 20 MG/ML IV SOLN
INTRAVENOUS | Status: DC | PRN
  Administered 2017-03-15: 100 mg via INTRAVENOUS

## 2017-03-15 MED ORDER — SODIUM CHLORIDE 0.9 % IV SOLN: INTRAVENOUS | Status: DC

## 2017-03-15 MED ORDER — ROCURONIUM BROMIDE 100 MG/10ML IV SOLN
INTRAVENOUS | Status: DC | PRN
  Administered 2017-03-15: 40 mg via INTRAVENOUS

## 2017-03-15 MED ORDER — DEXAMETHASONE SODIUM PHOSPHATE 10 MG/ML IJ SOLN
INTRAMUSCULAR | Status: AC
  Filled 2017-03-15: qty 1

## 2017-03-15 MED ORDER — ACETAMINOPHEN 650 MG RE SUPP: 650.0000 mg | RECTAL | Status: DC | PRN

## 2017-03-15 MED ORDER — FENTANYL CITRATE (PF) 100 MCG/2ML IJ SOLN: 25.0000 ug | INTRAMUSCULAR | Status: DC | PRN

## 2017-03-15 MED ORDER — B COMPLEX PO TABS: 1.0000 | ORAL_TABLET | Freq: Every day | ORAL | Status: DC

## 2017-03-15 MED ORDER — FENTANYL CITRATE (PF) 250 MCG/5ML IJ SOLN
INTRAMUSCULAR | Status: AC
  Filled 2017-03-15: qty 5

## 2017-03-15 MED ORDER — ONDANSETRON HCL 4 MG PO TABS: 4.0000 mg | ORAL_TABLET | Freq: Four times a day (QID) | ORAL | Status: DC | PRN

## 2017-03-15 MED ORDER — MENTHOL 3 MG MT LOZG: 1.0000 | LOZENGE | OROMUCOSAL | Status: DC | PRN

## 2017-03-15 MED ORDER — POLYETHYL GLYCOL-PROPYL GLYCOL 0.4-0.3 % OP GEL
Freq: Every day | OPHTHALMIC | Status: DC | PRN
  Filled 2017-03-15: qty 10

## 2017-03-15 MED ORDER — ACETAMINOPHEN 325 MG PO TABS
650.0000 mg | ORAL_TABLET | ORAL | Status: DC | PRN
  Administered 2017-03-15 – 2017-03-16 (×2): 650 mg via ORAL
  Filled 2017-03-15 (×2): qty 2

## 2017-03-15 MED ORDER — POLYETHYLENE GLYCOL 3350 17 G PO PACK: 17.0000 g | PACK | Freq: Every day | ORAL | Status: DC | PRN

## 2017-03-15 MED ORDER — PROPOFOL 10 MG/ML IV BOLUS
INTRAVENOUS | Status: AC
  Filled 2017-03-15: qty 20

## 2017-03-15 MED ORDER — TRAMADOL HCL 50 MG PO TABS: 50.0000 mg | ORAL_TABLET | Freq: Four times a day (QID) | ORAL | Status: DC | PRN

## 2017-03-15 MED ORDER — BUPIVACAINE-EPINEPHRINE 0.25% -1:200000 IJ SOLN
INTRAMUSCULAR | Status: AC
  Filled 2017-03-15: qty 1

## 2017-03-15 MED ORDER — B COMPLEX-C PO TABS
1.0000 | ORAL_TABLET | Freq: Every day | ORAL | Status: DC
  Administered 2017-03-15 – 2017-03-16 (×2): 1 via ORAL
  Filled 2017-03-15 (×2): qty 1

## 2017-03-15 MED ORDER — BUPIVACAINE-EPINEPHRINE 0.25% -1:200000 IJ SOLN
INTRAMUSCULAR | Status: DC | PRN
  Administered 2017-03-15: 8.5 mL

## 2017-03-15 MED ORDER — PAROXETINE HCL 20 MG PO TABS
40.0000 mg | ORAL_TABLET | Freq: Every morning | ORAL | Status: DC
  Administered 2017-03-16: 40 mg via ORAL
  Filled 2017-03-15: qty 2

## 2017-03-15 MED ORDER — DEXAMETHASONE SODIUM PHOSPHATE 10 MG/ML IJ SOLN
INTRAMUSCULAR | Status: DC | PRN
  Administered 2017-03-15: 10 mg via INTRAVENOUS

## 2017-03-15 MED ORDER — ADULT MULTIVITAMIN W/MINERALS CH: 1.0000 | ORAL_TABLET | Freq: Every day | ORAL | Status: DC

## 2017-03-15 MED ORDER — CHLORHEXIDINE GLUCONATE 4 % EX LIQD: 60.0000 mL | Freq: Once | CUTANEOUS | Status: DC

## 2017-03-15 MED ORDER — METOCLOPRAMIDE HCL 5 MG PO TABS: 5.0000 mg | ORAL_TABLET | Freq: Three times a day (TID) | ORAL | Status: DC | PRN

## 2017-03-15 MED ORDER — OXYCODONE HCL 5 MG PO TABS
5.0000 mg | ORAL_TABLET | ORAL | Status: DC | PRN
  Administered 2017-03-15 – 2017-03-16 (×4): 5 mg via ORAL
  Filled 2017-03-15 (×4): qty 1

## 2017-03-15 MED ORDER — ASPIRIN EC 81 MG PO TBEC
81.0000 mg | DELAYED_RELEASE_TABLET | Freq: Every day | ORAL | Status: DC
  Administered 2017-03-15: 81 mg via ORAL
  Filled 2017-03-15: qty 1

## 2017-03-15 MED ORDER — SENNA 8.6 MG PO TABS
1.0000 | ORAL_TABLET | Freq: Every day | ORAL | Status: DC | PRN
  Filled 2017-03-15: qty 1

## 2017-03-15 MED ORDER — PHENYLEPHRINE HCL 10 MG/ML IJ SOLN
INTRAVENOUS | Status: DC | PRN
  Administered 2017-03-15: 50 ug/min via INTRAVENOUS

## 2017-03-15 MED ORDER — GLYCOPYRROLATE 0.2 MG/ML IJ SOLN
INTRAMUSCULAR | Status: DC | PRN
  Administered 2017-03-15: 0.2 mg via INTRAVENOUS

## 2017-03-15 MED ORDER — FENTANYL CITRATE (PF) 100 MCG/2ML IJ SOLN
INTRAMUSCULAR | Status: DC | PRN
  Administered 2017-03-15 (×2): 25 ug via INTRAVENOUS
  Administered 2017-03-15: 50 ug via INTRAVENOUS

## 2017-03-15 MED ORDER — SUGAMMADEX SODIUM 200 MG/2ML IV SOLN
INTRAVENOUS | Status: DC | PRN
  Administered 2017-03-15: 200 mg via INTRAVENOUS

## 2017-03-15 MED ORDER — LOSARTAN POTASSIUM 50 MG PO TABS: 100.0000 mg | ORAL_TABLET | Freq: Every day | ORAL | Status: DC

## 2017-03-15 MED ORDER — IBUPROFEN 200 MG PO TABS: 400.0000 mg | ORAL_TABLET | Freq: Four times a day (QID) | ORAL | Status: DC | PRN

## 2017-03-15 MED ORDER — PHENOL 1.4 % MT LIQD: 1.0000 | OROMUCOSAL | Status: DC | PRN

## 2017-03-15 MED ORDER — ROPIVACAINE HCL 5 MG/ML IJ SOLN
INTRAMUSCULAR | Status: DC | PRN
  Administered 2017-03-15: 30 mL via PERINEURAL

## 2017-03-15 MED ORDER — COQ10 200 MG PO CAPS: 200.0000 mg | ORAL_CAPSULE | Freq: Every day | ORAL | Status: DC

## 2017-03-15 MED ORDER — PANTOPRAZOLE SODIUM 40 MG PO TBEC
40.0000 mg | DELAYED_RELEASE_TABLET | Freq: Every day | ORAL | Status: DC
  Administered 2017-03-16: 40 mg via ORAL
  Filled 2017-03-15: qty 1

## 2017-03-15 MED ORDER — MEPERIDINE HCL 50 MG/ML IJ SOLN: 6.2500 mg | INTRAMUSCULAR | Status: DC | PRN

## 2017-03-15 MED ORDER — LACTATED RINGERS IV SOLN
INTRAVENOUS | Status: DC | PRN
  Administered 2017-03-15: 07:00:00 via INTRAVENOUS

## 2017-03-15 MED ORDER — CEFAZOLIN SODIUM-DEXTROSE 2-4 GM/100ML-% IV SOLN
2.0000 g | INTRAVENOUS | Status: AC
  Administered 2017-03-15: 2 g via INTRAVENOUS
  Filled 2017-03-15: qty 100

## 2017-03-15 MED ORDER — CEFAZOLIN SODIUM-DEXTROSE 2-4 GM/100ML-% IV SOLN
2.0000 g | Freq: Four times a day (QID) | INTRAVENOUS | Status: AC
  Administered 2017-03-15 – 2017-03-16 (×3): 2 g via INTRAVENOUS
  Filled 2017-03-15 (×3): qty 100

## 2017-03-15 MED ORDER — LIDOCAINE 2% (20 MG/ML) 5 ML SYRINGE
INTRAMUSCULAR | Status: AC
  Filled 2017-03-15: qty 5

## 2017-03-15 MED ORDER — DOCUSATE SODIUM 100 MG PO CAPS
100.0000 mg | ORAL_CAPSULE | Freq: Two times a day (BID) | ORAL | Status: DC
  Administered 2017-03-15 – 2017-03-16 (×2): 100 mg via ORAL
  Filled 2017-03-15 (×2): qty 1

## 2017-03-15 MED ORDER — PROPOFOL 10 MG/ML IV BOLUS
INTRAVENOUS | Status: DC | PRN
  Administered 2017-03-15: 110 mg via INTRAVENOUS
  Administered 2017-03-15: 20 mg via INTRAVENOUS

## 2017-03-15 MED ORDER — MELOXICAM 7.5 MG PO TABS
15.0000 mg | ORAL_TABLET | Freq: Every morning | ORAL | Status: DC
  Administered 2017-03-15 – 2017-03-16 (×2): 15 mg via ORAL
  Filled 2017-03-15 (×2): qty 2

## 2017-03-15 MED ORDER — OXYCODONE HCL 5 MG PO TABS: 5.0000 mg | ORAL_TABLET | ORAL | 0 refills | Status: DC | PRN

## 2017-03-15 MED ORDER — SIMVASTATIN 20 MG PO TABS: 20.0000 mg | ORAL_TABLET | Freq: Every evening | ORAL | Status: DC

## 2017-03-15 MED ORDER — ONDANSETRON HCL 4 MG/2ML IJ SOLN: 4.0000 mg | Freq: Four times a day (QID) | INTRAMUSCULAR | Status: DC | PRN

## 2017-03-15 MED ORDER — METOCLOPRAMIDE HCL 5 MG/ML IJ SOLN: 5.0000 mg | Freq: Three times a day (TID) | INTRAMUSCULAR | Status: DC | PRN

## 2017-03-15 SURGICAL SUPPLY — 71 items
BIT DRILL 5/64X5 DISP (BIT) ×1 IMPLANT
BIT DRILL TWIST 2.7 (BIT) IMPLANT
BIT DRILL TWIST 2.7MM (BIT)
BLADE SAG 18X100X1.27 (BLADE) ×3 IMPLANT
CAPT SHLDR REVTOTAL 2 ×2 IMPLANT
CLOSURE STERI-STRIP 1/2X4 (GAUZE/BANDAGES/DRESSINGS) ×1
CLOSURE WOUND 1/2 X4 (GAUZE/BANDAGES/DRESSINGS) ×1
CLSR STERI-STRIP ANTIMIC 1/2X4 (GAUZE/BANDAGES/DRESSINGS) ×1 IMPLANT
COVER SURGICAL LIGHT HANDLE (MISCELLANEOUS) ×3 IMPLANT
DRAPE INCISE IOBAN 66X45 STRL (DRAPES) ×3 IMPLANT
DRAPE ORTHO SPLIT 77X108 STRL (DRAPES) ×6
DRAPE SURG ORHT 6 SPLT 77X108 (DRAPES) ×2 IMPLANT
DRAPE U-SHAPE 47X51 STRL (DRAPES) ×3 IMPLANT
DRSG ADAPTIC 3X8 NADH LF (GAUZE/BANDAGES/DRESSINGS) ×3 IMPLANT
DRSG PAD ABDOMINAL 8X10 ST (GAUZE/BANDAGES/DRESSINGS) ×5 IMPLANT
DURAPREP 26ML APPLICATOR (WOUND CARE) ×3 IMPLANT
ELECT BLADE 4.0 EZ CLEAN MEGAD (MISCELLANEOUS) ×3
ELECT NDL TIP 2.8 STRL (NEEDLE) ×1 IMPLANT
ELECT NEEDLE TIP 2.8 STRL (NEEDLE) ×3 IMPLANT
ELECT REM PT RETURN 9FT ADLT (ELECTROSURGICAL) ×3
ELECTRODE BLDE 4.0 EZ CLN MEGD (MISCELLANEOUS) ×1 IMPLANT
ELECTRODE REM PT RTRN 9FT ADLT (ELECTROSURGICAL) ×1 IMPLANT
GAUZE SPONGE 4X4 12PLY STRL (GAUZE/BANDAGES/DRESSINGS) ×3 IMPLANT
GLOVE BIOGEL PI IND STRL 6.5 (GLOVE) IMPLANT
GLOVE BIOGEL PI IND STRL 7.0 (GLOVE) IMPLANT
GLOVE BIOGEL PI IND STRL 7.5 (GLOVE) IMPLANT
GLOVE BIOGEL PI INDICATOR 6.5 (GLOVE) ×2
GLOVE BIOGEL PI INDICATOR 7.0 (GLOVE) ×2
GLOVE BIOGEL PI INDICATOR 7.5 (GLOVE) ×2
GLOVE BIOGEL PI ORTHO PRO 7.5 (GLOVE) ×2
GLOVE BIOGEL PI ORTHO PRO SZ8 (GLOVE) ×2
GLOVE ORTHO TXT STRL SZ7.5 (GLOVE) ×5 IMPLANT
GLOVE PI ORTHO PRO STRL 7.5 (GLOVE) ×1 IMPLANT
GLOVE PI ORTHO PRO STRL SZ8 (GLOVE) ×1 IMPLANT
GLOVE SURG ORTHO 8.5 STRL (GLOVE) ×3 IMPLANT
GLOVE SURG SS PI 6.0 STRL IVOR (GLOVE) ×2 IMPLANT
GOWN STRL REUS W/ TWL LRG LVL3 (GOWN DISPOSABLE) ×1 IMPLANT
GOWN STRL REUS W/ TWL XL LVL3 (GOWN DISPOSABLE) ×2 IMPLANT
GOWN STRL REUS W/TWL LRG LVL3 (GOWN DISPOSABLE) ×3
GOWN STRL REUS W/TWL XL LVL3 (GOWN DISPOSABLE) ×9
KIT BASIN OR (CUSTOM PROCEDURE TRAY) ×3 IMPLANT
KIT ROOM TURNOVER OR (KITS) ×3 IMPLANT
MANIFOLD NEPTUNE II (INSTRUMENTS) ×3 IMPLANT
NDL 1/2 CIR MAYO (NEEDLE) ×1 IMPLANT
NDL HYPO 25GX1X1/2 BEV (NEEDLE) ×1 IMPLANT
NEEDLE 1/2 CIR MAYO (NEEDLE) ×3 IMPLANT
NEEDLE HYPO 25GX1X1/2 BEV (NEEDLE) ×3 IMPLANT
NS IRRIG 1000ML POUR BTL (IV SOLUTION) ×3 IMPLANT
PACK SHOULDER (CUSTOM PROCEDURE TRAY) ×3 IMPLANT
PAD ARMBOARD 7.5X6 YLW CONV (MISCELLANEOUS) ×6 IMPLANT
PIN STEINMANN THREADED TIP (PIN) IMPLANT
PIN THREADED REVERSE (PIN) IMPLANT
SLING ARM FOAM STRAP LRG (SOFTGOODS) ×2 IMPLANT
SPONGE LAP 18X18 X RAY DECT (DISPOSABLE) IMPLANT
SPONGE LAP 4X18 X RAY DECT (DISPOSABLE) ×3 IMPLANT
STRIP CLOSURE SKIN 1/2X4 (GAUZE/BANDAGES/DRESSINGS) ×2 IMPLANT
SUCTION FRAZIER HANDLE 10FR (MISCELLANEOUS) ×2
SUCTION TUBE FRAZIER 10FR DISP (MISCELLANEOUS) ×1 IMPLANT
SUT FIBERWIRE #2 38 T-5 BLUE (SUTURE) ×6
SUT MNCRL AB 4-0 PS2 18 (SUTURE) ×3 IMPLANT
SUT VIC AB 0 CT2 27 (SUTURE) ×3 IMPLANT
SUT VIC AB 2-0 CT1 27 (SUTURE) ×3
SUT VIC AB 2-0 CT1 TAPERPNT 27 (SUTURE) ×1 IMPLANT
SUT VICRYL 0 CT 1 36IN (SUTURE) ×3 IMPLANT
SUTURE FIBERWR #2 38 T-5 BLUE (SUTURE) IMPLANT
SYR CONTROL 10ML LL (SYRINGE) ×3 IMPLANT
TAPE CLOTH SURG 6X10 WHT LF (GAUZE/BANDAGES/DRESSINGS) ×2 IMPLANT
TOWEL OR 17X24 6PK STRL BLUE (TOWEL DISPOSABLE) ×1 IMPLANT
TOWEL OR 17X26 10 PK STRL BLUE (TOWEL DISPOSABLE) ×3 IMPLANT
TOWER CARTRIDGE SMART MIX (DISPOSABLE) IMPLANT
YANKAUER SUCT BULB TIP NO VENT (SUCTIONS) ×3 IMPLANT

## 2017-03-15 NOTE — Interval H&P Note (Signed)
History and Physical Interval Note:  03/15/2017 7:06 AM  Timothy Osborn  has presented today for surgery, with the diagnosis of right shoulder rotator cuff arthropathy  The various methods of treatment have been discussed with the patient and family. After consideration of risks, benefits and other options for treatment, the patient has consented to  Procedure(s): RIGHT REVERSE SHOULDER ARTHROPLASTY (Right) as a surgical intervention .  The patient's history has been reviewed, patient examined, no change in status, stable for surgery.  I have reviewed the patient's chart and labs.  Questions were answered to the patient's satisfaction.     Nalla Purdy,STEVEN R

## 2017-03-15 NOTE — Anesthesia Preprocedure Evaluation (Addendum)
Anesthesia Evaluation  Patient identified by MRN, date of birth, ID band Patient awake    Reviewed: Allergy & Precautions, NPO status , Patient's Chart, lab work & pertinent test results  Airway Mallampati: II  TM Distance: >3 FB Neck ROM: Full    Dental no notable dental hx. (+) Teeth Intact,    Pulmonary former smoker,    Pulmonary exam normal breath sounds clear to auscultation       Cardiovascular hypertension, Pt. on medications negative cardio ROS Normal cardiovascular exam Rhythm:Regular Rate:Normal     Neuro/Psych Spinal stenosis negative psych ROS   GI/Hepatic negative GI ROS, Neg liver ROS, hiatal hernia, GERD  Controlled,  Endo/Other  negative endocrine ROS  Renal/GU negative Renal ROS  negative genitourinary   Musculoskeletal negative musculoskeletal ROS (+)   Abdominal   Peds negative pediatric ROS (+)  Hematology negative hematology ROS (+)   Anesthesia Other Findings   Reproductive/Obstetrics negative OB ROS                            Anesthesia Physical Anesthesia Plan  ASA: II  Anesthesia Plan: General   Post-op Pain Management: GA combined w/ Regional for post-op pain   Induction: Intravenous  PONV Risk Score and Plan: 2 and Ondansetron, Dexamethasone and Treatment may vary due to age or medical condition  Airway Management Planned: Oral ETT  Additional Equipment:   Intra-op Plan:   Post-operative Plan: Extubation in OR  Informed Consent: I have reviewed the patients History and Physical, chart, labs and discussed the procedure including the risks, benefits and alternatives for the proposed anesthesia with the patient or authorized representative who has indicated his/her understanding and acceptance.   Dental advisory given  Plan Discussed with: CRNA  Anesthesia Plan Comments:         Anesthesia Quick Evaluation

## 2017-03-15 NOTE — Transfer of Care (Signed)
Immediate Anesthesia Transfer of Care Note  Patient: Timothy Osborn  Procedure(s) Performed: RIGHT REVERSE SHOULDER ARTHROPLASTY (Right Shoulder)  Patient Location: PACU  Anesthesia Type:GA combined with regional for post-op pain  Level of Consciousness: awake, alert  and oriented  Airway & Oxygen Therapy: Patient Spontanous Breathing and Patient connected to nasal cannula oxygen  Post-op Assessment: Report given to RN and Post -op Vital signs reviewed and stable  Post vital signs: Reviewed and stable  Last Vitals:  Vitals:   03/15/17 0551  BP: (!) 160/82  Pulse: 65  Resp: 20  Temp: 36.5 C  SpO2: 96%    Last Pain:  Vitals:   03/15/17 0551  TempSrc: Oral      Patients Stated Pain Goal: 6 (62/03/55 9741)  Complications: No apparent anesthesia complications

## 2017-03-15 NOTE — Anesthesia Procedure Notes (Signed)
Anesthesia Regional Block: Supraclavicular block   Pre-Anesthetic Checklist: ,, timeout performed, Correct Patient, Correct Site, Correct Laterality, Correct Procedure, Correct Position, site marked, Risks and benefits discussed,  Surgical consent,  Pre-op evaluation,  At surgeon's request and post-op pain management  Laterality: Right and Upper  Prep: Maximum Sterile Barrier Precautions used, chloraprep       Needles:  Injection technique: Single-shot  Needle Type: Echogenic Stimulator Needle     Needle Length: 10cm      Additional Needles:   Procedures:,,,, ultrasound used (permanent image in chart),,,,  Narrative:  Start time: 03/15/2017 7:04 AM End time: 03/15/2017 7:09 AM Injection made incrementally with aspirations every 5 mL.  Performed by: Personally  Anesthesiologist: Montez Hageman, MD  Additional Notes: Risks, benefits and alternative to block explained extensively.  Patient tolerated procedure well, without complications.

## 2017-03-15 NOTE — Discharge Instructions (Signed)
Keep a pillow or folded blanket propped behind the right elbow to keep your arm across your waist. (make sure you can see your elbow)  Ice to the shoulder as much as you can.  Do your exercises four or five times per day as instructed.  Ok to use the right arm and hand for gentle ADLs or self care.  Keep the incision covered and clean and dry for one week, then ok to get it wet in the shower.  Follow up in the office in two weeks [phone]

## 2017-03-15 NOTE — Anesthesia Postprocedure Evaluation (Signed)
Anesthesia Post Note  Patient: Timothy Osborn  Procedure(s) Performed: RIGHT REVERSE SHOULDER ARTHROPLASTY (Right Shoulder)     Patient location during evaluation: PACU Anesthesia Type: General Level of consciousness: awake and alert Pain management: pain level controlled Vital Signs Assessment: post-procedure vital signs reviewed and stable Respiratory status: spontaneous breathing, nonlabored ventilation, respiratory function stable and patient connected to nasal cannula oxygen Cardiovascular status: blood pressure returned to baseline and stable Postop Assessment: no apparent nausea or vomiting Anesthetic complications: no    Last Vitals:  Vitals:   03/15/17 1025 03/15/17 1046  BP:  124/68  Pulse: 74 80  Resp: 14 16  Temp: (!) 36.2 C 36.5 C  SpO2: 92% 94%    Last Pain:  Vitals:   03/15/17 1015  TempSrc:   PainSc: 0-No pain                 Montez Hageman

## 2017-03-15 NOTE — Brief Op Note (Signed)
03/15/2017  9:21 AM  PATIENT:  Timothy Osborn  84 y.o. male  PRE-OPERATIVE DIAGNOSIS:  right shoulder rotator cuff arthropathy  POST-OPERATIVE DIAGNOSIS:  right shoulder rotator cuff arthropathy  PROCEDURE:  Procedure(s): RIGHT REVERSE SHOULDER ARTHROPLASTY (Right) Biomet Comprehensive Reverse  SURGEON:  Surgeon(s) and Role:    Netta Cedars, MD - Primary  PHYSICIAN ASSISTANT:   ASSISTANTS: Ventura Bruns, PA-C   ANESTHESIA:   regional and general  EBL:  200 mL   BLOOD ADMINISTERED:none  DRAINS: none   LOCAL MEDICATIONS USED:  MARCAINE     SPECIMEN:  No Specimen  DISPOSITION OF SPECIMEN:  N/A  COUNTS:  YES  TOURNIQUET:  * No tourniquets in log *  DICTATION: .Other Dictation: Dictation Number [id number]  PLAN OF CARE: Admit to inpatient   PATIENT DISPOSITION:  PACU - hemodynamically stable.   Delay start of Pharmacological VTE agent (>24hrs) due to surgical blood loss or risk of bleeding: not applicable

## 2017-03-15 NOTE — Anesthesia Procedure Notes (Signed)
Procedure Name: Intubation Date/Time: 03/15/2017 7:30 AM Performed by: Kyung Rudd, CRNA Pre-anesthesia Checklist: Patient identified, Emergency Drugs available, Suction available and Patient being monitored Patient Re-evaluated:Patient Re-evaluated prior to induction Oxygen Delivery Method: Circle system utilized Preoxygenation: Pre-oxygenation with 100% oxygen Induction Type: IV induction Ventilation: Mask ventilation without difficulty Laryngoscope Size: Mac and 4 Grade View: Grade I Tube type: Oral Tube size: 7.5 mm Number of attempts: 1 Airway Equipment and Method: Stylet Placement Confirmation: ETT inserted through vocal cords under direct vision,  positive ETCO2 and breath sounds checked- equal and bilateral Secured at: 21 cm Tube secured with: Tape Dental Injury: Teeth and Oropharynx as per pre-operative assessment

## 2017-03-16 LAB — BASIC METABOLIC PANEL
ANION GAP: 11 (ref 5–15)
BUN: 21 mg/dL — ABNORMAL HIGH (ref 6–20)
CALCIUM: 8.6 mg/dL — AB (ref 8.9–10.3)
CHLORIDE: 104 mmol/L (ref 101–111)
CO2: 24 mmol/L (ref 22–32)
CREATININE: 0.91 mg/dL (ref 0.61–1.24)
GFR calc non Af Amer: >60 mL/min (ref >60)
Glucose, Bld: 122 mg/dL — ABNORMAL HIGH (ref 65–99)
Potassium: 4.3 mmol/L (ref 3.5–5.1)
SODIUM: 139 mmol/L (ref 135–145)

## 2017-03-16 LAB — HEMOGLOBIN AND HEMATOCRIT, BLOOD
HCT: 40.9 % (ref 39.0–52.0)
Hemoglobin: 13.5 g/dL (ref 13.0–17.0)

## 2017-03-16 NOTE — Progress Notes (Signed)
Subjective: 1 Day Post-Op Procedure(s) (LRB): RIGHT REVERSE SHOULDER ARTHROPLASTY (Right) Patient reports pain as mild to right shoulder.  Tolerating PO's. Denies SOB or CP.  Objective: Vital signs in last 24 hours: Temp:  [97.2 F (36.2 C)-98.4 F (36.9 C)] 98.4 F (36.9 C) (02/16 0718) Pulse Rate:  [58-92] 76 (02/16 0718) Resp:  [12-18] 16 (02/16 0718) BP: (104-124)/(63-77) 104/66 (02/16 0718) SpO2:  [92 %-95 %] 92 % (02/16 0718)  Intake/Output from previous day: 02/15 0701 - 02/16 0700 In: 1300 [P.O.:600; I.V.:600] Out: 200 [Blood:200] Intake/Output this shift: Total I/O In: 360 [P.O.:360] Out: -   Recent Labs    03/16/17 0552  HGB 13.5   Recent Labs    03/16/17 0552  HCT 40.9   Recent Labs    03/16/17 0552  NA 139  K 4.3  CL 104  CO2 24  BUN 21*  CREATININE 0.91  GLUCOSE 122*  CALCIUM 8.6*   No results for input(s): LABPT, INR in the last 72 hours.  Well nourished. Alert and oriented x3. RRR, Lungs clear, BS x4. Abdomen soft and non tender. Right Calf soft and non tender. Right shoulder dressing C/D/I. No DVT signs. Compartment soft. No signs of infection.  Right UE grossly neurovascular intact.  Assessment/Plan: 1 Day Post-Op Procedure(s) (LRB): RIGHT REVERSE SHOULDER ARTHROPLASTY (Right) D/c home F/u in office  Follow instructions Aquacel applied OT this am  Voris Tigert L 03/16/2017, 9:18 AM

## 2017-03-16 NOTE — Evaluation (Addendum)
Occupational Therapy Evaluation and Discharge Patient Details Name: Timothy Osborn MRN: [id number] DOB: March 28, 1928 Today's Date: 03/16/2017    History of Present Illness RIGHT REVERSE SHOULDER ARTHROPLASTY    Clinical Impression   This 84 yo male admitted and underwent above presents to acute OT with all education completed, we will D/C from acute OT. Post op shoulder handout including elbow -wrist exercises provided.    Follow Up Recommendations  Other (comment)(per MD)    Equipment Recommendations  None recommended by OT       Precautions / Restrictions Precautions Precautions: Shoulder Shoulder Interventions: Shoulder sling/immobilizer;Off for dressing/bathing/exercises Restrictions Weight Bearing Restrictions: No      Mobility Bed Mobility Overal bed mobility: Independent                Transfers Overall transfer level: Independent                        ADL either performed or assessed with clinical judgement         Vision Patient Visual Report: No change from baseline              Pertinent Vitals/Pain Pain Assessment: No/denies pain     Hand Dominance Right   Extremity/Trunk Assessment Upper Extremity Assessment Upper Extremity Assessment: RUE deficits/detail RUE Deficits / Details: shoulder sx this admission; elbow to hand WFL RUE Coordination: decreased gross motor           Communication Communication Communication: No difficulties   Cognition Arousal/Alertness: Awake/alert Behavior During Therapy: WFL for tasks assessed/performed Overall Cognitive Status: Within Functional Limits for tasks assessed                                        Exercises Other Exercises Other Exercises: Pt completed 10 reps of elbow-hand exercises   Shoulder Instructions Shoulder Instructions Donning/doffing shirt without moving shoulder: Caregiver independent with task;Patient able to independently direct  caregiver Method for sponge bathing under operated UE: Patient able to independently direct caregiver;Caregiver independent with task Donning/doffing sling/immobilizer: Caregiver independent with task;Patient able to independently direct caregiver Correct positioning of sling/immobilizer: Caregiver independent with task;Patient able to independently direct caregiver Pendulum exercises (written home exercise program): (NA) ROM for elbow, wrist and digits of operated UE: Independent Sling wearing schedule (on at all times/off for ADL's): Independent Proper positioning of operated UE when showering: Independent Dressing change: (NA) Positioning of UE while sleeping: Patient able to independently direct caregiver;Caregiver independent with task    Home Living Family/patient expects to be discharged to:: Private residence Living Arrangements: Spouse/significant other Available Help at Discharge: Family;Available 24 hours/day Type of Home: House Home Access: Stairs to enter [employer] of Steps: 3 Entrance Stairs-Rails: Can reach both Home Layout: Two level   Alternate Level Stairs-Rails: Can reach both Bathroom Shower/Tub: Occupational psychologist: Handicapped height     Home Equipment: Cane - single point;Walker - 2 wheels;Bedside commode;Adaptive equipment Adaptive Equipment: Reacher;Sock aid;Long-handled shoe horn        Prior Functioning/Environment Level of Independence: Independent        Comments: Very active pta        OT Problem List: Decreased range of motion;Impaired UE functional use         OT Goals(Current goals can be found in the care plan section) Acute Rehab OT Goals Patient Stated Goal: home today  OT Frequency:                AM-PAC PT "6 Clicks" Daily Activity     Outcome Measure Help from another person eating meals?: A Little Help from another person taking care of personal grooming?: A Little Help from another person  toileting, which includes using toliet, bedpan, or urinal?: A Little Help from another person bathing (including washing, rinsing, drying)?: A Little Help from another person to put on and taking off regular upper body clothing?: A Little Help from another person to put on and taking off regular lower body clothing?: A Little 6 Click Score: 18   End of Session Equipment Utilized During Treatment: (sling) Nurse Communication: (pt ready to go from OT standpoint)  Activity Tolerance: Patient tolerated treatment well Patient left: in bed;with nursing/sitter in room                   Time: 5320-2334 OT Time Calculation (min): 27 min Charges:  OT General Charges $OT Visit: 1 Visit OT Evaluation $OT Eval Moderate Complexity: 1 Mod OT Treatments $Self Care/Home Management : 8-22 mins Golden Circle, OTR/L [phone] 03/16/2017

## 2017-03-16 NOTE — Progress Notes (Signed)
Patient alert and oriented, mae's well, voiding adequate amount of urine, swallowing without difficulty, no c/o pain at time of discharge. Patient discharged home with family. Script and discharged instructions given to patient. Patient and family stated understanding of instructions given. Patient has an appointment with Dr. Norris 

## 2017-03-18 ENCOUNTER — Encounter (HOSPITAL_COMMUNITY): Payer: Self-pay | Admitting: Orthopedic Surgery

## 2017-03-18 NOTE — Op Note (Signed)
NAME:  Timothy Osborn, Timothy Osborn                    ACCOUNT NO.:  MEDICAL RECORD NO.:  [id number]  LOCATION:                                 FACILITY:  PHYSICIAN:  Doran Heater. Veverly Fells, M.D.      DATE OF BIRTH:  DATE OF PROCEDURE:  03/15/2017 DATE OF DISCHARGE:                              OPERATIVE REPORT   PREOPERATIVE DIAGNOSIS:  Right shoulder end-stage arthritis/rotator cuff tear arthropathy.  POSTOPERATIVE DIAGNOSIS:  Right shoulder end-stage arthritis/rotator cuff tear arthropathy.  PROCEDURE PERFORMED:  Right reverse total shoulder arthroplasty using Biomet Comprehensive System.  SURGEON:  Doran Heater. Veverly Fells, MD.  ASSISTANT:  Abbott Pao. Dixon, PA-C who has scrubbed during the entire procedure and necessary for satisfactory completion of surgery.  ANESTHESIA:  General anesthesia was used plus interscalene block.  ESTIMATED BLOOD LOSS:  200 mL.  FLUID REPLACEMENT:  1500 mL crystalloid.  INSTRUMENT COUNTS:  Correct.  COMPLICATIONS:  None.  Perioperative antibiotics were given.  INDICATIONS:  The patient is an 84 year old male with worsening right shoulder pain and dysfunction secondary to rotator cuff tear arthropathy.  The patient has had progressive pain despite conservative management.  He has also functional impairment that interferes with activities of daily living.  Due to threatening his independence in consultation with his medical team and with careful consultation with the family, decision was made to proceed with surgical management over continued nonsurgical management which was failed.  Desire was for restoration of function and elimination of pain, reverse total shoulder arthroplasty was felt to be the best option for him.  Risks and benefits of surgery discussed in detail.  Informed consent obtained.  DESCRIPTION OF PROCEDURE:  After an adequate level of anesthesia achieved, the patient was positioned in modified beach-chair position. Right shoulder correctly  identified and sterilely prepped and draped in usual manner.  Time-out was called.  We initiated the approach to the shoulder through a standard deltopectoral incision, starting at the coracoid process extending down to the anterior humerus.  Dissection down through subcutaneous tissues using needle-tip Bovie identified the cephalic vein and took it laterally with the deltoid, pectoralis taken medially.  Conjoint tendon identified, retracted medially.  Biceps tenodesed in situ.  The remnant of subscapularis which was quite thin and atrophic was released off the lesser tuberosity and tagged for retraction and protection of the axillary nerve.  Next, we released the remaining supraspinatus tendon which was very little, perhaps 0.5 cm that was released, and then the shoulder medially extended and externally rotated.  We entered the proximal humerus with starting reamer and then reamed up to a size 12 diameter reamer.  We then used the reamer in place as an intramedullary guide for resection of the head, which we resected at 30 degrees of retroversion for the Biomet Comprehensive System.  Once we had our head resection performed, we removed the excess osteophytes with a rongeur.  We then placed the humerus back into the wound and retracted that posteriorly.  We had 360- degree exposure of the glenoid and we performed a capsule labral excision.  We also removed the stump of the biceps.  The biceps had already torn.  Once  we had thorough 360 exposure and retractors placed and the axillary nerve protected, we placed our center guide pin for the baseplate preparation.  We then reamed for the baseplate with the appropriate reamer and then impacted the baseplate into position, and that is the glenosphere mini baseplate for the comprehensive reverse system.  Once that was in position, we placed our central lag screw.  We measured that to 25 mm length that was a 6.5 lag screw and central screw, and  then we did a 30 inferiorly and a 30 superiorly, those were both lock screws, and then a nonlocked 15 posteriorly.  We had good baseplate fixation and purchase.  We then went ahead and selected a 41 glenosphere which we set on the B-setting and dialed inferiorly.  Once the 41 glenosphere was in position and secured, we impacted that in place and inserted security by pulling on it and it did not move.  We then went to the humeral side, completed our humeral preparation with a 10 broach, 11 broach, and then 12 broach, set on 30 degrees of retroversion.  We left the broach in place, put the standard offset baseplate on and poly trial with no additional offset and then reduced the shoulder.  We had a nice little pop there, had excellent tensioning of the deltoid and stability, conjoint was tight.  The axillary nerve was under tension during extension, but not overly tensioned with the arm neutral and no gapping with external rotation or inferior sulcus move.  We then dislocated the components with some difficulty, but we were able to get that dislocated and then removed the trial components from the humeral side, irrigated thoroughly, selected the press-fit size 12 mini stem and impacted that in position on the humeral side, and then selected the standard offset 41 bearing with the 44 humeral locking tray that was placed on the mini stem.  We had excellent coverage with that and we reduced the shoulder and had a nice pop and excellent stability with our implant.  We irrigated thoroughly and then resected the remaining remnant of the subscap.  We then did our deltopectoral repair with interrupted #1 Vicryl suture followed by 2-0 Vicryl for subcutaneous closure and 4-0 Monocryl for skin.  Steri-Strips applied followed by sterile dressing and patient placed in a sling and transported to the recovery room in stable condition.     Doran Heater. Veverly Fells, M.D.     SRN/MEDQ  D:  03/15/2017  T:   03/15/2017  Job:  [id number]

## 2017-03-18 NOTE — Discharge Summary (Addendum)
Orthopedic Discharge Summary        Physician Discharge Summary  Patient ID: Timothy Osborn MRN: [id number] DOB/AGE: April 30, 1928 84 y.o.  Admit date: 03/15/2017 Discharge date: 03/16/2017   Procedures:  Procedure(s) (LRB): RIGHT REVERSE SHOULDER ARTHROPLASTY (Right)  Attending Physician:  Dr. Esmond Plants  Admission Diagnoses:  Right shoulder rotator cuff tear arthropathy  Discharge Diagnoses: same   Past Medical History:  Diagnosis Date  . Anxiety   . Arthritis   . BPH (benign prostatic hyperplasia)   . Cancer [facility])    bladder cancer  . Chronic back pain   . Depression   . GERD (gastroesophageal reflux disease)   . H/O hiatal hernia   . Headache    ocular migraine - rare occasions  (30 yrs ago)  . History of bladder cancer urologist-  dr Junious Silk   w/ recurrency  . History of kidney stones    last one removed 2018  . History of retinal detachment PARTIAL DETACH-- NO SURGICAL INTERVENTION--  RESOLVED   RIGHT EYE 2008/   LEFT 2012  . Hyperlipidemia   . Hypertension   . Hypogonadism male   . OCD (obsessive compulsive disorder)    "MILD"  . Osteoporosis    /OSTEOPENIA  . Renal calculus, left   . Renal neoplasm    left renal pelvic  . Spinal stenosis of lumbar region   . Wears glasses     PCP: Deland Pretty, MD   Discharged Condition: good  Hospital Course:  Patient underwent the above stated procedure on 03/15/2017. Patient tolerated the procedure well and brought to the recovery room in good condition and subsequently to the floor. Patient had an uncomplicated hospital course and was stable for discharge.   Disposition: 01-Home or Self Care with follow up in 2 weeks   Follow-up Information    Netta Cedars, MD. Call in 2 week(s).   Specialty:  Orthopedic Surgery Why:  [phone]  [phone] Contact information: [address] [place] [address] [phone]             Allergies as of 03/16/2017      Reactions   Prozac  [fluoxetine Hcl] Anxiety, Other (See Comments)   "Jittery"   Vicodin [hydrocodone-acetaminophen] Other (See Comments)   hallucinations      Medication List    TAKE these medications   aspirin EC 81 MG tablet Take 81 mg by mouth at bedtime.   b complex vitamins tablet Take 1 tablet by mouth daily with lunch.   CoQ10 200 MG Caps Take 200 mg by mouth daily.   ibuprofen 200 MG tablet Commonly known as:  ADVIL,MOTRIN Take 400 mg by mouth every 6 (six) hours as needed for headache or moderate pain.   losartan 100 MG tablet Commonly known as:  COZAAR Take 100 mg by mouth daily.   meloxicam 15 MG tablet Commonly known as:  MOBIC Take 15 mg by mouth every morning.   MIRALAX packet Generic drug:  polyethylene glycol Take 17 g by mouth daily as needed for moderate constipation.   multivitamin with minerals Tabs tablet Take 1 tablet by mouth daily. Centrum   omeprazole 40 MG capsule Commonly known as:  PRILOSEC Take 40 mg by mouth every morning.   oxyCODONE 5 MG immediate release tablet Commonly known as:  ROXICODONE Take 1 tablet (5 mg total) by mouth every 4 (four) hours as needed for severe pain.   PARoxetine 40 MG tablet Commonly known as:  PAXIL Take 40  mg by mouth every morning.   senna 8.6 MG Tabs tablet Commonly known as:  SENOKOT Take 1 tablet by mouth daily as needed for mild constipation.   simvastatin 20 MG tablet Commonly known as:  ZOCOR Take 20 mg by mouth every evening.   SYSTANE OP Apply 1 drop to eye daily as needed (for dry eyes).   traMADol 50 MG tablet Commonly known as:  ULTRAM Take 1 tablet (50 mg total) by mouth every 6 (six) hours as needed.         Signed: Augustin Schooling 03/18/2017, 10:50 AM  [facility] is now [employer] [address], [place], [place], [place] [address] Phone: [place]

## 2017-03-28 DIAGNOSIS — Z9660 Presence of unspecified orthopedic joint implant: Secondary | ICD-10-CM | POA: Diagnosis not present

## 2017-03-28 DIAGNOSIS — M25511 Pain in right shoulder: Secondary | ICD-10-CM | POA: Diagnosis not present

## 2017-03-28 DIAGNOSIS — Z4789 Encounter for other orthopedic aftercare: Secondary | ICD-10-CM | POA: Diagnosis not present

## 2017-04-25 DIAGNOSIS — Z4789 Encounter for other orthopedic aftercare: Secondary | ICD-10-CM | POA: Diagnosis not present

## 2017-04-25 DIAGNOSIS — Z96611 Presence of right artificial shoulder joint: Secondary | ICD-10-CM | POA: Diagnosis not present

## 2017-05-27 DIAGNOSIS — Z7982 Long term (current) use of aspirin: Secondary | ICD-10-CM | POA: Diagnosis not present

## 2017-05-27 DIAGNOSIS — M8580 Other specified disorders of bone density and structure, unspecified site: Secondary | ICD-10-CM | POA: Diagnosis not present

## 2017-05-27 DIAGNOSIS — E7800 Pure hypercholesterolemia, unspecified: Secondary | ICD-10-CM | POA: Diagnosis not present

## 2017-05-30 DIAGNOSIS — H9319 Tinnitus, unspecified ear: Secondary | ICD-10-CM | POA: Diagnosis not present

## 2017-05-30 DIAGNOSIS — J309 Allergic rhinitis, unspecified: Secondary | ICD-10-CM | POA: Diagnosis not present

## 2017-05-30 DIAGNOSIS — R432 Parageusia: Secondary | ICD-10-CM | POA: Diagnosis not present

## 2017-05-30 DIAGNOSIS — Z0001 Encounter for general adult medical examination with abnormal findings: Secondary | ICD-10-CM | POA: Diagnosis not present

## 2017-05-30 DIAGNOSIS — N200 Calculus of kidney: Secondary | ICD-10-CM | POA: Diagnosis not present

## 2017-05-30 DIAGNOSIS — E291 Testicular hypofunction: Secondary | ICD-10-CM | POA: Diagnosis not present

## 2017-05-30 DIAGNOSIS — I10 Essential (primary) hypertension: Secondary | ICD-10-CM | POA: Diagnosis not present

## 2017-05-30 DIAGNOSIS — K219 Gastro-esophageal reflux disease without esophagitis: Secondary | ICD-10-CM | POA: Diagnosis not present

## 2017-05-30 DIAGNOSIS — M810 Age-related osteoporosis without current pathological fracture: Secondary | ICD-10-CM | POA: Diagnosis not present

## 2017-05-30 DIAGNOSIS — E7800 Pure hypercholesterolemia, unspecified: Secondary | ICD-10-CM | POA: Diagnosis not present

## 2017-05-30 DIAGNOSIS — Z7982 Long term (current) use of aspirin: Secondary | ICD-10-CM | POA: Diagnosis not present

## 2017-06-17 DIAGNOSIS — M810 Age-related osteoporosis without current pathological fracture: Secondary | ICD-10-CM | POA: Diagnosis not present

## 2017-06-17 DIAGNOSIS — K219 Gastro-esophageal reflux disease without esophagitis: Secondary | ICD-10-CM | POA: Diagnosis not present

## 2017-06-27 DIAGNOSIS — M25511 Pain in right shoulder: Secondary | ICD-10-CM | POA: Diagnosis not present

## 2017-07-09 DIAGNOSIS — M810 Age-related osteoporosis without current pathological fracture: Secondary | ICD-10-CM | POA: Diagnosis not present

## 2017-07-16 DIAGNOSIS — Z961 Presence of intraocular lens: Secondary | ICD-10-CM | POA: Diagnosis not present

## 2017-07-16 DIAGNOSIS — H04123 Dry eye syndrome of bilateral lacrimal glands: Secondary | ICD-10-CM | POA: Diagnosis not present

## 2017-07-16 DIAGNOSIS — H43813 Vitreous degeneration, bilateral: Secondary | ICD-10-CM | POA: Diagnosis not present

## 2017-07-16 DIAGNOSIS — D3131 Benign neoplasm of right choroid: Secondary | ICD-10-CM | POA: Diagnosis not present

## 2017-07-31 DIAGNOSIS — L821 Other seborrheic keratosis: Secondary | ICD-10-CM | POA: Diagnosis not present

## 2017-07-31 DIAGNOSIS — L814 Other melanin hyperpigmentation: Secondary | ICD-10-CM | POA: Diagnosis not present

## 2017-07-31 DIAGNOSIS — Z85828 Personal history of other malignant neoplasm of skin: Secondary | ICD-10-CM | POA: Diagnosis not present

## 2017-07-31 DIAGNOSIS — D1801 Hemangioma of skin and subcutaneous tissue: Secondary | ICD-10-CM | POA: Diagnosis not present

## 2017-07-31 DIAGNOSIS — L819 Disorder of pigmentation, unspecified: Secondary | ICD-10-CM | POA: Diagnosis not present

## 2017-07-31 DIAGNOSIS — L218 Other seborrheic dermatitis: Secondary | ICD-10-CM | POA: Diagnosis not present

## 2017-07-31 DIAGNOSIS — L570 Actinic keratosis: Secondary | ICD-10-CM | POA: Diagnosis not present

## 2017-09-10 DIAGNOSIS — Z8551 Personal history of malignant neoplasm of bladder: Secondary | ICD-10-CM | POA: Diagnosis not present

## 2017-11-05 DIAGNOSIS — M791 Myalgia, unspecified site: Secondary | ICD-10-CM | POA: Diagnosis not present

## 2017-11-05 DIAGNOSIS — K219 Gastro-esophageal reflux disease without esophagitis: Secondary | ICD-10-CM | POA: Diagnosis not present

## 2017-11-05 DIAGNOSIS — R5382 Chronic fatigue, unspecified: Secondary | ICD-10-CM | POA: Diagnosis not present

## 2017-11-28 DIAGNOSIS — K219 Gastro-esophageal reflux disease without esophagitis: Secondary | ICD-10-CM | POA: Diagnosis not present

## 2017-11-28 DIAGNOSIS — F339 Major depressive disorder, recurrent, unspecified: Secondary | ICD-10-CM | POA: Diagnosis not present

## 2017-11-28 DIAGNOSIS — R5382 Chronic fatigue, unspecified: Secondary | ICD-10-CM | POA: Diagnosis not present

## 2018-06-02 DIAGNOSIS — E7800 Pure hypercholesterolemia, unspecified: Secondary | ICD-10-CM | POA: Diagnosis not present

## 2018-06-02 DIAGNOSIS — I10 Essential (primary) hypertension: Secondary | ICD-10-CM | POA: Diagnosis not present

## 2018-06-02 DIAGNOSIS — M810 Age-related osteoporosis without current pathological fracture: Secondary | ICD-10-CM | POA: Diagnosis not present

## 2018-06-05 DIAGNOSIS — K219 Gastro-esophageal reflux disease without esophagitis: Secondary | ICD-10-CM | POA: Diagnosis not present

## 2018-06-05 DIAGNOSIS — F339 Major depressive disorder, recurrent, unspecified: Secondary | ICD-10-CM | POA: Diagnosis not present

## 2018-06-05 DIAGNOSIS — L570 Actinic keratosis: Secondary | ICD-10-CM | POA: Diagnosis not present

## 2018-06-05 DIAGNOSIS — M810 Age-related osteoporosis without current pathological fracture: Secondary | ICD-10-CM | POA: Diagnosis not present

## 2018-06-05 DIAGNOSIS — F419 Anxiety disorder, unspecified: Secondary | ICD-10-CM | POA: Diagnosis not present

## 2018-06-05 DIAGNOSIS — Z0000 Encounter for general adult medical examination without abnormal findings: Secondary | ICD-10-CM | POA: Diagnosis not present

## 2018-06-05 DIAGNOSIS — Z87891 Personal history of nicotine dependence: Secondary | ICD-10-CM | POA: Diagnosis not present

## 2018-06-05 DIAGNOSIS — Z7982 Long term (current) use of aspirin: Secondary | ICD-10-CM | POA: Diagnosis not present

## 2018-06-05 DIAGNOSIS — I10 Essential (primary) hypertension: Secondary | ICD-10-CM | POA: Diagnosis not present

## 2018-07-24 ENCOUNTER — Inpatient Hospital Stay (HOSPITAL_COMMUNITY)
Admission: AD | Admit: 2018-07-24 | Discharge: 2018-07-26 | DRG: 082 | Disposition: A | Discharge status: Home / Self Care | Payer: PPO | Source: Other Acute Inpatient Hospital | Attending: General Surgery | Admitting: General Surgery

## 2018-07-24 ENCOUNTER — Inpatient Hospital Stay (HOSPITAL_COMMUNITY): Payer: PPO

## 2018-07-24 DIAGNOSIS — E785 Hyperlipidemia, unspecified: Secondary | ICD-10-CM | POA: Diagnosis present

## 2018-07-24 DIAGNOSIS — Z87442 Personal history of urinary calculi: Secondary | ICD-10-CM

## 2018-07-24 DIAGNOSIS — F429 Obsessive-compulsive disorder, unspecified: Secondary | ICD-10-CM | POA: Diagnosis not present

## 2018-07-24 DIAGNOSIS — Z96611 Presence of right artificial shoulder joint: Secondary | ICD-10-CM | POA: Diagnosis present

## 2018-07-24 DIAGNOSIS — W19XXXA Unspecified fall, initial encounter: Secondary | ICD-10-CM | POA: Diagnosis present

## 2018-07-24 DIAGNOSIS — Z888 Allergy status to other drugs, medicaments and biological substances status: Secondary | ICD-10-CM

## 2018-07-24 DIAGNOSIS — Z96652 Presence of left artificial knee joint: Secondary | ICD-10-CM | POA: Diagnosis present

## 2018-07-24 DIAGNOSIS — S065X9A Traumatic subdural hemorrhage with loss of consciousness of unspecified duration, initial encounter: Secondary | ICD-10-CM | POA: Diagnosis not present

## 2018-07-24 DIAGNOSIS — I10 Essential (primary) hypertension: Secondary | ICD-10-CM | POA: Diagnosis present

## 2018-07-24 DIAGNOSIS — D72810 Lymphocytopenia: Secondary | ICD-10-CM | POA: Diagnosis present

## 2018-07-24 DIAGNOSIS — G9389 Other specified disorders of brain: Secondary | ICD-10-CM | POA: Diagnosis not present

## 2018-07-24 DIAGNOSIS — Z7982 Long term (current) use of aspirin: Secondary | ICD-10-CM

## 2018-07-24 DIAGNOSIS — S40012A Contusion of left shoulder, initial encounter: Secondary | ICD-10-CM | POA: Diagnosis not present

## 2018-07-24 DIAGNOSIS — M1990 Unspecified osteoarthritis, unspecified site: Secondary | ICD-10-CM | POA: Diagnosis not present

## 2018-07-24 DIAGNOSIS — Z9842 Cataract extraction status, left eye: Secondary | ICD-10-CM

## 2018-07-24 DIAGNOSIS — N400 Enlarged prostate without lower urinary tract symptoms: Secondary | ICD-10-CM | POA: Diagnosis not present

## 2018-07-24 DIAGNOSIS — F329 Major depressive disorder, single episode, unspecified: Secondary | ICD-10-CM | POA: Diagnosis present

## 2018-07-24 DIAGNOSIS — G8929 Other chronic pain: Secondary | ICD-10-CM | POA: Diagnosis present

## 2018-07-24 DIAGNOSIS — Z043 Encounter for examination and observation following other accident: Secondary | ICD-10-CM | POA: Diagnosis not present

## 2018-07-24 DIAGNOSIS — S01112A Laceration without foreign body of left eyelid and periocular area, initial encounter: Secondary | ICD-10-CM | POA: Diagnosis not present

## 2018-07-24 DIAGNOSIS — Z8551 Personal history of malignant neoplasm of bladder: Secondary | ICD-10-CM

## 2018-07-24 DIAGNOSIS — W1830XA Fall on same level, unspecified, initial encounter: Secondary | ICD-10-CM | POA: Diagnosis present

## 2018-07-24 DIAGNOSIS — S065X1A Traumatic subdural hemorrhage with loss of consciousness of 30 minutes or less, initial encounter: Secondary | ICD-10-CM | POA: Diagnosis not present

## 2018-07-24 DIAGNOSIS — Y92009 Unspecified place in unspecified non-institutional (private) residence as the place of occurrence of the external cause: Secondary | ICD-10-CM | POA: Diagnosis not present

## 2018-07-24 DIAGNOSIS — I639 Cerebral infarction, unspecified: Secondary | ICD-10-CM | POA: Diagnosis not present

## 2018-07-24 DIAGNOSIS — I619 Nontraumatic intracerebral hemorrhage, unspecified: Secondary | ICD-10-CM | POA: Diagnosis present

## 2018-07-24 DIAGNOSIS — K219 Gastro-esophageal reflux disease without esophagitis: Secondary | ICD-10-CM | POA: Diagnosis not present

## 2018-07-24 DIAGNOSIS — Z961 Presence of intraocular lens: Secondary | ICD-10-CM | POA: Diagnosis present

## 2018-07-24 DIAGNOSIS — S0181XA Laceration without foreign body of other part of head, initial encounter: Secondary | ICD-10-CM | POA: Diagnosis not present

## 2018-07-24 DIAGNOSIS — Z23 Encounter for immunization: Secondary | ICD-10-CM | POA: Diagnosis not present

## 2018-07-24 DIAGNOSIS — S80212A Abrasion, left knee, initial encounter: Secondary | ICD-10-CM | POA: Diagnosis present

## 2018-07-24 DIAGNOSIS — Z87891 Personal history of nicotine dependence: Secondary | ICD-10-CM

## 2018-07-24 DIAGNOSIS — Z8249 Family history of ischemic heart disease and other diseases of the circulatory system: Secondary | ICD-10-CM

## 2018-07-24 DIAGNOSIS — F419 Anxiety disorder, unspecified: Secondary | ICD-10-CM | POA: Diagnosis present

## 2018-07-24 DIAGNOSIS — I498 Other specified cardiac arrhythmias: Secondary | ICD-10-CM | POA: Diagnosis not present

## 2018-07-24 DIAGNOSIS — S299XXA Unspecified injury of thorax, initial encounter: Secondary | ICD-10-CM | POA: Diagnosis not present

## 2018-07-24 DIAGNOSIS — S0990XA Unspecified injury of head, initial encounter: Secondary | ICD-10-CM | POA: Diagnosis not present

## 2018-07-24 DIAGNOSIS — Z79899 Other long term (current) drug therapy: Secondary | ICD-10-CM

## 2018-07-24 DIAGNOSIS — U071 COVID-19: Secondary | ICD-10-CM | POA: Diagnosis not present

## 2018-07-24 DIAGNOSIS — Z79891 Long term (current) use of opiate analgesic: Secondary | ICD-10-CM

## 2018-07-24 DIAGNOSIS — Z9841 Cataract extraction status, right eye: Secondary | ICD-10-CM | POA: Diagnosis not present

## 2018-07-24 DIAGNOSIS — M25512 Pain in left shoulder: Secondary | ICD-10-CM | POA: Diagnosis not present

## 2018-07-24 DIAGNOSIS — Z791 Long term (current) use of non-steroidal anti-inflammatories (NSAID): Secondary | ICD-10-CM

## 2018-07-24 DIAGNOSIS — Z885 Allergy status to narcotic agent status: Secondary | ICD-10-CM

## 2018-07-24 DIAGNOSIS — M810 Age-related osteoporosis without current pathological fracture: Secondary | ICD-10-CM | POA: Diagnosis not present

## 2018-07-24 DIAGNOSIS — R509 Fever, unspecified: Secondary | ICD-10-CM | POA: Diagnosis not present

## 2018-07-24 DIAGNOSIS — S065X0A Traumatic subdural hemorrhage without loss of consciousness, initial encounter: Secondary | ICD-10-CM | POA: Diagnosis not present

## 2018-07-24 DIAGNOSIS — S4992XA Unspecified injury of left shoulder and upper arm, initial encounter: Secondary | ICD-10-CM | POA: Diagnosis not present

## 2018-07-24 LAB — COMPREHENSIVE METABOLIC PANEL
ALT: 21 U/L (ref 0–44)
AST: 33 U/L (ref 15–41)
Albumin: 3.1 g/dL — ABNORMAL LOW (ref 3.5–5.0)
Alkaline Phosphatase: 73 U/L (ref 38–126)
Anion gap: 10 (ref 5–15)
BUN: 15 mg/dL (ref 8–23)
CO2: 26 mmol/L (ref 22–32)
Calcium: 8.2 mg/dL — ABNORMAL LOW (ref 8.9–10.3)
Chloride: 101 mmol/L (ref 98–111)
Creatinine, Ser: 0.91 mg/dL (ref 0.61–1.24)
GFR calc Af Amer: >60 mL/min (ref >60)
GFR calc non Af Amer: >60 mL/min (ref >60)
Glucose, Bld: 97 mg/dL (ref 70–99)
Potassium: 3.6 mmol/L (ref 3.5–5.1)
Sodium: 137 mmol/L (ref 135–145)
Total Bilirubin: 0.9 mg/dL (ref 0.3–1.2)
Total Protein: 6.2 g/dL — ABNORMAL LOW (ref 6.5–8.1)

## 2018-07-24 LAB — CBC
HCT: 44.3 % (ref 39.0–52.0)
Hemoglobin: 14.6 g/dL (ref 13.0–17.0)
MCH: 30.3 pg (ref 26.0–34.0)
MCHC: 33 g/dL (ref 30.0–36.0)
MCV: 91.9 fL (ref 80.0–100.0)
Platelets: 180 10*3/uL (ref 150–400)
RBC: 4.82 MIL/uL (ref 4.22–5.81)
RDW: 14.6 % (ref 11.5–15.5)
WBC: 4.2 10*3/uL (ref 4.0–10.5)
nRBC: 0 % (ref 0.0–0.2)

## 2018-07-24 LAB — FERRITIN: Ferritin: 60 ng/mL (ref 24–336)

## 2018-07-24 LAB — PROCALCITONIN: Procalcitonin: <0.1 ng/mL

## 2018-07-24 LAB — SEDIMENTATION RATE: Sed Rate: 12 mm/hr (ref 0–16)

## 2018-07-24 LAB — LACTATE DEHYDROGENASE: LDH: 183 U/L (ref 98–192)

## 2018-07-24 LAB — D-DIMER, QUANTITATIVE: D-Dimer, Quant: 0.7 ug/mL-FEU — ABNORMAL HIGH (ref 0.00–0.50)

## 2018-07-24 LAB — PHOSPHORUS: Phosphorus: 2.7 mg/dL (ref 2.5–4.6)

## 2018-07-24 LAB — MAGNESIUM: Magnesium: 2.1 mg/dL (ref 1.7–2.4)

## 2018-07-24 LAB — LACTIC ACID, PLASMA: Lactic Acid, Venous: 0.9 mmol/L (ref 0.5–1.9)

## 2018-07-24 LAB — MRSA PCR SCREENING: MRSA by PCR: NEGATIVE

## 2018-07-24 LAB — C-REACTIVE PROTEIN: CRP: 4.7 mg/dL — ABNORMAL HIGH (ref <1.0)

## 2018-07-24 LAB — BRAIN NATRIURETIC PEPTIDE: B Natriuretic Peptide: 38 pg/mL (ref 0.0–100.0)

## 2018-07-24 MED ORDER — SIMVASTATIN 20 MG PO TABS
20.0000 mg | ORAL_TABLET | Freq: Every evening | ORAL | Status: DC
  Administered 2018-07-24 – 2018-07-25 (×2): 20 mg via ORAL
  Filled 2018-07-24 (×3): qty 1

## 2018-07-24 MED ORDER — CHLORHEXIDINE GLUCONATE CLOTH 2 % EX PADS
6.0000 | MEDICATED_PAD | Freq: Every day | CUTANEOUS | Status: DC
  Administered 2018-07-25: 6 via TOPICAL

## 2018-07-24 MED ORDER — PANTOPRAZOLE SODIUM 40 MG IV SOLR: 40.0000 mg | Freq: Every day | INTRAVENOUS | Status: DC

## 2018-07-24 MED ORDER — ONDANSETRON HCL 4 MG/2ML IJ SOLN: 4.0000 mg | Freq: Four times a day (QID) | INTRAMUSCULAR | Status: DC | PRN

## 2018-07-24 MED ORDER — IRBESARTAN 150 MG PO TABS
150.0000 mg | ORAL_TABLET | Freq: Every day | ORAL | Status: DC
  Administered 2018-07-24 – 2018-07-25 (×2): 150 mg via ORAL
  Filled 2018-07-24 (×3): qty 1

## 2018-07-24 MED ORDER — MORPHINE SULFATE (PF) 2 MG/ML IV SOLN: 1.0000 mg | INTRAVENOUS | Status: DC | PRN

## 2018-07-24 MED ORDER — POTASSIUM CHLORIDE IN NACL 20-0.9 MEQ/L-% IV SOLN
INTRAVENOUS | Status: DC
  Administered 2018-07-24 – 2018-07-25 (×3): via INTRAVENOUS
  Filled 2018-07-24 (×4): qty 1000

## 2018-07-24 MED ORDER — ORAL CARE MOUTH RINSE
15.0000 mL | Freq: Two times a day (BID) | OROMUCOSAL | Status: DC
  Administered 2018-07-24 – 2018-07-25 (×3): 15 mL via OROMUCOSAL

## 2018-07-24 MED ORDER — TRAMADOL HCL 50 MG PO TABS: 50.0000 mg | ORAL_TABLET | Freq: Four times a day (QID) | ORAL | Status: DC | PRN

## 2018-07-24 MED ORDER — DOCUSATE SODIUM 100 MG PO CAPS
100.0000 mg | ORAL_CAPSULE | Freq: Two times a day (BID) | ORAL | Status: DC
  Administered 2018-07-24 – 2018-07-25 (×3): 100 mg via ORAL
  Filled 2018-07-24 (×3): qty 1

## 2018-07-24 MED ORDER — LOSARTAN POTASSIUM 50 MG PO TABS: 100.0000 mg | ORAL_TABLET | Freq: Every day | ORAL | Status: DC

## 2018-07-24 MED ORDER — CHLORHEXIDINE GLUCONATE 0.12 % MT SOLN
15.0000 mL | Freq: Two times a day (BID) | OROMUCOSAL | Status: DC
  Administered 2018-07-25 (×2): 15 mL via OROMUCOSAL
  Filled 2018-07-24: qty 15

## 2018-07-24 MED ORDER — POLYETHYL GLYCOL-PROPYL GLYCOL 0.4-0.3 % OP GEL: Freq: Every day | OPHTHALMIC | Status: DC | PRN

## 2018-07-24 MED ORDER — HYDRALAZINE HCL 20 MG/ML IJ SOLN: 10.0000 mg | INTRAMUSCULAR | Status: DC | PRN

## 2018-07-24 MED ORDER — POLYETHYLENE GLYCOL 3350 17 G PO PACK: 17.0000 g | PACK | Freq: Every day | ORAL | Status: DC | PRN

## 2018-07-24 MED ORDER — PAROXETINE HCL 20 MG PO TABS: 40.0000 mg | ORAL_TABLET | Freq: Every morning | ORAL | Status: DC

## 2018-07-24 MED ORDER — ACETAMINOPHEN 325 MG PO TABS: 650.0000 mg | ORAL_TABLET | ORAL | Status: DC | PRN

## 2018-07-24 MED ORDER — ORAL CARE MOUTH RINSE: 15.0000 mL | Freq: Two times a day (BID) | OROMUCOSAL | Status: DC

## 2018-07-24 MED ORDER — POLYVINYL ALCOHOL 1.4 % OP SOLN
1.0000 [drp] | OPHTHALMIC | Status: DC | PRN
  Filled 2018-07-24: qty 15

## 2018-07-24 MED ORDER — PANTOPRAZOLE SODIUM 40 MG PO TBEC
40.0000 mg | DELAYED_RELEASE_TABLET | Freq: Every day | ORAL | Status: DC
  Administered 2018-07-24 – 2018-07-25 (×2): 40 mg via ORAL
  Filled 2018-07-24 (×2): qty 1

## 2018-07-24 MED ORDER — PAROXETINE HCL 30 MG PO TABS
30.0000 mg | ORAL_TABLET | Freq: Every day | ORAL | Status: DC
  Administered 2018-07-25: 30 mg via ORAL
  Filled 2018-07-24 (×2): qty 1

## 2018-07-24 MED ORDER — ONDANSETRON 4 MG PO TBDP
4.0000 mg | ORAL_TABLET | Freq: Four times a day (QID) | ORAL | Status: DC | PRN
  Filled 2018-07-24: qty 1

## 2018-07-24 NOTE — Consult Note (Signed)
NAME:  Timothy Osborn, MRN:  [id number], DOB:  03-09-1928, LOS: 0 ADMISSION DATE:  07/24/2018, CONSULTATION DATE:  07/24/2018 REFERRING MD:  Trauma services, CHIEF COMPLAINT:  Intraparenchymal hemorrhage, COVID 62 confirmed    Brief History   85 yo male with traumatic IPH incidentally COVID 19 positive admitted to MICU.   History of present illness   Timothy Osborn is an 85 yo male who presented to [facility] ED after he sustained a fall. He reported to staff that he got up to use the bathroom when his spouse found him on the floor with loss of consciousness. He had a laceration above left eye. W/u in ED revealed L frontal lobe IPH measuring up to 83mm and R frontal region with small SDH. Incidentally the pt c/o fever and not feeling well since Sunday 6/21 and had planned to see his PCP d/t same. COVID 19 test positive in ED. Labs notable for lymphopenia. He was accepted in transfer to [facility] by trauma services. PCCM consulted for COVID 19.  Past Medical History  Anxiety/Depression Chronic pain GERD H/O hiatal hernia H/O bladder Ca  HLD OCD, mild   Significant Hospital Events   Transferred to [facility] from [facility] ED    Consults:  PCCM   Procedures:  NA  Significant Diagnostic Tests:  6/25: OSH imaging report: "CT head 1.Small regions of intraparenchymal hemorrhage of the subcortical left frontal lobe with hemorrhage measuring up to 7 mm.   2. Focal 3 mm area of increased density in the right high frontal region likely represents a small focal subdural hemorrhage.  3. Skin/subcutaneous hemorrhage, edema or laceration of the left orbital/anterior temporal region."  Micro Data:  COVID 19 positive  Antimicrobials:  Not indicated.  Interim history/subjective:  Arrived 68M ICU.  No complaints.   Objective   There were no vitals taken for this visit.       No intake or output data in the 24 hours ending 07/24/18 1425 There were no vitals filed for this visit. VS  pending  Examination: General: elderly, well developed, well nourished HENT: Normocephalic. L orbital/anterior temporal area surface laceration approx 1.5 cm with dried blood. PERRL. MMM Lungs: BB present, clear, FNL, symmetrical Cardiovascular: RRR, S1S2. No MRG. Trace edema to lower EXT Abdomen: Flat. + BX 4. SNT/ND Extremities: MAE well. Old areas of ecchymosis on b/l upper EXT  Neuro: A&Ox4. CN II-XII grossly in tact   Resolved Hospital Problem list   NA  Assessment & Plan:   Acute traumatic IPH P: -Per trauma  COVID 19 confirmed  P: -CXR now -Follow biomarkers -monitor oxygenation status -supportive care in ICU   All other medical problems per primary team.   Best practice:  Diet: per trauma Pain/Anxiety/Delirium protocol (if indicated): recommend prn VAP protocol (if indicated): not indicated DVT prophylaxis: contraindicated d/t IPH GI prophylaxis: per primary  Glucose control: per primary  Mobility: per primary  Code Status: per primary  Family Communication: per primary  Disposition: ICU. PCCM will continue to follow.   Labs   CBC: No results for input(s): WBC, NEUTROABS, HGB, HCT, MCV, PLT in the last 168 hours.  Basic Metabolic Panel: No results for input(s): NA, K, CL, CO2, GLUCOSE, BUN, CREATININE, CALCIUM, MG, PHOS in the last 168 hours. GFR: CrCl cannot be calculated (Patient's most recent lab result is older than the maximum 21 days allowed.). No results for input(s): PROCALCITON, WBC, LATICACIDVEN in the last 168 hours.  Liver Function Tests: No results for input(s): AST,  ALT, ALKPHOS, BILITOT, PROT, ALBUMIN in the last 168 hours. No results for input(s): LIPASE, AMYLASE in the last 168 hours. No results for input(s): AMMONIA in the last 168 hours.  ABG No results found for: PHART, PCO2ART, PO2ART, HCO3, TCO2, ACIDBASEDEF, O2SAT   Coagulation Profile: No results for input(s): INR, PROTIME in the last 168 hours.  Cardiac Enzymes: No  results for input(s): CKTOTAL, CKMB, CKMBINDEX, TROPONINI in the last 168 hours.  HbA1C: No results found for: HGBA1C  CBG: No results for input(s): GLUCAP in the last 168 hours.  Review of Systems: Positives in bold   Gen: Denies fever, chills, weight change, fatigue, night sweats HEENT: Denies blurred vision, double vision, hearing loss, tinnitus, sinus congestion, rhinorrhea, sore throat, neck stiffness, dysphagia PULM: Denies shortness of breath, cough, sputum production, hemoptysis, wheezing CV: Denies chest pain, edema, orthopnea, paroxysmal nocturnal dyspnea, palpitations GI: Denies abdominal pain, nausea, vomiting, diarrhea, hematochezia, melena, constipation, change in bowel habits GU: Denies dysuria, hematuria, polyuria, oliguria, urethral discharge Endocrine: Denies hot or cold intolerance, polyuria, polyphagia or appetite change Derm: Denies rash, dry skin, scaling or peeling skin change Heme: Denies easy bruising, bleeding, bleeding gums Neuro: Denies headache, numbness, weakness, slurred speech, loss of consciousness   Past Medical History  He,  has a past medical history of Anxiety, Arthritis, BPH (benign prostatic hyperplasia), Cancer ([place]), Chronic back pain, Depression, GERD (gastroesophageal reflux disease), H/O hiatal hernia, Headache, History of bladder cancer (urologist-  dr Junious Silk), History of kidney stones, History of retinal detachment (PARTIAL DETACH-- NO SURGICAL INTERVENTION--  RESOLVED), Hyperlipidemia, Hypertension, Hypogonadism male, OCD (obsessive compulsive disorder), Osteoporosis, Renal calculus, left, Renal neoplasm, Spinal stenosis of lumbar region, and Wears glasses.   Surgical History    Past Surgical History:  Procedure Laterality Date  . BACK SURGERY    . CATARACT EXTRACTION W/ INTRAOCULAR LENS  IMPLANT, BILATERAL    . COLONOSCOPY    . CYSTOSCOPY W/ RETROGRADES Left 11/08/2015   Procedure: CYSTOSCOPY WITH RETROGRADE PYELOGRAM, URETEROSCOPY,  STENT PLACEMENT;  Surgeon: Festus Aloe, MD;  Location: [facility];  Service: Urology;  Laterality: Left;  . CYSTOSCOPY WITH BIOPSY N/A 03/13/2013   Procedure: CYSTOSCOPY WITH  BLADDER BIOPSY MITOMYCIN - C;  Surgeon: Fredricka Bonine, MD;  Location: [facility];  Service: Urology;  Laterality: N/A;  . CYSTOSCOPY/URETEROSCOPY/HOLMIUM LASER/STENT PLACEMENT N/A 12/02/2015   Procedure: CYSTOSCOPY/URETEROSCOPY/HOLMIUM LASER/STENT PLACEMENT POSSIBLE BIOPSY;  Surgeon: Festus Aloe, MD;  Location: [facility];  Service: Urology;  Laterality: N/A;  . EYE SURGERY    . INGUINAL HERNIA REPAIR Left   . JOINT REPLACEMENT    . LUMBAR LAMINECTOMY/DECOMPRESSION MICRODISCECTOMY  08/29/2011   Procedure: LUMBAR LAMINECTOMY/DECOMPRESSION MICRODISCECTOMY 2 LEVELS;  Surgeon: Eustace Moore, MD;  Location: [place] [facility];  Service: Neurosurgery;  Laterality: N/A;  lumbar three-five lumbar laminectomy   . LUMBAR WOUND DEBRIDEMENT N/A 06/16/2012   Procedure: LUMBAR WOUND DEBRIDEMENT;  Surgeon: Eustace Moore, MD;  Location: [place] [facility];  Service: Neurosurgery;  Laterality: N/A;  Lumbar Wound Irrigation and debridment  . REVERSE SHOULDER ARTHROPLASTY Right 03/15/2017   Procedure: RIGHT REVERSE SHOULDER ARTHROPLASTY;  Surgeon: Netta Cedars, MD;  Location: [place];  Service: Orthopedics;  Laterality: Right;  . SHOULDER ARTHROSCOPY WITH ROTATOR CUFF REPAIR Right 07/2013  . TONSILLECTOMY  AS CHILD  . TOTAL KNEE ARTHROPLASTY  12/18/2010   Procedure: TOTAL KNEE ARTHROPLASTY;  Surgeon: Lorn Junes, MD;  Location: [place];  Service: Orthopedics;  Laterality: Left;  ARTHROPLASTY KNEE TOTAL  LEFT SIDE  . TRANSURETHRAL RESECTION OF BLADDER TUMOR N/A 05/06/2012   Procedure: TRANSURETHRAL RESECTION OF BLADDER TUMOR (TURBT) WITH INSTILATION OF MYTOMIACIN C;  Surgeon: Fredricka Bonine, MD;  Location: [facility];  Service: Urology;  Laterality: N/A;     Social  History   reports that he quit smoking about 31 years ago. His smoking use included pipe. He has a 8.00 pack-year smoking history. He has never used smokeless tobacco. He reports current alcohol use of about 7.0 standard drinks of alcohol per week. He reports that he does not use drugs.   Family History   His family history includes Heart attack in his brother; Heart failure in his father.   Allergies Allergies  Allergen Reactions  . Prozac [Fluoxetine Hcl] Anxiety and Other (See Comments)    "Jittery"  . Vicodin [Hydrocodone-Acetaminophen] Other (See Comments)    hallucinations     Home Medications  Prior to Admission medications   Medication Sig Start Date End Date Taking? Authorizing Provider  aspirin EC 81 MG tablet Take 81 mg by mouth at bedtime.     [provider]  b complex vitamins tablet Take 1 tablet by mouth daily with lunch.    [provider]  Coenzyme Q10 (COQ10) 200 MG CAPS Take 200 mg by mouth daily.     [provider]  ibuprofen (ADVIL,MOTRIN) 200 MG tablet Take 400 mg by mouth every 6 (six) hours as needed for headache or moderate pain.     [provider]  losartan (COZAAR) 100 MG tablet Take 100 mg by mouth daily.    [provider]  meloxicam (MOBIC) 15 MG tablet Take 15 mg by mouth every morning.    [provider]  Multiple Vitamin (MULTIVITAMIN WITH MINERALS) TABS Take 1 tablet by mouth daily. Centrum     [provider]  omeprazole (PRILOSEC) 40 MG capsule Take 40 mg by mouth every morning.     Seward Carol, MD  oxyCODONE (ROXICODONE) 5 MG immediate release tablet Take 1 tablet (5 mg total) by mouth every 4 (four) hours as needed for severe pain. 03/15/17   Netta Cedars, MD  PARoxetine (PAXIL) 40 MG tablet Take 40 mg by mouth every morning.     Seward Carol, MD  Polyethyl Glycol-Propyl Glycol (SYSTANE OP) Apply 1 drop to eye daily as needed (for dry eyes).     [provider]   polyethylene glycol (MIRALAX) packet Take 17 g by mouth daily as needed for moderate constipation.     [provider]  senna (SENOKOT) 8.6 MG TABS tablet Take 1 tablet by mouth daily as needed for mild constipation.    [provider]  simvastatin (ZOCOR) 20 MG tablet Take 20 mg by mouth every evening.     Seward Carol, MD  traMADol (ULTRAM) 50 MG tablet Take 1 tablet (50 mg total) by mouth every 6 (six) hours as needed. Patient not taking: Reported on 02/26/2017 12/02/15   Festus Aloe, MD     Critical care time: NA    Francine Graven, MSN, [facility]

## 2018-07-24 NOTE — H&P (Signed)
[facility] Admission Note  Timothy Osborn Nov 02, 1928  [id number].    Requesting MD: Lona Kettle Chief Complaint/Reason for Consult: Fall  HPI:  Timothy Osborn is an 85yo male PMH HTN, HLD, GERD, osteoarthritis, anxiety/depression, h/o bladder cancer, who was transferred from [facility] ED to [facility] for admission to the trauma service after suffering a ground level fall at home. States that he was in the bathroom standing up to urinate when he fell and hit his head. +LOC but unsure for how long. When he awoke his wife was helping to pull him out of the bathroom. Complains of mild headache, laceration near left eye. Denies blurry vision, diplopia, nausea, vomiting, neck pain, back pain, or pain in any extremity. He does have an abrasion on the left knee and bruise on the left shoulder.   ED workup included CT head which revealed small regions of intraparenchymal hemorrhage of the subcortical left frontal lobe with hemorrhage measuring up to 7 mm; gocal 3 mm area of increased density in the right high frontal region likely represents a small focal subdural hemorrhage. CXR shows no pneumothorax, mild chronic appearing interstitial and central bronchitic changes in both lungs without focal infiltrate or consolidation. Covid test positive.  Prior to going to the ED he does report having a fever up to 102 on 6/21 and feeling tired. Denies chest pain, shortness or breath, or cough. No known sick contacts.   Anticoagulants: none Quit smoking 30 years ago Lives at home with his wife Ambulates with cane PRN  ROS: Review of Systems  Constitutional: Positive for fever and malaise/fatigue.  HENT: Negative.   Eyes: Negative for blurred vision, double vision and pain.  Respiratory: Negative.  Negative for cough, shortness of breath and wheezing.   Cardiovascular: Negative.  Negative for chest pain.  Gastrointestinal: Negative.  Negative for abdominal pain, nausea and vomiting.   Genitourinary: Negative.  Negative for dysuria.  Musculoskeletal: Positive for joint pain.       Left shoulder pain  Skin: Negative.   Neurological: Positive for loss of consciousness and headaches. Negative for sensory change and focal weakness.  Endo/Heme/Allergies: Bruises/bleeds easily.  Psychiatric/Behavioral: Negative.    All systems reviewed and otherwise negative except for as above  Family History  Problem Relation Age of Onset  . Heart failure Father   . Heart attack Brother     Past Medical History:  Diagnosis Date  . Anxiety   . Arthritis   . BPH (benign prostatic hyperplasia)   . Cancer [facility])    bladder cancer  . Chronic back pain   . Depression   . GERD (gastroesophageal reflux disease)   . H/O hiatal hernia   . Headache    ocular migraine - rare occasions  (30 yrs ago)  . History of bladder cancer urologist-  dr Junious Silk   w/ recurrency  . History of kidney stones    last one removed 2018  . History of retinal detachment PARTIAL DETACH-- NO SURGICAL INTERVENTION--  RESOLVED   RIGHT EYE 2008/   LEFT 2012  . Hyperlipidemia   . Hypertension   . Hypogonadism male   . OCD (obsessive compulsive disorder)    "MILD"  . Osteoporosis    /OSTEOPENIA  . Renal calculus, left   . Renal neoplasm    left renal pelvic  . Spinal stenosis of lumbar region   . Wears glasses     Past Surgical History:  Procedure Laterality Date  . BACK SURGERY    .  CATARACT EXTRACTION W/ INTRAOCULAR LENS  IMPLANT, BILATERAL    . COLONOSCOPY    . CYSTOSCOPY W/ RETROGRADES Left 11/08/2015   Procedure: CYSTOSCOPY WITH RETROGRADE PYELOGRAM, URETEROSCOPY, STENT PLACEMENT;  Surgeon: Festus Aloe, MD;  Location: [facility];  Service: Urology;  Laterality: Left;  . CYSTOSCOPY WITH BIOPSY N/A 03/13/2013   Procedure: CYSTOSCOPY WITH  BLADDER BIOPSY MITOMYCIN - C;  Surgeon: Fredricka Bonine, MD;  Location: [facility];  Service: Urology;  Laterality:  N/A;  . CYSTOSCOPY/URETEROSCOPY/HOLMIUM LASER/STENT PLACEMENT N/A 12/02/2015   Procedure: CYSTOSCOPY/URETEROSCOPY/HOLMIUM LASER/STENT PLACEMENT POSSIBLE BIOPSY;  Surgeon: Festus Aloe, MD;  Location: [facility];  Service: Urology;  Laterality: N/A;  . EYE SURGERY    . INGUINAL HERNIA REPAIR Left   . JOINT REPLACEMENT    . LUMBAR LAMINECTOMY/DECOMPRESSION MICRODISCECTOMY  08/29/2011   Procedure: LUMBAR LAMINECTOMY/DECOMPRESSION MICRODISCECTOMY 2 LEVELS;  Surgeon: Eustace Moore, MD;  Location: [place] [facility];  Service: Neurosurgery;  Laterality: N/A;  lumbar three-five lumbar laminectomy   . LUMBAR WOUND DEBRIDEMENT N/A 06/16/2012   Procedure: LUMBAR WOUND DEBRIDEMENT;  Surgeon: Eustace Moore, MD;  Location: [place] [facility];  Service: Neurosurgery;  Laterality: N/A;  Lumbar Wound Irrigation and debridment  . REVERSE SHOULDER ARTHROPLASTY Right 03/15/2017   Procedure: RIGHT REVERSE SHOULDER ARTHROPLASTY;  Surgeon: Netta Cedars, MD;  Location: [place];  Service: Orthopedics;  Laterality: Right;  . SHOULDER ARTHROSCOPY WITH ROTATOR CUFF REPAIR Right 07/2013  . TONSILLECTOMY  AS CHILD  . TOTAL KNEE ARTHROPLASTY  12/18/2010   Procedure: TOTAL KNEE ARTHROPLASTY;  Surgeon: Lorn Junes, MD;  Location: [place];  Service: Orthopedics;  Laterality: Left;  ARTHROPLASTY KNEE TOTAL LEFT SIDE  . TRANSURETHRAL RESECTION OF BLADDER TUMOR N/A 05/06/2012   Procedure: TRANSURETHRAL RESECTION OF BLADDER TUMOR (TURBT) WITH INSTILATION OF MYTOMIACIN C;  Surgeon: Fredricka Bonine, MD;  Location: [facility];  Service: Urology;  Laterality: N/A;    Social History:  reports that he quit smoking about 31 years ago. His smoking use included pipe. He has a 8.00 pack-year smoking history. He has never used smokeless tobacco. He reports current alcohol use of about 7.0 standard drinks of alcohol per week. He reports that he does not use drugs.  Allergies:  Allergies  Allergen Reactions  .  Prozac [Fluoxetine Hcl] Anxiety and Other (See Comments)    "Jittery"  . Vicodin [Hydrocodone-Acetaminophen] Other (See Comments)    hallucinations    Medications Prior to Admission  Medication Sig Dispense Refill  . aspirin EC 81 MG tablet Take 81 mg by mouth at bedtime.     Marland Kitchen b complex vitamins tablet Take 1 tablet by mouth daily with lunch.    . Coenzyme Q10 (COQ10) 200 MG CAPS Take 200 mg by mouth daily.     Marland Kitchen ibuprofen (ADVIL,MOTRIN) 200 MG tablet Take 400 mg by mouth every 6 (six) hours as needed for headache or moderate pain.     Marland Kitchen losartan (COZAAR) 100 MG tablet Take 100 mg by mouth daily.    . meloxicam (MOBIC) 15 MG tablet Take 15 mg by mouth every morning.    . Multiple Vitamin (MULTIVITAMIN WITH MINERALS) TABS Take 1 tablet by mouth daily. Centrum     . omeprazole (PRILOSEC) 40 MG capsule Take 40 mg by mouth every morning.     Marland Kitchen oxyCODONE (ROXICODONE) 5 MG immediate release tablet Take 1 tablet (5 mg total) by mouth every 4 (four) hours as needed for severe pain.  30 tablet 0  . PARoxetine (PAXIL) 40 MG tablet Take 40 mg by mouth every morning.     Vladimir Faster Glycol-Propyl Glycol (SYSTANE OP) Apply 1 drop to eye daily as needed (for dry eyes).     . polyethylene glycol (MIRALAX) packet Take 17 g by mouth daily as needed for moderate constipation.     . senna (SENOKOT) 8.6 MG TABS tablet Take 1 tablet by mouth daily as needed for mild constipation.    . simvastatin (ZOCOR) 20 MG tablet Take 20 mg by mouth every evening.     . traMADol (ULTRAM) 50 MG tablet Take 1 tablet (50 mg total) by mouth every 6 (six) hours as needed. (Patient not taking: Reported on 02/26/2017) 20 tablet 0    Prior to Admission medications   Medication Sig Start Date End Date Taking? Authorizing Provider  aspirin EC 81 MG tablet Take 81 mg by mouth at bedtime.     [provider]  b complex vitamins tablet Take 1 tablet by mouth daily with lunch.    [provider]  Coenzyme Q10 (COQ10)  200 MG CAPS Take 200 mg by mouth daily.     [provider]  ibuprofen (ADVIL,MOTRIN) 200 MG tablet Take 400 mg by mouth every 6 (six) hours as needed for headache or moderate pain.     [provider]  losartan (COZAAR) 100 MG tablet Take 100 mg by mouth daily.    [provider]  meloxicam (MOBIC) 15 MG tablet Take 15 mg by mouth every morning.    [provider]  Multiple Vitamin (MULTIVITAMIN WITH MINERALS) TABS Take 1 tablet by mouth daily. Centrum     [provider]  omeprazole (PRILOSEC) 40 MG capsule Take 40 mg by mouth every morning.     Seward Carol, MD  oxyCODONE (ROXICODONE) 5 MG immediate release tablet Take 1 tablet (5 mg total) by mouth every 4 (four) hours as needed for severe pain. 03/15/17   Netta Cedars, MD  PARoxetine (PAXIL) 40 MG tablet Take 40 mg by mouth every morning.     Seward Carol, MD  Polyethyl Glycol-Propyl Glycol (SYSTANE OP) Apply 1 drop to eye daily as needed (for dry eyes).     [provider]  polyethylene glycol (MIRALAX) packet Take 17 g by mouth daily as needed for moderate constipation.     [provider]  senna (SENOKOT) 8.6 MG TABS tablet Take 1 tablet by mouth daily as needed for mild constipation.    [provider]  simvastatin (ZOCOR) 20 MG tablet Take 20 mg by mouth every evening.     Seward Carol, MD  traMADol (ULTRAM) 50 MG tablet Take 1 tablet (50 mg total) by mouth every 6 (six) hours as needed. Patient not taking: Reported on 02/26/2017 12/02/15   Festus Aloe, MD    Blood pressure 140/80, pulse 70, temperature 98.8 F (37.1 C), temperature source Oral, resp. rate 17, SpO2 (!) 88 %. Physical Exam: Physical Exam  Constitutional: He is oriented to person, place, and time and well-developed, well-nourished, and in no distress. No distress.  HENT:  Head: Normocephalic. Head is with laceration.    Right Ear: External ear normal.  Left Ear: External ear normal.   Nose: Nose normal.  Mouth/Throat: Oropharynx is clear and moist.  Eyes: Pupils are equal, round, and reactive to light. Conjunctivae and EOM are normal. No scleral icterus.  Neck: Normal range of motion. Neck supple. No spinous process tenderness and  no muscular tenderness present. No tracheal deviation present. No thyromegaly present.  Cardiovascular: Normal rate, regular rhythm, normal heart sounds and intact distal pulses.  Pulses:      Radial pulses are 2+ on the right side and 2+ on the left side.       Dorsalis pedis pulses are 2+ on the right side and 2+ on the left side.  Pulmonary/Chest: Effort normal and breath sounds normal. No stridor. No respiratory distress. He has no wheezes. He has no rales. He exhibits no tenderness.  Abdominal: Soft. Bowel sounds are normal. He exhibits no distension and no mass. There is no abdominal tenderness. There is no rebound and no guarding.  Musculoskeletal:     Comments: Abrasion noted to left knee. No TTP, no pain with knee ROM Ecchymosis noted to left shoulder. Mild TTP anterior shoulder and proximal humerus. Good active shoulder ROM  Neurological: He is alert and oriented to person, place, and time. No cranial nerve deficit. GCS score is 15.  Moving all 4 extremities. No gross motor or sensory deficitis  Skin: Skin is warm and dry. He is not diaphoretic.  Psychiatric: Mood, memory, affect and judgment normal.  Nursing note and vitals reviewed.    Results for orders placed or performed during the hospital encounter of 07/24/18 (from the past 48 hour(s))  D-dimer, quantitative (not at [facility])     Status: Abnormal   Collection Time: 07/24/18  2:59 PM  Result Value Ref Range   D-Dimer, Quant 0.70 (H) 0.00 - 0.50 ug/mL-FEU    Comment: (NOTE) At the manufacturer cut-off of 0.50 ug/mL FEU, this assay has been documented to exclude PE with a sensitivity and negative predictive value of 97 to 99%.  At this time, this assay has not been approved by  the FDA to exclude DVT/VTE. Results should be correlated with clinical presentation. Performed at [facility], [place] [address], [place], [place] [address]   CBC     Status: None   Collection Time: 07/24/18  2:59 PM  Result Value Ref Range   WBC 4.2 4.0 - 10.5 K/uL   RBC 4.82 4.22 - 5.81 MIL/uL   Hemoglobin 14.6 13.0 - 17.0 g/dL   HCT 44.3 39.0 - 52.0 %   MCV 91.9 80.0 - 100.0 fL   MCH 30.3 26.0 - 34.0 pg   MCHC 33.0 30.0 - 36.0 g/dL   RDW 14.6 11.5 - 15.5 %   Platelets 180 150 - 400 K/uL   nRBC 0.0 0.0 - 0.2 %    Comment: Performed at [facility], [place] [address], [place], [place] [address]   No results found.    Assessment/Plan Fall L IPH/ R SDH - neurosurgery consult pending L shoulder pain - check xray L periorbital lac - local wound care Covid + - per CCM HTN HLD GERD Osteoarthritis Anxiety/depression H/o bladder cancer  ID - none VTE - SCDs FEN - IVF, NPO Foley - none Follow up - TBD  Plan - Admit to the ICU. Neurosurgery consult pending. Keep NPO until seen by NS. Appreciate CCM assistance.  Wellington Hampshire, [facility] 07/24/2018, 3:37 PM Pager: [phone] Mon-Thurs 7:00 am-4:30 pm Fri 7:00 am -11:30 AM Sat-Sun 7:00 am-11:30 am

## 2018-07-24 NOTE — Consult Note (Signed)
Reason for Consult: Subdural hematoma Referring Physician: Dr. Georganna Skeans  Timothy Osborn is an 85 y.o. male.  HPI: The patient is an 85 year old right-handed individual who apparently sustained a fall from standing height today he was seen at [facility] in the emergency department and a CT scan apparently demonstrates presence of some parenchymal hemorrhage and a small subdural hematoma on the left.  The patient has been lucid since the time of the incident however because of the head injury and the fact that he had a fall he was transferred to [facility] at the time of his evaluation COVID test was found to be positive.  He is now in an isolation room.  In talking to the patient through the nurse in the room with him he appears alert awake oriented he denies any significant symptoms of a headache and at the time of the evaluation he is eating his dinner.  He moves his upper and lower extremities equally reporting by the nurse shows that his pupils are equal and on observing his face is symmetric grimace is noted.  He denies any problems moving his lower extremities.  The nurse reports that he is completely lucid.  Past Medical History:  Diagnosis Date  . Anxiety   . Arthritis   . BPH (benign prostatic hyperplasia)   . Cancer [facility]    bladder cancer  . Chronic back pain   . Depression   . GERD (gastroesophageal reflux disease)   . H/O hiatal hernia   . Headache    ocular migraine - rare occasions  (30 yrs ago)  . History of bladder cancer urologist-  dr Junious Silk   w/ recurrency  . History of kidney stones    last one removed 2018  . History of retinal detachment PARTIAL DETACH-- NO SURGICAL INTERVENTION--  RESOLVED   RIGHT EYE 2008/   LEFT 2012  . Hyperlipidemia   . Hypertension   . Hypogonadism male   . OCD (obsessive compulsive disorder)    "MILD"  . Osteoporosis    /OSTEOPENIA  . Renal calculus, left   . Renal neoplasm    left renal pelvic  . Spinal stenosis of lumbar  region   . Wears glasses     Past Surgical History:  Procedure Laterality Date  . BACK SURGERY    . CATARACT EXTRACTION W/ INTRAOCULAR LENS  IMPLANT, BILATERAL    . COLONOSCOPY    . CYSTOSCOPY W/ RETROGRADES Left 11/08/2015   Procedure: CYSTOSCOPY WITH RETROGRADE PYELOGRAM, URETEROSCOPY, STENT PLACEMENT;  Surgeon: Festus Aloe, MD;  Location: [facility];  Service: Urology;  Laterality: Left;  . CYSTOSCOPY WITH BIOPSY N/A 03/13/2013   Procedure: CYSTOSCOPY WITH  BLADDER BIOPSY MITOMYCIN - C;  Surgeon: Fredricka Bonine, MD;  Location: [facility];  Service: Urology;  Laterality: N/A;  . CYSTOSCOPY/URETEROSCOPY/HOLMIUM LASER/STENT PLACEMENT N/A 12/02/2015   Procedure: CYSTOSCOPY/URETEROSCOPY/HOLMIUM LASER/STENT PLACEMENT POSSIBLE BIOPSY;  Surgeon: Festus Aloe, MD;  Location: [facility];  Service: Urology;  Laterality: N/A;  . EYE SURGERY    . INGUINAL HERNIA REPAIR Left   . JOINT REPLACEMENT    . LUMBAR LAMINECTOMY/DECOMPRESSION MICRODISCECTOMY  08/29/2011   Procedure: LUMBAR LAMINECTOMY/DECOMPRESSION MICRODISCECTOMY 2 LEVELS;  Surgeon: Eustace Moore, MD;  Location: [place] [facility];  Service: Neurosurgery;  Laterality: N/A;  lumbar three-five lumbar laminectomy   . LUMBAR WOUND DEBRIDEMENT N/A 06/16/2012   Procedure: LUMBAR WOUND DEBRIDEMENT;  Surgeon: Eustace Moore, MD;  Location: [place] [facility];  Service:  Neurosurgery;  Laterality: N/A;  Lumbar Wound Irrigation and debridment  . REVERSE SHOULDER ARTHROPLASTY Right 03/15/2017   Procedure: RIGHT REVERSE SHOULDER ARTHROPLASTY;  Surgeon: Netta Cedars, MD;  Location: [place];  Service: Orthopedics;  Laterality: Right;  . SHOULDER ARTHROSCOPY WITH ROTATOR CUFF REPAIR Right 07/2013  . TONSILLECTOMY  AS CHILD  . TOTAL KNEE ARTHROPLASTY  12/18/2010   Procedure: TOTAL KNEE ARTHROPLASTY;  Surgeon: Lorn Junes, MD;  Location: [place];  Service: Orthopedics;  Laterality: Left;  ARTHROPLASTY KNEE  TOTAL LEFT SIDE  . TRANSURETHRAL RESECTION OF BLADDER TUMOR N/A 05/06/2012   Procedure: TRANSURETHRAL RESECTION OF BLADDER TUMOR (TURBT) WITH INSTILATION OF MYTOMIACIN C;  Surgeon: Fredricka Bonine, MD;  Location: [facility];  Service: Urology;  Laterality: N/A;    Family History  Problem Relation Age of Onset  . Heart failure Father   . Heart attack Brother     Social History:  reports that he quit smoking about 31 years ago. His smoking use included pipe. He has a 8.00 pack-year smoking history. He has never used smokeless tobacco. He reports current alcohol use of about 7.0 standard drinks of alcohol per week. He reports that he does not use drugs.  Allergies:  Allergies  Allergen Reactions  . Prozac [Fluoxetine Hcl] Anxiety and Other (See Comments)    "Makes me a little jittery"  . Vicodin [Hydrocodone-Acetaminophen] Other (See Comments)    Hallucinations     Medications: I have reviewed the patient's current medications.  Results for orders placed or performed during the hospital encounter of 07/24/18 (from the past 48 hour(s))  MRSA PCR Screening     Status: None   Collection Time: 07/24/18  2:35 PM  Result Value Ref Range   MRSA by PCR NEGATIVE NEGATIVE    Comment:        The GeneXpert MRSA Assay (FDA approved for NASAL specimens only), is one component of a comprehensive MRSA colonization surveillance program. It is not intended to diagnose MRSA infection nor to guide or monitor treatment for MRSA infections. Performed at [facility], [place] [address] [place], [place] [address]   Lactate dehydrogenase     Status: None   Collection Time: 07/24/18  2:59 PM  Result Value Ref Range   LDH 183 98 - 192 U/L    Comment: Performed at [facility], [place] [address], [place], [place] [address]  Ferritin     Status: None   Collection Time: 07/24/18  2:59 PM  Result Value Ref Range   Ferritin 60 24 - 336 ng/mL    Comment: Performed at [facility] [address] [place], [place] [address]  D-dimer, quantitative (not at [facility])     Status: Abnormal   Collection Time: 07/24/18  2:59 PM  Result Value Ref Range   D-Dimer, Quant 0.70 (H) 0.00 - 0.50 ug/mL-FEU    Comment: (NOTE) At the manufacturer cut-off of 0.50 ug/mL FEU, this assay has been documented to exclude PE with a sensitivity and negative predictive value of 97 to 99%.  At this time, this assay has not been approved by the FDA to exclude DVT/VTE. Results should be correlated with clinical presentation. Performed at [facility], [place] [address] [place], [place] [address]   C-reactive protein     Status: Abnormal   Collection Time: 07/24/18  2:59 PM  Result Value Ref Range   CRP 4.7 (H) <1.0 mg/dL    Comment: Performed  at [facility], [place] [address] [place], [place] [address]  Brain natriuretic peptide     Status: None   Collection Time: 07/24/18  2:59 PM  Result Value Ref Range   B Natriuretic Peptide 38.0 0.0 - 100.0 pg/mL    Comment: Performed at [facility] [address], [place], [place] [address]  Comprehensive metabolic panel     Status: Abnormal   Collection Time: 07/24/18  2:59 PM  Result Value Ref Range   Sodium 137 135 - 145 mmol/L   Potassium 3.6 3.5 - 5.1 mmol/L   Chloride 101 98 - 111 mmol/L   CO2 26 22 - 32 mmol/L   Glucose, Bld 97 70 - 99 mg/dL   BUN 15 8 - 23 mg/dL   Creatinine, Ser 0.91 0.61 - 1.24 mg/dL   Calcium 8.2 (L) 8.9 - 10.3 mg/dL   Total Protein 6.2 (L) 6.5 - 8.1 g/dL   Albumin 3.1 (L) 3.5 - 5.0 g/dL   AST 33 15 - 41 U/L   ALT 21 0 - 44 U/L   Alkaline Phosphatase 73 38 - 126 U/L   Total Bilirubin 0.9 0.3 - 1.2 mg/dL   GFR calc non Af Amer >60 >60 mL/min   GFR calc Af Amer >60 >60 mL/min   Anion gap 10 5 - 15    Comment: Performed at [facility] [address], [place], [place] [address]  CBC     Status: None   Collection Time: 07/24/18  2:59 PM  Result Value Ref Range   WBC 4.2 4.0 -  10.5 K/uL   RBC 4.82 4.22 - 5.81 MIL/uL   Hemoglobin 14.6 13.0 - 17.0 g/dL   HCT 44.3 39.0 - 52.0 %   MCV 91.9 80.0 - 100.0 fL   MCH 30.3 26.0 - 34.0 pg   MCHC 33.0 30.0 - 36.0 g/dL   RDW 14.6 11.5 - 15.5 %   Platelets 180 150 - 400 K/uL   nRBC 0.0 0.0 - 0.2 %    Comment: Performed at [facility], [place] [address], [place], [place] [address]  Procalcitonin - Baseline     Status: None   Collection Time: 07/24/18  2:59 PM  Result Value Ref Range   Procalcitonin <0.10 ng/mL    Comment:        Interpretation: PCT (Procalcitonin) <= 0.5 ng/mL: Systemic infection (sepsis) is not likely. Local bacterial infection is possible. (NOTE)       Sepsis PCT Algorithm           Lower Respiratory Tract                                      Infection PCT Algorithm    ----------------------------     ----------------------------         PCT < 0.25 ng/mL                PCT < 0.10 ng/mL         Strongly encourage             Strongly discourage   discontinuation of antibiotics    initiation of antibiotics    ----------------------------     -----------------------------       PCT 0.25 - 0.50 ng/mL            PCT 0.10 - 0.25 ng/mL  OR       >80% decrease in PCT            Discourage initiation of                                            antibiotics      Encourage discontinuation           of antibiotics    ----------------------------     -----------------------------         PCT >= 0.50 ng/mL              PCT 0.26 - 0.50 ng/mL               AND        <80% decrease in PCT             Encourage initiation of                                             antibiotics       Encourage continuation           of antibiotics    ----------------------------     -----------------------------        PCT >= 0.50 ng/mL                  PCT > 0.50 ng/mL               AND         increase in PCT                  Strongly encourage                                      initiation of  antibiotics    Strongly encourage escalation           of antibiotics                                     -----------------------------                                           PCT <= 0.25 ng/mL                                                 OR                                        > 80% decrease in PCT                                     Discontinue / Do not initiate  antibiotics Performed at [facility], [place] [address] [place], [place] [address]   Lactic acid, plasma     Status: None   Collection Time: 07/24/18  2:59 PM  Result Value Ref Range   Lactic Acid, Venous 0.9 0.5 - 1.9 mmol/L    Comment: Performed at [facility] [address] [place], [place] [address]  Sedimentation rate     Status: None   Collection Time: 07/24/18  2:59 PM  Result Value Ref Range   Sed Rate 12 0 - 16 mm/hr    Comment: Performed at [facility] [address] [place], [place] [address]  Magnesium     Status: None   Collection Time: 07/24/18  2:59 PM  Result Value Ref Range   Magnesium 2.1 1.7 - 2.4 mg/dL    Comment: Performed at [facility], [place] [address], [place], [place] [address]  Phosphorus     Status: None   Collection Time: 07/24/18  2:59 PM  Result Value Ref Range   Phosphorus 2.7 2.5 - 4.6 mg/dL    Comment: Performed at [facility] [address], [place],  [address]    Dg Chest Port 1 View  Result Date: 07/24/2018 CLINICAL DATA:  Fall.  Covey 19 positive. EXAM: PORTABLE CHEST 1 VIEW COMPARISON:  Chest x-ray dated May 21, 2012. FINDINGS: The heart size and mediastinal contours are within normal limits. Atherosclerotic calcification of the aortic arch. Normal pulmonary vascularity. No focal consolidation, pleural effusion, or pneumothorax. No acute osseous abnormality. IMPRESSION: No active disease. Electronically Signed   By: Titus Dubin M.D.   On: 07/24/2018 19:35   Dg Shoulder Left  Port  Result Date: 07/24/2018 CLINICAL DATA:  Pain after fall. EXAM: LEFT SHOULDER - 1 VIEW COMPARISON:  None. FINDINGS: No acute fracture or dislocation. Mild degenerative changes of the acromioclavicular joint. Soft tissues are unremarkable. IMPRESSION: 1.  No acute osseous abnormality. Electronically Signed   By: Titus Dubin M.D.   On: 07/24/2018 19:33    Review of Systems  Constitutional: Negative.   HENT: Negative.   Eyes: Negative.   Respiratory: Negative.   Cardiovascular: Negative.   Genitourinary: Negative.   Musculoskeletal: Negative.   Skin: Negative.   Neurological: Negative.   Endo/Heme/Allergies: Negative.   Psychiatric/Behavioral: Negative.    Blood pressure (!) 158/83, pulse 72, temperature 98.8 F (37.1 C), temperature source Oral, resp. rate 17, height 6\' 2"  (1.88 m), weight 83.7 kg, SpO2 93 %. Physical Exam  Constitutional: He is oriented to person, place, and time. He appears well-developed and well-nourished.  HENT:  Head: Normocephalic and atraumatic.  Eyes: Pupils are equal, round, and reactive to light. Conjunctivae are normal.  Neurological: He is alert and oriented to person, place, and time.  No evidence of cortical drift moves all 4 extremities normally.    Assessment/Plan: Subdural hematoma with perhaps small parenchymal contusion per the report no films are available for review.  Plan: Observation for now CT of the head will be repeated in the morning.  I do not suspect further significant injury will be noted.  Blanchie Dessert Harshal Sirmon 07/24/2018, 8:46 PM

## 2018-07-25 ENCOUNTER — Inpatient Hospital Stay (HOSPITAL_COMMUNITY): Payer: PPO

## 2018-07-25 LAB — BASIC METABOLIC PANEL
Anion gap: 9 (ref 5–15)
BUN: 14 mg/dL (ref 8–23)
CO2: 26 mmol/L (ref 22–32)
Calcium: 7.8 mg/dL — ABNORMAL LOW (ref 8.9–10.3)
Chloride: 103 mmol/L (ref 98–111)
Creatinine, Ser: 0.94 mg/dL (ref 0.61–1.24)
GFR calc Af Amer: >60 mL/min (ref >60)
GFR calc non Af Amer: >60 mL/min (ref >60)
Glucose, Bld: 101 mg/dL — ABNORMAL HIGH (ref 70–99)
Potassium: 4.1 mmol/L (ref 3.5–5.1)
Sodium: 138 mmol/L (ref 135–145)

## 2018-07-25 LAB — CBC
HCT: 40.5 % (ref 39.0–52.0)
Hemoglobin: 13.3 g/dL (ref 13.0–17.0)
MCH: 30.3 pg (ref 26.0–34.0)
MCHC: 32.8 g/dL (ref 30.0–36.0)
MCV: 92.3 fL (ref 80.0–100.0)
Platelets: 167 10*3/uL (ref 150–400)
RBC: 4.39 MIL/uL (ref 4.22–5.81)
RDW: 14.8 % (ref 11.5–15.5)
WBC: 4.2 10*3/uL (ref 4.0–10.5)
nRBC: 0 % (ref 0.0–0.2)

## 2018-07-25 LAB — INTERLEUKIN-6, PLASMA: Interleukin-6, Plasma: 47.7 pg/mL — ABNORMAL HIGH (ref 0.0–12.2)

## 2018-07-25 MED ORDER — SIMETHICONE 80 MG PO CHEW
80.0000 mg | CHEWABLE_TABLET | Freq: Four times a day (QID) | ORAL | Status: DC | PRN
  Administered 2018-07-25 – 2018-07-26 (×2): 80 mg via ORAL
  Filled 2018-07-25 (×3): qty 1

## 2018-07-25 NOTE — Evaluation (Signed)
Physical Therapy Evaluation Patient Details Name: Timothy Osborn MRN: [id number] DOB: 1928-10-27 Today's Date: 07/25/2018   History of Present Illness  Timothy Osborn is an 85yo male PMH HTN, HLD, GERD, osteoarthritis, anxiety/depression, h/o bladder cancer, rigth reverse shoulder who was transferred from [facility] ED to [facility] for admission to the trauma service after suffering a ground level fall at home. CT head which revealed small regions of intraparenchymal hemorrhage of the subcortical left frontal lobe with hemorrhage measuring up to 7 mm; focal 3 mm area of increased density in the right high frontal region likely represents a small focal subdural hemorrhage. COVID +  Clinical Impression  Pt admitted with above diagnosis and presents to PT with functional limitations due to deficits listed below (See PT problem list). Pt needs skilled PT to maximize independence and safety to allow discharge to home with family. Will follow acutely but doubt he will need PT after DC.      Follow Up Recommendations No PT follow up    Equipment Recommendations  None recommended by PT    Recommendations for Other Services       Precautions / Restrictions Precautions Precautions: Fall Restrictions Weight Bearing Restrictions: No      Mobility  Bed Mobility Overal bed mobility: Independent                Transfers Overall transfer level: Needs assistance Equipment used: Straight cane Transfers: Sit to/from Stand Sit to Stand: Supervision         General transfer comment: supervision with lines/tubes  Ambulation/Gait Ambulation/Gait assistance: Supervision Gait Distance (Feet): 90 Feet Assistive device: Straight cane Gait Pattern/deviations: Step-through pattern;Decreased stride length Gait velocity: decr Gait velocity interpretation: 1.31 - 2.62 ft/sec, indicative of limited community ambulator General Gait Details: slightly unsteady gait but no overt LOB. Amb on RA with  SpO2 90%  Stairs            Wheelchair Mobility    Modified Rankin (Stroke Patients Only)       Balance Overall balance assessment: Needs assistance Sitting-balance support: No upper extremity supported;Feet supported Sitting balance-Leahy Scale: Good Sitting balance - Comments: donned socks at EOB without LOB   Standing balance support: No upper extremity supported Standing balance-Leahy Scale: Fair Standing balance comment: Static standing without UE support but UE support with dynamic                              Pertinent Vitals/Pain Pain Assessment: No/denies pain    Home Living Family/patient expects to be discharged to:: Private residence Living Arrangements: Spouse/significant other;Children(daughter) Available Help at Discharge: Family;Available 24 hours/day Type of Home: House Home Access: Level entry     Home Layout: One level Home Equipment: Cane - single point Additional Comments: Living with dtr for now--just moved out of where they were and going to move into a new house shortly    Prior Function Level of Independence: Independent         Comments: Started using cane a few days prior to admit     Hand Dominance   Dominant Hand: Right    Extremity/Trunk Assessment   Upper Extremity Assessment Upper Extremity Assessment: Defer to OT evaluation RUE Deficits / Details: limited at shoulder due to PHMx of reverse shoulder RUE Coordination: decreased gross motor    Lower Extremity Assessment Lower Extremity Assessment: Generalized weakness       Communication   Communication: No difficulties  Cognition Arousal/Alertness: Awake/alert Behavior During Therapy: WFL for tasks assessed/performed Overall Cognitive Status: Within Functional Limits for tasks assessed                                        General Comments      Exercises     Assessment/Plan    PT Assessment Patient needs continued PT  services  PT Problem List Decreased strength;Decreased balance;Decreased mobility       PT Treatment Interventions DME instruction;Gait training;Functional mobility training;Therapeutic activities;Therapeutic exercise;Balance training;Patient/family education    PT Goals (Current goals can be found in the Care Plan section)  Acute Rehab PT Goals Patient Stated Goal: hopeful to go home soon PT Goal Formulation: With patient Time For Goal Achievement: 08/08/18 Potential to Achieve Goals: Good    Frequency Min 3X/week   Barriers to discharge        Co-evaluation PT/OT/SLP Co-Evaluation/Treatment: Yes Reason for Co-Treatment: For patient/therapist safety PT goals addressed during session: Mobility/safety with mobility;Balance;Proper use of DME OT goals addressed during session: ADL's and self-care       AM-PAC PT "6 Clicks" Mobility  Outcome Measure Help needed turning from your back to your side while in a flat bed without using bedrails?: None Help needed moving from lying on your back to sitting on the side of a flat bed without using bedrails?: None Help needed moving to and from a bed to a chair (including a wheelchair)?: A Little Help needed standing up from a chair using your arms (e.g., wheelchair or bedside chair)?: A Little Help needed to walk in hospital room?: A Little Help needed climbing 3-5 steps with a railing? : A Little 6 Click Score: 20    End of Session   Activity Tolerance: Patient tolerated treatment well Patient left: in bed;with call bell/phone within reach Nurse Communication: Mobility status PT Visit Diagnosis: Unsteadiness on feet (R26.81)    Time: 4142-3953 PT Time Calculation (min) (ACUTE ONLY): 29 min   Charges:   PT Evaluation $PT Eval Moderate Complexity: 1 Mod          [place] Pager [phone] Office [place] 07/25/2018, 1:19 PM

## 2018-07-25 NOTE — Progress Notes (Signed)
SLP Cancellation Note  Patient Details Name: RUFFUS KAMAKA MRN: [id number] DOB: Sep 30, 1928   Cancelled treatment:       Reason Eval/Treat Not Completed: SLP screened, no needs identified, will sign off   Kimi Kroft, Katherene Ponto 07/25/2018, 12:17 PM

## 2018-07-25 NOTE — Plan of Care (Signed)
  Problem: Clinical Measurements: Goal: Diagnostic test results will improve Outcome: Progressing   Problem: Activity: Goal: Risk for activity intolerance will decrease Outcome: Progressing   Problem: Nutrition: Goal: Adequate nutrition will be maintained Outcome: Progressing   Problem: Coping: Goal: Level of anxiety will decrease Outcome: Progressing   Problem: Elimination: Goal: Will not experience complications related to bowel motility Outcome: Progressing   Problem: Pain Managment: Goal: General experience of comfort will improve Outcome: Progressing   Problem: Safety: Goal: Ability to remain free from injury will improve Outcome: Progressing   Problem: Skin Integrity: Goal: Risk for impaired skin integrity will decrease Outcome: Progressing   Problem: Clinical Measurements: Goal: Ability to maintain clinical measurements within normal limits will improve Outcome: Adequate for Discharge Goal: Respiratory complications will improve Outcome: Adequate for Discharge Goal: Cardiovascular complication will be avoided Outcome: Adequate for Discharge   Problem: Elimination: Goal: Will not experience complications related to urinary retention Outcome: Adequate for Discharge

## 2018-07-25 NOTE — Progress Notes (Signed)
   NAME:  Timothy Osborn, MRN:  [id number], DOB:  [birth year], LOS: 1 ADMISSION DATE:  07/24/2018, CONSULTATION DATE:  07/24/2018 REFERRING MD:  Trauma services, CHIEF COMPLAINT:  Intraparenchymal hemorrhage, COVID 93 confirmed    Brief History   85 yo male with traumatic IPH incidentally COVID 19 positive admitted to MICU.   History of present illness   Timothy Osborn is an 85 yo male who presented to [facility] ED after he sustained a fall. He reported to staff that he got up to use the bathroom when his spouse found him on the floor with loss of consciousness. He had a laceration above left eye. W/u in ED revealed L frontal lobe IPH measuring up to 1mm and R frontal region with small SDH. Incidentally the pt c/o fever and not feeling well since Sunday 6/21 and had planned to see his PCP d/t same. COVID 19 test positive in ED. Labs notable for lymphopenia. He was accepted in transfer to [facility] by trauma services. PCCM consulted for COVID 19.  Past Medical History  Anxiety/Depression Chronic pain GERD H/O hiatal hernia H/O bladder Ca  HLD OCD, mild   Significant Hospital Events   Transferred to [facility] from [facility] ED    Consults:  PCCM   Procedures:  NA  Significant Diagnostic Tests:  6/25: OSH imaging report: "CT head 1.Small regions of intraparenchymal hemorrhage of the subcortical left frontal lobe with hemorrhage measuring up to 7 mm.   2. Focal 3 mm area of increased density in the right high frontal region likely represents a small focal subdural hemorrhage.  3. Skin/subcutaneous hemorrhage, edema or laceration of the left orbital/anterior temporal region."  Micro Data:  COVID 19 positive  Antimicrobials:  Not indicated.  Interim history/subjective:   No apparent dyspnea or chest pain Afebrile  Objective   Blood pressure 135/87, pulse 75, temperature 98 F (36.7 C), temperature source Oral, resp. rate 17, height 6\' 2"  (1.88 m), weight 83.7 kg, SpO2 94 %.         Intake/Output Summary (Last 24 hours) at 07/25/2018 1048 Last data filed at 07/25/2018 1000 Gross per 24 hour  Intake 1525.11 ml  Output 575 ml  Net 950.11 ml   Filed Weights   07/24/18 1845  Weight: 83.7 kg     Examination: No respiratory distress No accessory muscle use Grossly nonfocal Awake and alert per RN  Resolved Hospital Problem list   NA  Assessment & Plan:   Acute traumatic ICH P: -Rpt head CT is planned, if no change, he can transfer to floor  COVID 19 confirmed  P: -Biomarkers are low and he is requiring minimal oxygen, does not need remdesevir or other meds -His risk factor is unclear, apparently his wife also tested positive.  PCCM will be available as needed  Kara Mead MD. FCCP. [place] [facility] Pager [phone] If no response call [phone]   07/25/2018

## 2018-07-25 NOTE — Progress Notes (Signed)
Patient ID: Timothy Osborn, male   DOB: [birth year], 85 y.o.   MRN: [id number] Patient is still awaiting CT scan of head to be done today however clinically he appears to be doing well.  He is not likely to require any surgical intervention and simple observation is reasonable.  We will remain available to reevaluate him should there be any significant change in his functional status.  Please contact us as necessary.

## 2018-07-25 NOTE — Progress Notes (Signed)
Trauma Service Note  Chief Complaint/Subjective: No complaints, required oxygenation for desaturation lastnight  Objective: Vital signs in last 24 hours: Temp:  [98 F (36.7 C)-99.4 F (37.4 C)] 98 F (36.7 C) (06/26 0359) Pulse Rate:  [65-74] 66 (06/26 0800) Resp:  [11-35] 12 (06/26 0800) BP: (134-161)/(67-88) 138/82 (06/26 0800) SpO2:  [88 %-96 %] 91 % (06/26 0800) Weight:  [83.7 kg] 83.7 kg (06/25 1845) Last BM Date: 07/24/18  Intake/Output from previous day: 06/25 0701 - 06/26 0700 In: 1125.1 [I.V.:1125.1] Out: 325 [Urine:325] Intake/Output this shift: Total I/O In: 200 [I.V.:200] Out: 250 [Urine:250]  General: NAD  Lungs: nonlabored breathing  Abd: nontender, nondistended, soft  Extremities: no edema  Neuro: AOx4  Lab Results: CBC  Recent Labs    07/24/18 1459 07/25/18 0546  WBC 4.2 4.2  HGB 14.6 13.3  HCT 44.3 40.5  PLT 180 167   BMET Recent Labs    07/24/18 1459 07/25/18 0546  NA 137 138  K 3.6 4.1  CL 101 103  CO2 26 26  GLUCOSE 97 101*  BUN 15 14  CREATININE 0.91 0.94  CALCIUM 8.2* 7.8*   PT/INR No results for input(s): LABPROT, INR in the last 72 hours. ABG No results for input(s): PHART, HCO3 in the last 72 hours.  Invalid input(s): PCO2, PO2  Studies/Results: Dg Chest Port 1 View  Result Date: 07/24/2018 CLINICAL DATA:  Fall.  [place] 19 positive. EXAM: PORTABLE CHEST 1 VIEW COMPARISON:  Chest x-ray dated May 21, 2012. FINDINGS: The heart size and mediastinal contours are within normal limits. Atherosclerotic calcification of the aortic arch. Normal pulmonary vascularity. No focal consolidation, pleural effusion, or pneumothorax. No acute osseous abnormality. IMPRESSION: No active disease. Electronically Signed   By: Titus Dubin M.D.   On: 07/24/2018 19:35   Dg Shoulder Left Port  Result Date: 07/24/2018 CLINICAL DATA:  Pain after fall. EXAM: LEFT SHOULDER - 1 VIEW COMPARISON:  None. FINDINGS: No acute fracture or  dislocation. Mild degenerative changes of the acromioclavicular joint. Soft tissues are unremarkable. IMPRESSION: 1.  No acute osseous abnormality. Electronically Signed   By: Titus Dubin M.D.   On: 07/24/2018 19:33    Anti-infectives: Anti-infectives (From admission, onward)   None      Medications Scheduled Meds: . chlorhexidine  15 mL Mouth Rinse BID  . Chlorhexidine Gluconate Cloth  6 each Topical Q0600  . docusate sodium  100 mg Oral BID  . irbesartan  150 mg Oral Daily  . mouth rinse  15 mL Mouth Rinse BID  . pantoprazole  40 mg Oral Daily   Or  . pantoprazole (PROTONIX) IV  40 mg Intravenous Daily  . PARoxetine  30 mg Oral Daily  . simvastatin  20 mg Oral QPM   Continuous Infusions: . 0.9 % NaCl with KCl 20 mEq / L 100 mL/hr at 07/25/18 0538   PRN Meds:.acetaminophen, hydrALAZINE, morphine injection, ondansetron **OR** ondansetron (ZOFRAN) IV, polyethylene glycol, polyvinyl alcohol, traMADol  Assessment/Plan: 85 yo male fall from standing height SDH - neuro intact, repeat CT today, Dr. Ellene Route following COVID+ - on 1.5l O2 currently, PCCM following  FEN- clears until after CT, then can advance VTE- scds, no chemical proph due to bleed ID- no issues Dispo- f/u CT scan today, f/u consultants, likely transfer to floor later today   LOS: 1 day   [place] Surgeon [phone] Surgery 07/25/2018

## 2018-07-25 NOTE — Evaluation (Signed)
Occupational Therapy Evaluation and Discharge Patient Details Name: Timothy Osborn MRN: [id number] DOB: [birth year] Today's Date: 07/25/2018    History of Present Illness Timothy Osborn is an 85yo male PMH HTN, HLD, GERD, osteoarthritis, anxiety/depression, h/o bladder cancer, rigth reverse shoulder who was transferred from [facility] ED to [facility] for admission to the trauma service after suffering a ground level fall at home. CT head which revealed small regions of intraparenchymal hemorrhage of the subcortical left frontal lobe with hemorrhage measuring up to 7 mm; focal 3 mm area of increased density in the right high frontal region likely represents a small focal subdural hemorrhage. COVID +   Clinical Impression   This 85 yo male admitted with above presents to acute OT at an overall S level with use of SPC. He has his wife and dtr that will be at home with him so he will have the S needed. No further OT needs, we will sign off.    Follow Up Recommendations  No OT follow up;Supervision - Intermittent    Equipment Recommendations  None recommended by OT       Precautions / Restrictions Precautions Precautions: Fall Restrictions Weight Bearing Restrictions: No      Mobility Bed Mobility Overal bed mobility: Independent                Transfers Overall transfer level: Needs assistance Equipment used: Straight cane Transfers: Sit to/from Stand Sit to Stand: Supervision         General transfer comment: with SPC    Balance Overall balance assessment: Needs assistance Sitting-balance support: No upper extremity supported;Feet supported Sitting balance-Leahy Scale: Good Sitting balance - Comments: donned socks at EOB without LOB   Standing balance support: Single extremity supported Standing balance-Leahy Scale: Poor Standing balance comment: Does better with SPC (has only recently started using it)                           ADL either performed or  assessed with clinical judgement   ADL                                         General ADL Comments: Overall at S level with use of SPC     Vision Baseline Vision/History: Wears glasses Patient Visual Report: No change from baseline              Pertinent Vitals/Pain Pain Assessment: No/denies pain     Hand Dominance Right   Extremity/Trunk Assessment Upper Extremity Assessment Upper Extremity Assessment: RUE deficits/detail RUE Deficits / Details: limited at shoulder due to PHMx of reverse shoulder RUE Coordination: decreased gross motor           Communication Communication Communication: No difficulties   Cognition Arousal/Alertness: Awake/alert Behavior During Therapy: WFL for tasks assessed/performed Overall Cognitive Status: Within Functional Limits for tasks assessed                                                Home Living Family/patient expects to be discharged to:: Private residence Living Arrangements: Spouse/significant other;Children(daughter) Available Help at Discharge: Family;Available 24 hours/day Type of Home: House Home Access: Level entry     Home Layout: One level  Bathroom Shower/Tub: Occupational psychologist: Standard     Home Equipment: Cane - single point   Additional Comments: Living with dtr for now--just moved out of where they were and going to move into a new house shortly      Prior Functioning/Environment Level of Independence: Independent                 OT Problem List: Impaired balance (sitting and/or standing)         OT Goals(Current goals can be found in the care plan section) Acute Rehab OT Goals Patient Stated Goal: hopeful to go home soon  OT Frequency:             Co-evaluation PT/OT/SLP Co-Evaluation/Treatment: Yes Reason for Co-Treatment: For patient/therapist safety;To address functional/ADL transfers PT goals addressed during session:  Mobility/safety with mobility;Balance;Proper use of DME OT goals addressed during session: ADL's and self-care      AM-PAC OT "6 Clicks" Daily Activity     Outcome Measure Help from another person eating meals?: None Help from another person taking care of personal grooming?: None Help from another person toileting, which includes using toliet, bedpan, or urinal?: None Help from another person bathing (including washing, rinsing, drying)?: None Help from another person to put on and taking off regular upper body clothing?: None Help from another person to put on and taking off regular lower body clothing?: None 6 Click Score: 24   End of Session Equipment Utilized During Treatment: [facility]) Nurse Communication: Mobility status  Activity Tolerance: Patient tolerated treatment well Patient left: in bed;with call bell/phone within reach  OT Visit Diagnosis: Unsteadiness on feet (R26.81)                Time: 9892-1194 OT Time Calculation (min): 29 min Charges:  OT General Charges $OT Visit: 1 Visit OT Evaluation $OT Eval Moderate Complexity: 1 Mod  [place], OTR/L Acute [employer] Pager [phone] Office [phone]     Almon Register 07/25/2018, 11:43 AM

## 2018-07-26 MED ORDER — PREDNISONE 20 MG PO TABS: 40.0000 mg | ORAL_TABLET | Freq: Every day | ORAL | 0 refills | Status: AC

## 2018-07-26 NOTE — Progress Notes (Signed)
Patient [place] home via car with wife.  DC instructions and information of where to pick up prescription given and fully understood.  Vital signs and assessments were both stable prior to discharge.

## 2018-07-26 NOTE — Care Management (Signed)
Discussed DME pulse ox with bedside RN this am prior to patient d/c.  RN informs me patient will not wait for pulse ox to be obtained.   Pulse oximeters are obtained from the [facility] at [facility].  AC will leave one at front desk for patient to pick up today.  When tried to call patient to advise, neither number is working.  RN on unit advised and also attempted contacting patient.  Will relay message to patient if contacted.

## 2018-07-26 NOTE — Discharge Summary (Signed)
[facility] Discharge Summary   Patient ID: Timothy Osborn MRN: [id number] DOB/AGE: 83-Jul-1930 83 y.o.  Admit date: 07/24/2018 Discharge date: 07/26/2018  Admitting Diagnosis: Fall L IPH/ R SDH L shoulder pain L periorbital lac Covid + - per CCM HTN HLD GERD Osteoarthritis Anxiety/depression H/o bladder cancer  Discharge Diagnosis Patient Active Problem List   Diagnosis Date Noted  . Fall 07/24/2018  . COVID-19 virus detected   . S/P shoulder replacement, right 03/15/2017  . DJD (degenerative joint disease) of knee 12/05/2010  . Detached retina, bilateral 12/05/2010  . Anxiety 12/05/2010  . Hypercholesterolemia   . Osteoporosis, idiopathic     Consultants Neurosurgery CCM  Imaging: Ct Head Wo Contrast  Result Date: 07/25/2018 CLINICAL DATA:  85 year old male with a reported history of left intraparenchymal hemorrhage and right subdural hemorrhage. COVID-19 Osborn. EXAM: CT HEAD WITHOUT CONTRAST TECHNIQUE: Contiguous axial images were obtained from the base of the skull through the vertex without intravenous contrast. COMPARISON:  Remote prior CT scan of the head 06/17/2012 FINDINGS: Brain: Compared to prior imaging, there has been progression of cerebral cortical volume loss resulting in expansion of the subarachnoid space. On the right, overlying the posterior frontal convexity there is a small 3 mm focus of high attenuation which could represent the reported subdural hemorrhage. There is a 6 x 4 x 5 mm focus of high attenuation within the subcortical white matter of the left frontal lobe with mild surrounding vasogenic edema consistent with a small intraparenchymal hemorrhage. No evidence of hydrocephalus. Vascular: No hyperdense vessel or unexpected calcification. Skull: Normal. Negative for fracture or focal lesion. Sinuses/Orbits: No acute finding. Other: None. IMPRESSION: 1. Small 6 x 5 x 4 mm focus of high attenuation in the subcortical white matter  of the anterior left frontal lobe consistent with a small intraparenchymal hemorrhage. There is slight surrounding vasogenic edema. An underlying small metastatic lesion is difficult to exclude radiographically. Recommend follow-up imaging with pre and post contrast enhanced MRI of the brain in 2-4 weeks. 2. Small 3 x 5 mm focus of high attenuation in the subarachnoid/subdural space overlying the right posterior frontal convexity concerning for a small focus of extra-axial hemorrhage. 3. Moderate cerebral and cortical volume loss. Electronically Signed   By: Jacqulynn Cadet M.D.   On: 07/25/2018 16:34   Dg Chest Port 1 View  Result Date: 07/24/2018 CLINICAL DATA:  Fall.  Timothy Osborn. EXAM: PORTABLE CHEST 1 VIEW COMPARISON:  Chest x-ray dated May 21, 2012. FINDINGS: The heart size and mediastinal contours are within normal limits. Atherosclerotic calcification of the aortic arch. Normal pulmonary vascularity. No focal consolidation, pleural effusion, or pneumothorax. No acute osseous abnormality. IMPRESSION: No active disease. Electronically Signed   By: Titus Dubin M.D.   On: 07/24/2018 19:35   Dg Shoulder Left Port  Result Date: 07/24/2018 CLINICAL DATA:  Pain after fall. EXAM: LEFT SHOULDER - 1 VIEW COMPARISON:  None. FINDINGS: No acute fracture or dislocation. Mild degenerative changes of the acromioclavicular joint. Soft tissues are unremarkable. IMPRESSION: 1.  No acute osseous abnormality. Electronically Signed   By: Titus Dubin M.D.   On: 07/24/2018 19:33    Procedures None  Hospital Course:  Timothy Osborn is an 85yo male PMH HTN, HLD, GERD, osteoarthritis, anxiety/depression, h/o bladder cancer, who was transferred from [facility] ED to [facility] for admission to the trauma service after suffering a ground level fall at home. States that he was in the bathroom standing up to urinate when he fell  and hit his head. +LOC but unsure for how long. When he awoke his wife was helping  to pull him out of the bathroom. Complains of mild headache, laceration near left eye. Denies blurry vision, diplopia, nausea, vomiting, neck pain, back pain, or pain in any extremity. He does have an abrasion on the left knee and bruise on the left shoulder.  ED workup included CT head which revealed small regions of intraparenchymal hemorrhage of the subcortical left frontal lobe with hemorrhage measuring up to 7 mm; gocal 3 mm area of increased density in the right high frontal region likely represents a small focal subdural hemorrhage. CXR shows no pneumothorax, mild chronic appearing interstitial and central bronchitic changes in both lungs without focal infiltrate or consolidation. Covid test Osborn. Patient was admitted to the trauma service. Neurosurgery consulted for head injury and recommended conservative management with follow up head CT scan. This remained stable. CCM was consulted for assistance with management of covid 19. Patient worked with therapies during this admission who recommended home when medically stable for discharge. On 6/27 the patient was voiding well, tolerating diet, ambulating well, pain well controlled, vital signs stable and felt stable for discharge home.  Discussed with [facility] hospitalists and recommend sending him home with a pulse oximeter, 7 days of prednisone 40mg  once daily, and 1 week follow up with PCP for covid 19. Patient will follow up as below and knows to call with questions or concerns.   Physical Exam: General:  Alert, NAD, pleasant, comfortable HEENT: left periorbital laceration healing well with no erythema or drainage. EOMs intact. PERRL Pulm: effort normal, CTAB, no wheezing or rhonchi Cardio: RRR Abd:  Soft, ND, NT, +BS Psych: A&Ox3 Neuro: moving all 4 extremities, no gross motor or sensory deficits Skin: warm and dry, no rashes  Allergies as of 07/26/2018      Reactions   Prozac [fluoxetine Hcl] Anxiety, Other (See Comments)   "Makes me a  little jittery"   Vicodin [hydrocodone-acetaminophen] Other (See Comments)   Hallucinations      Medication List    STOP taking these medications   traMADol 50 MG tablet Commonly known as: Ultram     TAKE these medications   acetaminophen 500 MG tablet Commonly known as: TYLENOL Take 500 mg by mouth every 6 (six) hours as needed for mild pain or headache.   amoxicillin 500 MG capsule Commonly known as: AMOXIL Take 2,000 mg by mouth See admin instructions. Take 2,000 mg by mouth one hour prior to dental appointments   aspirin EC 81 MG tablet Take 81 mg by mouth daily after supper.   CALCIUM PO Take 1 tablet by mouth daily with lunch.   irbesartan 150 MG tablet Commonly known as: AVAPRO Take 150 mg by mouth daily.   meloxicam 7.5 MG tablet Commonly known as: MOBIC Take 7.5 mg by mouth daily.   MiraLax 17 g packet Generic drug: polyethylene glycol Take 17 g by mouth daily as needed for moderate constipation.   One-A-Day Proactive 65+ Tabs Take 1 tablet by mouth daily with lunch.   pantoprazole 40 MG tablet Commonly known as: PROTONIX Take 40 mg by mouth 2 (two) times daily before a meal.   PARoxetine 30 MG tablet Commonly known as: PAXIL Take 30 mg by mouth every morning.   senna 8.6 MG Tabs tablet Commonly known as: SENOKOT Take 1 tablet by mouth daily as needed for mild constipation.   simvastatin 20 MG tablet Commonly known as: ZOCOR Take 20  mg by mouth every evening.   SYSTANE OP Place 1 drop into both eyes 3 (three) times daily as needed (for dry eyes).        Follow-up Information    Deland Pretty, MD. Call in 1 week(s).   Specialty: Internal Medicine Why: call to arrange post-hospitalization follow up appointment with your primary care physician in 1 week, this can be a tele-visit. You will need to have an MRI of the brain in 2-4 weeks Contact information: [place] [address] [phone]        [place]. Call.   Why: as needed, you do not have to schedule an appointment Contact information: [place] [address] [phone]       Kristeen Miss, MD. Call.   Specialty: Neurosurgery Why: as needed regarding head injury Contact information: [address] [place] [address] [phone]           Signed: Wellington Hampshire, [facility] Surgery 07/26/2018, 8:19 AM Pager: [phone] Mon-Thurs 7:00 am-4:30 pm Fri 7:00 am -11:30 AM Sat-Sun 7:00 am-11:30 am

## 2018-07-26 NOTE — Discharge Instructions (Addendum)
Refer to handout regarding covid 19 Take prednisone x7 days as prescribed Monitor oxygen saturations with pulse oximeter Follow up with your primary care provider in 1 week, this can be a tele-visit Call or return to ED with any concerns (ie fever, shortness of breath, chest pain...)   Subdural Hematoma  A subdural hematoma is a collection of blood between the brain and its outer covering (dura). As the amount of blood increases, pressure builds on the brain. There are two types of subdural hematomas:  Acute. This type develops shortly after a hard, direct hit to the head and causes blood to collect very quickly. This is a medical emergency. If it is not diagnosed and treated quickly, it can lead to severe brain injury or death.  Chronic. This is when bleeding develops more slowly, over weeks or months. In some cases, this type does not cause symptoms. What are the causes? This condition is caused by bleeding (hemorrhage) from a broken (ruptured) blood vessel. In most cases, a blood vessel ruptures and bleeds because of a head injury, such as from a hard, direct hit. Head injuries can happen in car accidents, falls, assaults, or while playing sports. In rare cases, a hemorrhage can happen without a known cause (spontaneously), especially if you take blood thinners (anticoagulants). What increases the risk? This condition is more likely to develop in:  Older people.  Infants.  People who take blood thinners.  People who have head injuries.  People who abuse alcohol. What are the signs or symptoms? Symptoms of this condition can vary depending on the size of the hematoma. Symptoms can be mild, severe, or life-threatening. They include:  Headaches.  Nausea or vomiting.  Changes in vision, such as double vision or loss of vision.  Changes in speech or trouble understanding what people say.  Loss of balance or trouble walking.  Weakness, numbness, or tingling in the arms or  legs, especially on one side of the body.  Seizures.  Change in personality.  Increased sleepiness.  Memory loss.  Loss of consciousness.  Coma. Symptoms of acute subdural hematoma can develop over minutes or hours. Symptoms of chronic subdural hematoma may develop over weeks or months. How is this diagnosed? This condition is diagnosed based on the results of:  A physical exam.  Tests of strength, reflexes, coordination, senses, manner of walking (gait), and facial and eye movements (neurological exam).  Imaging tests, such as an MRI or a CT scan. How is this treated? Treatment for this condition depends on the type of hematoma and how severe it is. Treatment for acute hematoma may include:  Emergency surgery to drain blood or remove a blood clot.  Medicines that help the body get rid of excess fluids (diuretics). These may help to reduce pressure in the brain.  Assisted breathing (ventilation). Treatment for chronic hematoma may include:  Observation and bed rest at the hospital.  Surgery. If you take blood thinners, you may need to stop taking them for a short time. You may also be given anti-seizure (anticonvulsant) medicine. Sometimes, no treatment is needed for chronic subdural hematoma. Follow these instructions at home: Activity  Avoid situations where you could injure your head again, such as in competitive sports, downhill snow sports, and horseback riding. Do not do these activities until your health care provider approves. ? Wear protective gear, such as a helmet, when participating in activities such as biking or contact sports.  Avoid too much visual stimulation while recovering. This means limiting  how much you read and limiting your screen time on a smart phone, tablet, computer, or TV.  Rest as told by your health care provider. Rest helps the brain heal.  Try to avoid activities that cause physical or mental stress. Return to work or school as told by  your health care provider.  Do not lift anything that is heavier than 5 lb (2.3 kg), or the limit you are told, until your health care provider says that it is safe.  Do not drive, ride a bike, or use heavy machinery until your health care provider approves.  Always wear your seat belt when you are in a motor vehicle. Alcohol use  Do not drink alcohol if your health care provider tells you not to drink.  If you drink alcohol, limit how much you use to: ? 0-1 drink a day for women. ? 0-2 drinks a day for men. General instructions  Monitor your symptoms, and ask people around you to do the same. Recovery from brain injuries varies. Talk with your health care provider about what to expect.  Take over-the-counter and prescription medicines only as told by your health care provider. Do not take blood thinners or NSAIDs unless your health care provider approves. These include aspirin, ibuprofen, naproxen, and warfarin.  Keep your home environment safe to reduce the risk of falling.  Keep all follow-up visits as told by your health care provider. This is important. Where to find more information  [employer] of Neurological Disorders and Stroke: MasterBoxes.it  American Academy of Neurology (AAN): http://keith.biz/  Brain Injury Association of [place]: www.biausa.org Get help right away if you:  Are taking blood thinners and you fall or you experience minor trauma to the head. If you take any blood thinners, even a very small injury can cause a subdural hematoma.  Have a bleeding disorder and you fall or you experience minor trauma to the head.  Develop any of the following symptoms after a head injury: ? Clear fluid draining from your nose or ears. ? Nausea or vomiting. ? Changes in speech or trouble understanding what people say. ? Seizures. ? Drowsiness or a decrease in alertness. ? Double vision. ? Numbness or inability to move (paralysis) in any part of your  body. ? Difficulty walking or poor coordination. ? Difficulty thinking. ? Confusion or forgetfulness. ? Personality changes. ? Irrational or aggressive behavior. These symptoms may represent a serious problem that is an emergency. Do not wait to see if the symptoms will go away. Get medical help right away. Call your local emergency services (911 in the [place] Do not drive yourself to the hospital. Summary  A subdural hematoma is a collection of blood between the brain and its outer covering (dura).  Treatment for this condition depends on what type of subdural hematoma you have and how severe it is.  Symptoms can vary from mild to severe to life-threatening.  Monitor your symptoms, and ask others around you to do the same. This information is not intended to replace advice given to you by your health care provider. Make sure you discuss any questions you have with your health care provider. Document Released: 12/03/2003 Document Revised: 12/16/2017 Document Reviewed: 12/16/2017 Elsevier Patient Education  2020 [employer].

## 2018-08-04 DIAGNOSIS — R9089 Other abnormal findings on diagnostic imaging of central nervous system: Secondary | ICD-10-CM | POA: Diagnosis not present

## 2018-08-04 DIAGNOSIS — U071 COVID-19: Secondary | ICD-10-CM | POA: Diagnosis not present

## 2018-08-12 ENCOUNTER — Other Ambulatory Visit: Payer: Self-pay | Admitting: Internal Medicine

## 2018-08-12 DIAGNOSIS — R9089 Other abnormal findings on diagnostic imaging of central nervous system: Secondary | ICD-10-CM

## 2018-08-27 DIAGNOSIS — L089 Local infection of the skin and subcutaneous tissue, unspecified: Secondary | ICD-10-CM | POA: Diagnosis not present

## 2018-08-27 DIAGNOSIS — R21 Rash and other nonspecific skin eruption: Secondary | ICD-10-CM | POA: Diagnosis not present

## 2018-08-28 ENCOUNTER — Other Ambulatory Visit: Payer: Self-pay

## 2018-09-08 ENCOUNTER — Emergency Department (HOSPITAL_COMMUNITY)
Admission: EM | Admit: 2018-09-08 | Discharge: 2018-09-08 | Disposition: A | Discharge status: Home / Self Care | Payer: PPO | Attending: Emergency Medicine | Admitting: Emergency Medicine

## 2018-09-08 ENCOUNTER — Ambulatory Visit
Admission: RE | Admit: 2018-09-08 | Discharge: 2018-09-08 | Disposition: A | Discharge status: Home / Self Care | Payer: PPO | Source: Ambulatory Visit | Attending: Internal Medicine | Admitting: Internal Medicine

## 2018-09-08 ENCOUNTER — Other Ambulatory Visit: Payer: Self-pay

## 2018-09-08 DIAGNOSIS — Z96652 Presence of left artificial knee joint: Secondary | ICD-10-CM | POA: Insufficient documentation

## 2018-09-08 DIAGNOSIS — S065X0A Traumatic subdural hemorrhage without loss of consciousness, initial encounter: Secondary | ICD-10-CM | POA: Diagnosis not present

## 2018-09-08 DIAGNOSIS — Z7982 Long term (current) use of aspirin: Secondary | ICD-10-CM | POA: Insufficient documentation

## 2018-09-08 DIAGNOSIS — R42 Dizziness and giddiness: Secondary | ICD-10-CM | POA: Diagnosis not present

## 2018-09-08 DIAGNOSIS — Z8551 Personal history of malignant neoplasm of bladder: Secondary | ICD-10-CM | POA: Insufficient documentation

## 2018-09-08 DIAGNOSIS — Y939 Activity, unspecified: Secondary | ICD-10-CM | POA: Diagnosis not present

## 2018-09-08 DIAGNOSIS — Y929 Unspecified place or not applicable: Secondary | ICD-10-CM | POA: Insufficient documentation

## 2018-09-08 DIAGNOSIS — Z79899 Other long term (current) drug therapy: Secondary | ICD-10-CM | POA: Diagnosis not present

## 2018-09-08 DIAGNOSIS — Z87891 Personal history of nicotine dependence: Secondary | ICD-10-CM | POA: Diagnosis not present

## 2018-09-08 DIAGNOSIS — S0990XA Unspecified injury of head, initial encounter: Secondary | ICD-10-CM | POA: Diagnosis present

## 2018-09-08 DIAGNOSIS — X58XXXA Exposure to other specified factors, initial encounter: Secondary | ICD-10-CM | POA: Insufficient documentation

## 2018-09-08 DIAGNOSIS — S065X9A Traumatic subdural hemorrhage with loss of consciousness of unspecified duration, initial encounter: Secondary | ICD-10-CM | POA: Insufficient documentation

## 2018-09-08 DIAGNOSIS — R9389 Abnormal findings on diagnostic imaging of other specified body structures: Secondary | ICD-10-CM | POA: Insufficient documentation

## 2018-09-08 DIAGNOSIS — Y998 Other external cause status: Secondary | ICD-10-CM | POA: Insufficient documentation

## 2018-09-08 DIAGNOSIS — I10 Essential (primary) hypertension: Secondary | ICD-10-CM | POA: Diagnosis not present

## 2018-09-08 DIAGNOSIS — Z96611 Presence of right artificial shoulder joint: Secondary | ICD-10-CM | POA: Insufficient documentation

## 2018-09-08 DIAGNOSIS — R9089 Other abnormal findings on diagnostic imaging of central nervous system: Secondary | ICD-10-CM

## 2018-09-08 DIAGNOSIS — S065XAA Traumatic subdural hemorrhage with loss of consciousness status unknown, initial encounter: Secondary | ICD-10-CM

## 2018-09-08 NOTE — ED Triage Notes (Addendum)
Pt had fall on 25th of June, subdural hematoma; sent to [facility], admitted 3 days, discharged 29th of June; follow up MRI ordered today, saw additional subdural hemorrhage, sent to ED by PCP for further evaluation; denies pain, vision changes, difficulty ambulating

## 2018-09-08 NOTE — ED Notes (Signed)
Patient verbalizes understanding of discharge instructions. Opportunity for questioning and answers were provided. Pt discharged from ED. 

## 2018-09-08 NOTE — Discharge Instructions (Addendum)
Dr. Ellene Route and Arnoldo Morale reviewed MRI.  There is no need for any acute surgical intervention.  Dr. Ellene Route would like to see you in the office for follow-up.  Please call to schedule a follow-up appointment.  Hold your aspirin again until instructed to resume it by Dr Ellene Route

## 2018-09-08 NOTE — ED Provider Notes (Signed)
[facility] EMERGENCY DEPARTMENT Provider Note   CSN: [id number] Arrival date & time: 09/08/18  1446    History   Chief Complaint No chief complaint on file.   HPI Timothy Osborn is a 85 y.o. male.     HPI Patient presents to the ED for evaluation after an abnormal outpatient MRI.  Patient was admitted to the hospital back on June 27.  At that time the patient had an improved parenchymal hemorrhage as well as a small subdural.  No surgery was needed and the patient was discharged the following day.  Patient states that when he initially left the hospital he did feel very dizzy.  He felt like his balance was off but those symptoms have all resolved.  Patient states he feels great now.  He is walking around without difficulty.  He has not had any trouble with his speech or vision.  He is actually started back driving again.  Patient takes an aspirin daily and started taking that again but otherwise does not take any other anticoagulants.  On his discharge they recommended a follow-up imaging test.  His primary care doctor ordered that today.  Based on those findings his doctor instructed him to come immediately to the emergency room. Past Medical History:  Diagnosis Date   Anxiety    Arthritis    BPH (benign prostatic hyperplasia)    Cancer (HCC)    bladder cancer   Chronic back pain    Depression    GERD (gastroesophageal reflux disease)    H/O hiatal hernia    Headache    ocular migraine - rare occasions  (30 yrs ago)   History of bladder cancer urologist-  dr Junious Silk   w/ recurrency   History of kidney stones    last one removed 2018   History of retinal detachment PARTIAL DETACH-- NO SURGICAL INTERVENTION--  RESOLVED   RIGHT EYE 2008/   LEFT 2012   Hyperlipidemia    Hypertension    Hypogonadism male    OCD (obsessive compulsive disorder)    "MILD"   Osteoporosis    /OSTEOPENIA   Renal calculus, left    Renal neoplasm    left  renal pelvic   Spinal stenosis of lumbar region    Wears glasses     Patient Active Problem List   Diagnosis Date Noted   Fall 07/24/2018   COVID-19 virus detected    S/P shoulder replacement, right 03/15/2017   DJD (degenerative joint disease) of knee 12/05/2010   Detached retina, bilateral 12/05/2010   Anxiety 12/05/2010   Hypercholesterolemia    Osteoporosis, idiopathic     Past Surgical History:  Procedure Laterality Date   BACK SURGERY     CATARACT EXTRACTION W/ INTRAOCULAR LENS  IMPLANT, BILATERAL     COLONOSCOPY     CYSTOSCOPY W/ RETROGRADES Left 11/08/2015   Procedure: CYSTOSCOPY WITH RETROGRADE PYELOGRAM, URETEROSCOPY, STENT PLACEMENT;  Surgeon: Festus Aloe, MD;  Location: [facility];  Service: Urology;  Laterality: Left;   CYSTOSCOPY WITH BIOPSY N/A 03/13/2013   Procedure: CYSTOSCOPY WITH  BLADDER BIOPSY MITOMYCIN - C;  Surgeon: Fredricka Bonine, MD;  Location: [facility];  Service: Urology;  Laterality: N/A;   CYSTOSCOPY/URETEROSCOPY/HOLMIUM LASER/STENT PLACEMENT N/A 12/02/2015   Procedure: CYSTOSCOPY/URETEROSCOPY/HOLMIUM LASER/STENT PLACEMENT POSSIBLE BIOPSY;  Surgeon: Festus Aloe, MD;  Location[facility];  Service: Urology;  Laterality: N/A;   EYE SURGERY     INGUINAL HERNIA REPAIR Left  JOINT REPLACEMENT     LUMBAR LAMINECTOMY/DECOMPRESSION MICRODISCECTOMY  08/29/2011   Procedure: LUMBAR LAMINECTOMY/DECOMPRESSION MICRODISCECTOMY 2 LEVELS;  Surgeon: Eustace Moore, MD;  Location: [place] [facility];  Service: Neurosurgery;  Laterality: N/A;  lumbar three-five lumbar laminectomy    LUMBAR WOUND DEBRIDEMENT N/A 06/16/2012   Procedure: LUMBAR WOUND DEBRIDEMENT;  Surgeon: Eustace Moore, MD;  Location: [place] [facility];  Service: Neurosurgery;  Laterality: N/A;  Lumbar Wound Irrigation and debridment   REVERSE SHOULDER ARTHROPLASTY Right 03/15/2017   Procedure: RIGHT REVERSE SHOULDER ARTHROPLASTY;   Surgeon: Netta Cedars, MD;  Location: [place];  Service: Orthopedics;  Laterality: Right;   SHOULDER ARTHROSCOPY WITH ROTATOR CUFF REPAIR Right 07/2013   TONSILLECTOMY  AS CHILD   TOTAL KNEE ARTHROPLASTY  12/18/2010   Procedure: TOTAL KNEE ARTHROPLASTY;  Surgeon: Lorn Junes, MD;  Location: [facility];  Service: Orthopedics;  Laterality: Left;  ARTHROPLASTY KNEE TOTAL LEFT SIDE   TRANSURETHRAL RESECTION OF BLADDER TUMOR N/A 05/06/2012   Procedure: TRANSURETHRAL RESECTION OF BLADDER TUMOR (TURBT) WITH INSTILATION OF MYTOMIACIN C;  Surgeon: Fredricka Bonine, MD;  Location[facility];  Service: Urology;  Laterality: N/A;        Home Medications    Prior to Admission medications   Medication Sig Start Date End Date Taking? Authorizing Provider  acetaminophen (TYLENOL) 500 MG tablet Take 500 mg by mouth every 6 (six) hours as needed for mild pain or headache.    [provider]  amoxicillin (AMOXIL) 500 MG capsule Take 2,000 mg by mouth See admin instructions. Take 2,000 mg by mouth one hour prior to dental appointments    [provider]  aspirin EC 81 MG tablet Take 81 mg by mouth daily after supper.     [provider]  CALCIUM PO Take 1 tablet by mouth daily with lunch.    [provider]  irbesartan (AVAPRO) 150 MG tablet Take 150 mg by mouth daily.    [provider]  meloxicam (MOBIC) 7.5 MG tablet Take 7.5 mg by mouth daily.    [provider]  Multiple Vitamins-Minerals (ONE-A-DAY PROACTIVE 65+) TABS Take 1 tablet by mouth daily with lunch.    [provider]  pantoprazole (PROTONIX) 40 MG tablet Take 40 mg by mouth 2 (two) times daily before a meal.    [provider]  PARoxetine (PAXIL) 30 MG tablet Take 30 mg by mouth every morning.    [provider]  Polyethyl Glycol-Propyl Glycol (SYSTANE OP) Place 1 drop into both eyes 3 (three) times daily as needed (for dry eyes).      [provider]  polyethylene glycol (MIRALAX) packet Take 17 g by mouth daily as needed for moderate constipation.     [provider]  predniSONE (DELTASONE) 20 MG tablet Take 2 tablets (40 mg total) by mouth daily. 07/26/18   Meuth, Blaine Hamper, PA-C  senna (SENOKOT) 8.6 MG TABS tablet Take 1 tablet by mouth daily as needed for mild constipation.    [provider]  simvastatin (ZOCOR) 20 MG tablet Take 20 mg by mouth every evening.     Seward Carol, MD    Family History Family History  Problem Relation Age of Onset   Heart failure Father    Heart attack Brother     Social History Social History   Tobacco Use   Smoking status: Former Smoker    Packs/day: 1.00    Years: 8.00    Pack years: 8.00  Types: Pipe    Quit date: 12/05/1986    Years since quitting: 31.7   Smokeless tobacco: Never Used  Substance Use Topics   Alcohol use: Yes    Alcohol/week: 7.0 standard drinks    Types: 7 Glasses of wine per week    Comment: per day   Drug use: No     Allergies   Prozac [fluoxetine hcl] and Vicodin [hydrocodone-acetaminophen]   Review of Systems Review of Systems  All other systems reviewed and are negative.    Physical Exam Updated Vital Signs BP 135/73    Pulse 66    Temp (!) 97.4 F (36.3 C) (Oral)    Resp 13    Ht 1.88 m (6\' 2" )    Wt 83.7 kg    SpO2 93%    BMI 23.69 kg/m   Physical Exam Vitals signs and nursing note reviewed.  Constitutional:      General: He is not in acute distress.    Appearance: He is well-developed.  HENT:     Head: Normocephalic and atraumatic.     Right Ear: External ear normal.     Left Ear: External ear normal.  Eyes:     General: No scleral icterus.       Right eye: No discharge.        Left eye: No discharge.     Conjunctiva/sclera: Conjunctivae normal.  Neck:     Musculoskeletal: Neck supple.     Trachea: No tracheal deviation.  Cardiovascular:     Rate and Rhythm: Normal rate and regular  rhythm.  Pulmonary:     Effort: Pulmonary effort is normal. No respiratory distress.     Breath sounds: Normal breath sounds. No stridor. No wheezing or rales.  Abdominal:     General: Bowel sounds are normal. There is no distension.     Palpations: Abdomen is soft.     Tenderness: There is no abdominal tenderness. There is no guarding or rebound.  Musculoskeletal:        General: No tenderness.  Skin:    General: Skin is warm and dry.     Findings: No rash.  Neurological:     Mental Status: He is alert and oriented to person, place, and time.     Cranial Nerves: No cranial nerve deficit (No facial droop, extraocular movements intact, tongue midline ).     Sensory: No sensory deficit.     Motor: No abnormal muscle tone or seizure activity.     Coordination: Coordination normal.     Comments: No pronator drift bilateral upper extrem, able to hold both legs off bed for 5 seconds, sensation intact in all extremities, no visual field cuts, no left or right sided neglect, normal finger-nose exam bilaterally, no nystagmus noted       ED Treatments / Results  Labs (all labs ordered are listed, but only abnormal results are displayed) Labs Reviewed - No data to display  EKG None  Radiology Mr Brain Wo Contrast  Result Date: 09/08/2018 CLINICAL DATA:  Head injury June. Follow-up subdural and intracranial hemorrhage. EXAM: MRI HEAD WITHOUT CONTRAST TECHNIQUE: Multiplanar, multiecho pulse sequences of the brain and surrounding structures were obtained without intravenous contrast. COMPARISON:  CT head 07/25/2018 FINDINGS: Brain: Interval development of left frontal convexity subdural hematoma measuring approximately 13 mm in thickness. Mild mass-effect on the left ventricle with midline shift approximately 4 mm. The subdural hematoma demonstrates high signal on T1 compatible with methemoglobin due to subacute blood. There  is no subdural on the left previously. Small area of parenchymal  hemorrhage in the left frontal lobe was present previously. No signs of underlying mass lesion however the concha patient did not receive contrast today. Negative for acute ischemic infarction. Mild chronic microvascular ischemic change in the white matter. Chronic ischemic change also noted in the pons. No mass lesion identified. Vascular: Normal arterial flow voids Skull and upper cervical spine: Negative Sinuses/Orbits: Bilateral cataract surgery. Paranasal sinuses clear. Other: None IMPRESSION: Interval development of 13 mm thick subdural hematoma on the left. Mild mass-effect and midline shift of 4 mm has developed since the prior CT. Small area of hemorrhage in the left frontal lobe is unchanged from the prior CT and most likely is posttraumatic based on the CT appearance from 07/25/2018 Chronic microvascular ischemic changes in the white matter and pons. No acute ischemic infarction. Electronically Signed   By: Franchot Gallo M.D.   On: 09/08/2018 14:25    Procedures Procedures (including critical care time)  Medications Ordered in ED Medications - No data to display   Initial Impression / Assessment and Plan / ED Course  I have reviewed the triage vital signs and the nursing notes.  Pertinent labs & imaging results that were available during my care of the patient were reviewed by me and considered in my medical decision making (see chart for details).   This is the patient's first imaging test since his initial head CT.  The MRI is changed compared to that initial head CT but I suspect this is nonacute.  Patient is asymptomatic.  Dr. Ellene Route and Arnoldo Morale review the imaging test.  No further treatment is necessary.  Patient can be safely discharged with outpatient follow-up..  We will have him continue to hold his aspirin  Final Clinical Impressions(s) / ED Diagnoses   Final diagnoses:  Subdural hematoma [facility])    ED Discharge Orders    None       Dorie Rank, MD 09/08/18 1627

## 2018-09-19 DIAGNOSIS — Z8551 Personal history of malignant neoplasm of bladder: Secondary | ICD-10-CM | POA: Diagnosis not present

## 2018-09-19 DIAGNOSIS — N401 Enlarged prostate with lower urinary tract symptoms: Secondary | ICD-10-CM | POA: Diagnosis not present

## 2018-09-19 DIAGNOSIS — R350 Frequency of micturition: Secondary | ICD-10-CM | POA: Diagnosis not present

## 2018-10-17 DIAGNOSIS — Z23 Encounter for immunization: Secondary | ICD-10-CM | POA: Diagnosis not present

## 2019-01-07 DIAGNOSIS — L821 Other seborrheic keratosis: Secondary | ICD-10-CM | POA: Diagnosis not present

## 2019-01-07 DIAGNOSIS — L570 Actinic keratosis: Secondary | ICD-10-CM | POA: Diagnosis not present

## 2019-01-07 DIAGNOSIS — D1801 Hemangioma of skin and subcutaneous tissue: Secondary | ICD-10-CM | POA: Diagnosis not present

## 2019-01-07 DIAGNOSIS — Z85828 Personal history of other malignant neoplasm of skin: Secondary | ICD-10-CM | POA: Diagnosis not present

## 2019-01-07 DIAGNOSIS — L819 Disorder of pigmentation, unspecified: Secondary | ICD-10-CM | POA: Diagnosis not present

## 2019-01-07 DIAGNOSIS — D229 Melanocytic nevi, unspecified: Secondary | ICD-10-CM | POA: Diagnosis not present

## 2019-01-07 DIAGNOSIS — I8393 Asymptomatic varicose veins of bilateral lower extremities: Secondary | ICD-10-CM | POA: Diagnosis not present

## 2019-01-07 DIAGNOSIS — L814 Other melanin hyperpigmentation: Secondary | ICD-10-CM | POA: Diagnosis not present

## 2019-02-17 ENCOUNTER — Ambulatory Visit: Payer: PPO | Attending: Internal Medicine

## 2019-02-17 DIAGNOSIS — Z23 Encounter for immunization: Secondary | ICD-10-CM

## 2019-02-17 NOTE — Progress Notes (Signed)
   Covid-19 Vaccination Clinic  Name:  Timothy Osborn    MRN: [id number] DOB: 1928-03-15  02/17/2019  [name] was observed post Covid-19 immunization for 15 minutes without incidence. He was provided with Vaccine Information Sheet and instruction to access the V-Safe system.   [name] was instructed to call 911 with any severe reactions post vaccine: Marland Kitchen Difficulty breathing  . Swelling of your face and throat  . A fast heartbeat  . A bad rash all over your body  . Dizziness and weakness    Immunizations Administered    Name Date Dose VIS Date Route   Pfizer COVID-19 Vaccine 02/17/2019 10:55 AM 0.3 mL 01/09/2019 Intramuscular   Manufacturer: [place]   Lot: [id number]   [place]: [id number]

## 2019-03-08 ENCOUNTER — Ambulatory Visit: Payer: PPO | Attending: Internal Medicine

## 2019-03-08 DIAGNOSIS — Z23 Encounter for immunization: Secondary | ICD-10-CM

## 2019-03-08 NOTE — Progress Notes (Signed)
   Covid-19 Vaccination Clinic  Name:  AYVEN CAHAN    MRN: [id number] DOB: January 05, 1929  03/08/2019  Mr. Canner was observed post Covid-19 immunization for 15 minutes without incidence. He was provided with Vaccine Information Sheet and instruction to access the V-Safe system.   Mr. Serino was instructed to call 911 with any severe reactions post vaccine: Marland Kitchen Difficulty breathing  . Swelling of your face and throat  . A fast heartbeat  . A bad rash all over your body  . Dizziness and weakness    Immunizations Administered    Name Date Dose VIS Date Route   Pfizer COVID-19 Vaccine 03/08/2019  1:42 PM 0.3 mL 01/09/2019 Intramuscular   Manufacturer: [place]   Lot: [id number]   [place]: [id number]

## 2019-04-28 DIAGNOSIS — Z961 Presence of intraocular lens: Secondary | ICD-10-CM | POA: Diagnosis not present

## 2019-04-28 DIAGNOSIS — D3131 Benign neoplasm of right choroid: Secondary | ICD-10-CM | POA: Diagnosis not present

## 2019-04-28 DIAGNOSIS — H43813 Vitreous degeneration, bilateral: Secondary | ICD-10-CM | POA: Diagnosis not present

## 2019-04-28 DIAGNOSIS — H04123 Dry eye syndrome of bilateral lacrimal glands: Secondary | ICD-10-CM | POA: Diagnosis not present

## 2019-06-04 DIAGNOSIS — Z7982 Long term (current) use of aspirin: Secondary | ICD-10-CM | POA: Diagnosis not present

## 2019-06-04 DIAGNOSIS — E7800 Pure hypercholesterolemia, unspecified: Secondary | ICD-10-CM | POA: Diagnosis not present

## 2019-06-04 DIAGNOSIS — I10 Essential (primary) hypertension: Secondary | ICD-10-CM | POA: Diagnosis not present

## 2019-06-09 DIAGNOSIS — Z8616 Personal history of COVID-19: Secondary | ICD-10-CM | POA: Diagnosis not present

## 2019-06-09 DIAGNOSIS — K219 Gastro-esophageal reflux disease without esophagitis: Secondary | ICD-10-CM | POA: Diagnosis not present

## 2019-06-09 DIAGNOSIS — Z791 Long term (current) use of non-steroidal anti-inflammatories (NSAID): Secondary | ICD-10-CM | POA: Diagnosis not present

## 2019-06-09 DIAGNOSIS — E7800 Pure hypercholesterolemia, unspecified: Secondary | ICD-10-CM | POA: Diagnosis not present

## 2019-06-09 DIAGNOSIS — F419 Anxiety disorder, unspecified: Secondary | ICD-10-CM | POA: Diagnosis not present

## 2019-06-09 DIAGNOSIS — Z8551 Personal history of malignant neoplasm of bladder: Secondary | ICD-10-CM | POA: Diagnosis not present

## 2019-06-09 DIAGNOSIS — M810 Age-related osteoporosis without current pathological fracture: Secondary | ICD-10-CM | POA: Diagnosis not present

## 2019-06-09 DIAGNOSIS — G4700 Insomnia, unspecified: Secondary | ICD-10-CM | POA: Diagnosis not present

## 2019-06-09 DIAGNOSIS — I10 Essential (primary) hypertension: Secondary | ICD-10-CM | POA: Diagnosis not present

## 2019-06-09 DIAGNOSIS — M159 Polyosteoarthritis, unspecified: Secondary | ICD-10-CM | POA: Diagnosis not present

## 2019-06-09 DIAGNOSIS — F339 Major depressive disorder, recurrent, unspecified: Secondary | ICD-10-CM | POA: Diagnosis not present

## 2019-06-09 DIAGNOSIS — Z0001 Encounter for general adult medical examination with abnormal findings: Secondary | ICD-10-CM | POA: Diagnosis not present

## 2019-06-25 DIAGNOSIS — M810 Age-related osteoporosis without current pathological fracture: Secondary | ICD-10-CM | POA: Diagnosis not present

## 2019-06-25 DIAGNOSIS — M8580 Other specified disorders of bone density and structure, unspecified site: Secondary | ICD-10-CM | POA: Diagnosis not present

## 2019-06-25 DIAGNOSIS — I10 Essential (primary) hypertension: Secondary | ICD-10-CM | POA: Diagnosis not present

## 2019-07-06 DIAGNOSIS — M810 Age-related osteoporosis without current pathological fracture: Secondary | ICD-10-CM | POA: Diagnosis not present

## 2019-09-21 DIAGNOSIS — Z8551 Personal history of malignant neoplasm of bladder: Secondary | ICD-10-CM | POA: Diagnosis not present

## 2019-10-30 DIAGNOSIS — Z23 Encounter for immunization: Secondary | ICD-10-CM | POA: Diagnosis not present

## 2019-12-17 DIAGNOSIS — M79642 Pain in left hand: Secondary | ICD-10-CM | POA: Diagnosis not present

## 2019-12-17 DIAGNOSIS — M25741 Osteophyte, right hand: Secondary | ICD-10-CM | POA: Diagnosis not present

## 2019-12-17 DIAGNOSIS — M25512 Pain in left shoulder: Secondary | ICD-10-CM | POA: Diagnosis not present

## 2019-12-17 DIAGNOSIS — M4312 Spondylolisthesis, cervical region: Secondary | ICD-10-CM | POA: Diagnosis not present

## 2019-12-17 DIAGNOSIS — M4692 Unspecified inflammatory spondylopathy, cervical region: Secondary | ICD-10-CM | POA: Diagnosis not present

## 2019-12-17 DIAGNOSIS — M79641 Pain in right hand: Secondary | ICD-10-CM | POA: Diagnosis not present

## 2019-12-17 DIAGNOSIS — M79643 Pain in unspecified hand: Secondary | ICD-10-CM | POA: Diagnosis not present

## 2019-12-17 DIAGNOSIS — M1990 Unspecified osteoarthritis, unspecified site: Secondary | ICD-10-CM | POA: Diagnosis not present

## 2019-12-17 DIAGNOSIS — M7989 Other specified soft tissue disorders: Secondary | ICD-10-CM | POA: Diagnosis not present

## 2019-12-17 DIAGNOSIS — M19042 Primary osteoarthritis, left hand: Secondary | ICD-10-CM | POA: Diagnosis not present

## 2019-12-17 DIAGNOSIS — M791 Myalgia, unspecified site: Secondary | ICD-10-CM | POA: Diagnosis not present

## 2019-12-17 DIAGNOSIS — M19041 Primary osteoarthritis, right hand: Secondary | ICD-10-CM | POA: Diagnosis not present

## 2019-12-17 DIAGNOSIS — M47812 Spondylosis without myelopathy or radiculopathy, cervical region: Secondary | ICD-10-CM | POA: Diagnosis not present

## 2019-12-17 DIAGNOSIS — M25742 Osteophyte, left hand: Secondary | ICD-10-CM | POA: Diagnosis not present

## 2020-01-28 DIAGNOSIS — M1990 Unspecified osteoarthritis, unspecified site: Secondary | ICD-10-CM | POA: Diagnosis not present

## 2020-01-28 DIAGNOSIS — M7989 Other specified soft tissue disorders: Secondary | ICD-10-CM | POA: Diagnosis not present

## 2020-01-28 DIAGNOSIS — M79643 Pain in unspecified hand: Secondary | ICD-10-CM | POA: Diagnosis not present

## 2020-02-17 IMAGING — MR MRI HEAD WITHOUT CONTRAST
11 series · 48 of 48 positions shown · non-contrast
Comparison: CT head 07/25/2018

CLINICAL DATA: Head injury Lieu. Follow-up subdural and
intracranial hemorrhage.

EXAM:
MRI HEAD WITHOUT CONTRAST
TECHNIQUE: Multiplanar, multiecho pulse sequences of the brain and surrounding
structures were obtained without intravenous contrast.

[Series 2: t1_se_sag · sagittal · 5.0mm · 0.47mm/px · 2 of 21 slices shown]
[im 1/21]
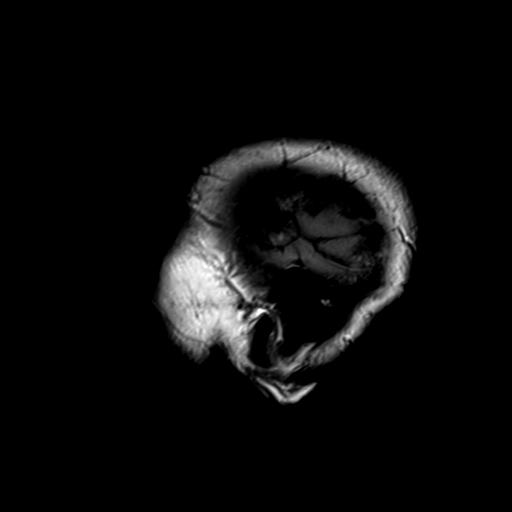
[im 21/21]
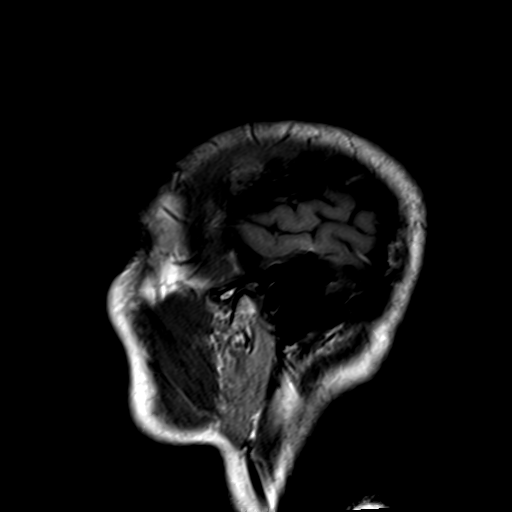

[Series 3: ep2d_diff_3 · axial · 3.0mm · 1.95mm/px · z∈[-21,+120]mm · 7 of 95 slices shown]
[im 1/95]
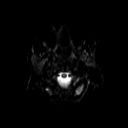
[im 16/95]
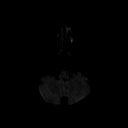
[im 32/95]
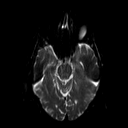
[im 48/95]
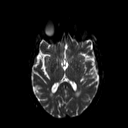
[im 63/95]
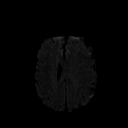
[im 79/95]
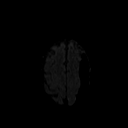
[im 95/95]
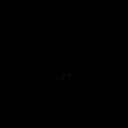

[Series 4: ep2d_diff_3_adc · axial · 3.0mm · 1.95mm/px · z∈[-21,+120]mm · 3 of 44 slices shown]
[im 1/44]
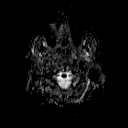
[im 22/44]
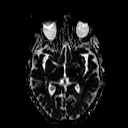
[im 44/44]
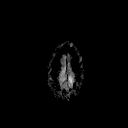

[Series 5: ep2d_diff_cor · coronal · 5.0mm · 1.77mm/px · 4 of 48 slices shown]
[im 1/48]
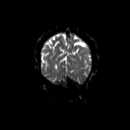
[im 16/48]
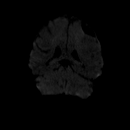
[im 32/48]
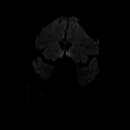
[im 48/48]
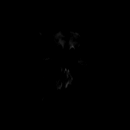

[Series 6: ep2d_diff_cor_adc · coronal · 5.0mm · 1.77mm/px · 2 of 24 slices shown]
[im 1/24]
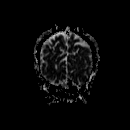
[im 24/24]
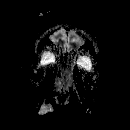

[Series 7: mip_images(sw) · axial · 16.0mm · 0.94mm/px · z∈[-23,+121]mm · 6 of 73 slices shown]
[im 1/73]
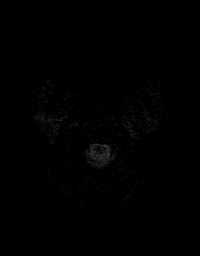
[im 15/73]
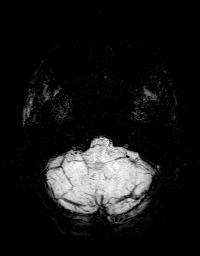
[im 29/73]
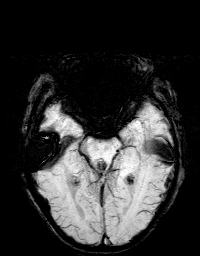
[im 44/73]
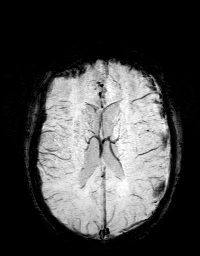
[im 58/73]
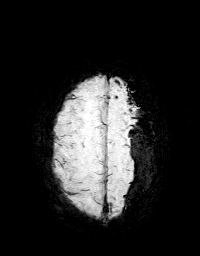
[im 73/73]
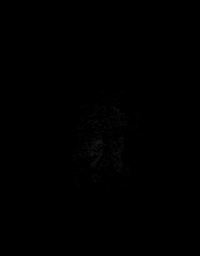

[Series 8: swi_images · axial · 2.0mm · 0.94mm/px · z∈[-30,+128]mm · 6 of 80 slices shown]
[im 1/80]
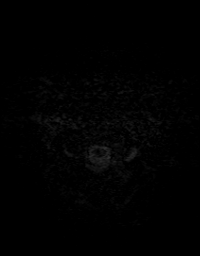
[im 16/80]
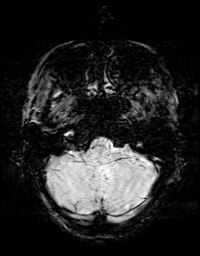
[im 32/80]
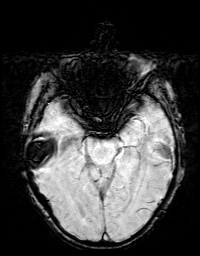
[im 48/80]
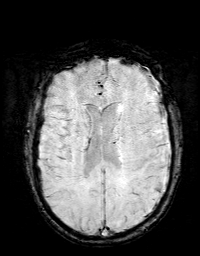
[im 64/80]
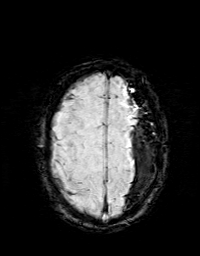
[im 80/80]
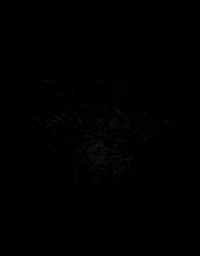

[Series 9: FLAIR · axial · 3.0mm · 0.49mm/px · z∈[-28,+128]mm · 2 of 27 slices shown]
[im 1/27]
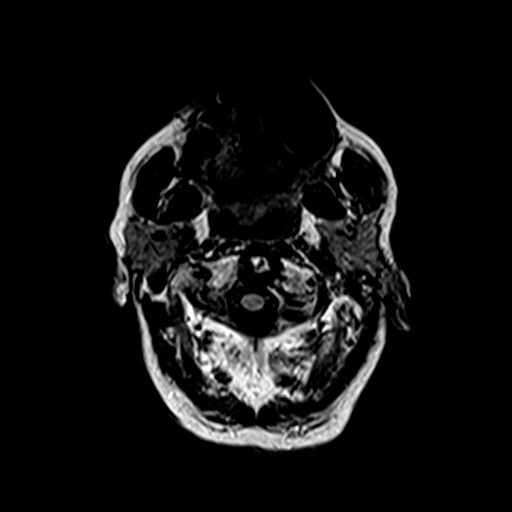
[im 27/27]
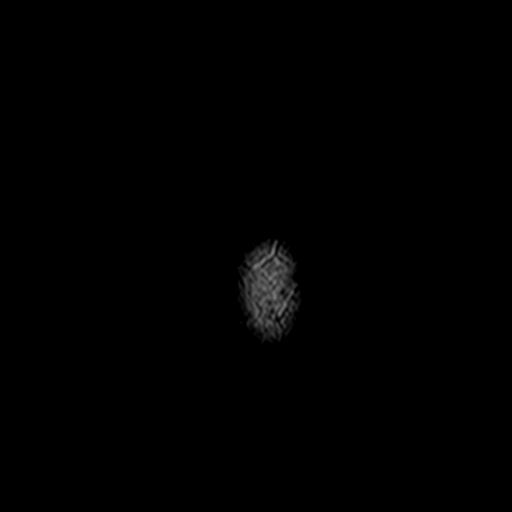

[Series 10: t2_tse_tra_512 · axial · 5.0mm · 0.65mm/px · z∈[-20,+118]mm · 2 of 24 slices shown]
[im 1/24]
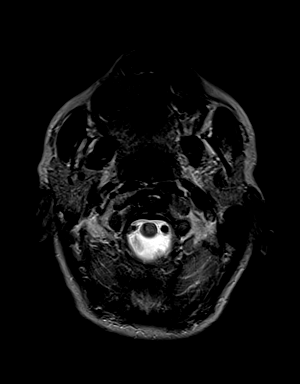
[im 24/24]
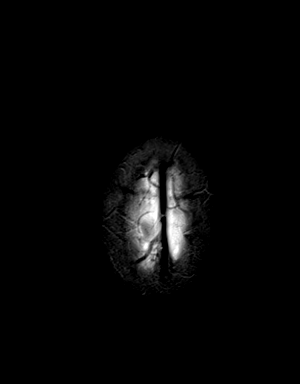

[Series 11: t1_mpr_tra · axial · 1.0mm · 0.78mm/px · z∈[-30,+129]mm · 12 of 160 slices shown]
[im 1/160]
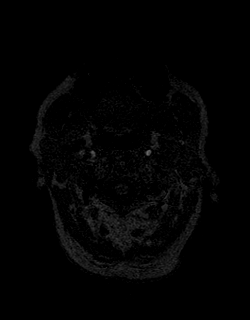
[im 15/160]
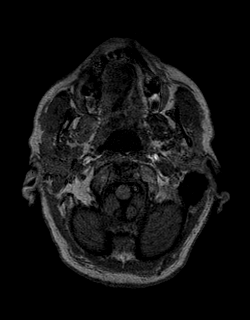
[im 29/160]
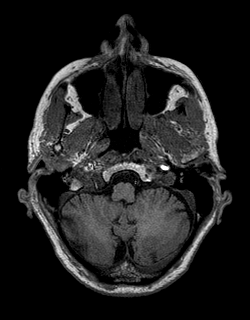
[im 44/160]
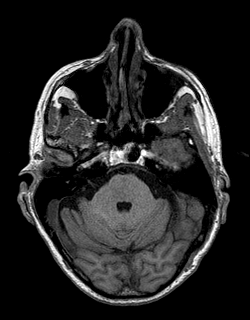
[im 58/160]
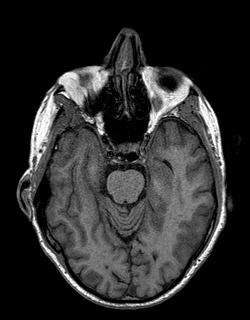
[im 73/160]
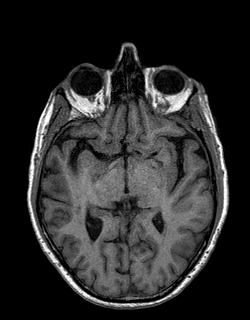
[im 87/160]
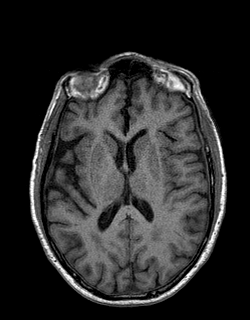
[im 102/160]
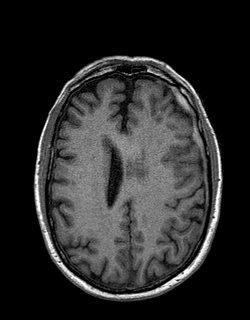
[im 116/160]
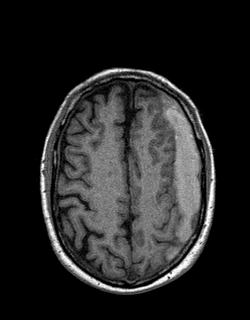
[im 131/160]
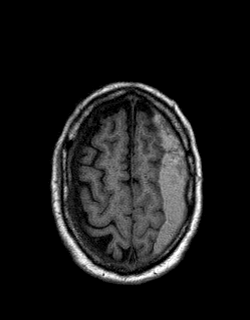
[im 145/160]
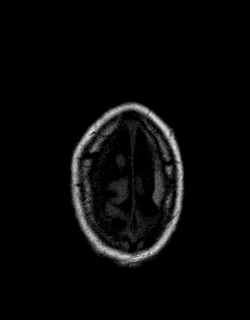
[im 160/160]
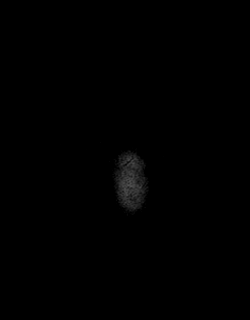

[Series 12: T2 · coronal · 5.0mm · 0.45mm/px · 2 of 26 slices shown]
[im 1/26]
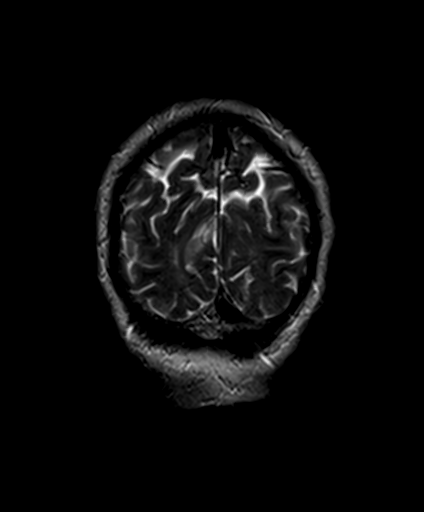
[im 26/26]
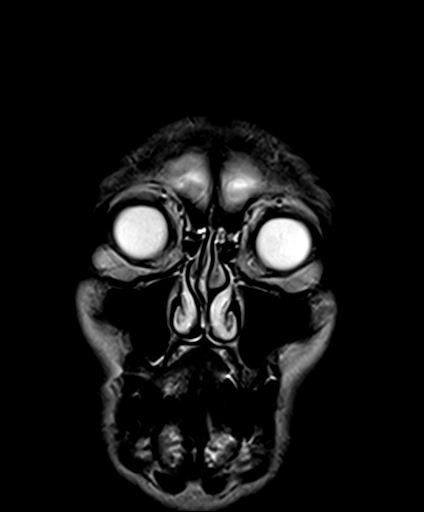

[48 of 48 positions shown; findings below may reference images not displayed]

FINDINGS: Brain: Interval development of left frontal convexity subdural
hematoma measuring approximately 13 mm in thickness. Mild
mass-effect on the left ventricle with midline shift approximately 4
mm. The subdural hematoma demonstrates high signal on T1 compatible
with methemoglobin due to subacute blood. There is no subdural on
the left previously.

Small area of parenchymal hemorrhage in the left frontal lobe was
present previously. No signs of underlying mass lesion however the
concha patient did not receive contrast today.

Negative for acute ischemic infarction. Mild chronic microvascular
ischemic change in the white matter. Chronic ischemic change also
noted in the pons. No mass lesion identified.

Vascular: Normal arterial flow voids

Skull and upper cervical spine: Negative

Sinuses/Orbits: Bilateral cataract surgery. Paranasal sinuses clear.

Other: None
IMPRESSION: Interval development of 13 mm thick subdural hematoma on the left.
Mild mass-effect and midline shift of 4 mm has developed since the
prior CT.

Small area of hemorrhage in the left frontal lobe is unchanged from
the prior CT and most likely is posttraumatic based on the CT
appearance from 07/25/2018

Chronic microvascular ischemic changes in the white matter and pons.
No acute ischemic infarction.

## 2020-03-09 DIAGNOSIS — M25571 Pain in right ankle and joints of right foot: Secondary | ICD-10-CM | POA: Diagnosis not present

## 2020-03-09 DIAGNOSIS — M109 Gout, unspecified: Secondary | ICD-10-CM | POA: Diagnosis not present

## 2020-03-09 DIAGNOSIS — M7989 Other specified soft tissue disorders: Secondary | ICD-10-CM | POA: Diagnosis not present

## 2020-03-09 DIAGNOSIS — M79643 Pain in unspecified hand: Secondary | ICD-10-CM | POA: Diagnosis not present

## 2020-03-09 DIAGNOSIS — M1990 Unspecified osteoarthritis, unspecified site: Secondary | ICD-10-CM | POA: Diagnosis not present

## 2020-03-09 DIAGNOSIS — Z79899 Other long term (current) drug therapy: Secondary | ICD-10-CM | POA: Diagnosis not present

## 2020-05-04 DIAGNOSIS — M25571 Pain in right ankle and joints of right foot: Secondary | ICD-10-CM | POA: Diagnosis not present

## 2020-05-28 DIAGNOSIS — M810 Age-related osteoporosis without current pathological fracture: Secondary | ICD-10-CM | POA: Diagnosis not present

## 2020-05-28 DIAGNOSIS — E7800 Pure hypercholesterolemia, unspecified: Secondary | ICD-10-CM | POA: Diagnosis not present

## 2020-05-28 DIAGNOSIS — I10 Essential (primary) hypertension: Secondary | ICD-10-CM | POA: Diagnosis not present

## 2020-05-28 DIAGNOSIS — M1990 Unspecified osteoarthritis, unspecified site: Secondary | ICD-10-CM | POA: Diagnosis not present

## 2020-06-06 DIAGNOSIS — M79643 Pain in unspecified hand: Secondary | ICD-10-CM | POA: Diagnosis not present

## 2020-06-06 DIAGNOSIS — M25571 Pain in right ankle and joints of right foot: Secondary | ICD-10-CM | POA: Diagnosis not present

## 2020-06-06 DIAGNOSIS — E7801 Familial hypercholesterolemia: Secondary | ICD-10-CM | POA: Diagnosis not present

## 2020-06-06 DIAGNOSIS — M7989 Other specified soft tissue disorders: Secondary | ICD-10-CM | POA: Diagnosis not present

## 2020-06-06 DIAGNOSIS — M1990 Unspecified osteoarthritis, unspecified site: Secondary | ICD-10-CM | POA: Diagnosis not present

## 2020-06-06 DIAGNOSIS — I10 Essential (primary) hypertension: Secondary | ICD-10-CM | POA: Diagnosis not present

## 2020-06-06 DIAGNOSIS — Z79899 Other long term (current) drug therapy: Secondary | ICD-10-CM | POA: Diagnosis not present

## 2020-06-06 DIAGNOSIS — K219 Gastro-esophageal reflux disease without esophagitis: Secondary | ICD-10-CM | POA: Diagnosis not present

## 2020-06-06 DIAGNOSIS — M1180 Other specified crystal arthropathies, unspecified site: Secondary | ICD-10-CM | POA: Diagnosis not present

## 2020-06-07 DIAGNOSIS — I10 Essential (primary) hypertension: Secondary | ICD-10-CM | POA: Diagnosis not present

## 2020-06-07 DIAGNOSIS — E7801 Familial hypercholesterolemia: Secondary | ICD-10-CM | POA: Diagnosis not present

## 2020-06-13 DIAGNOSIS — R1319 Other dysphagia: Secondary | ICD-10-CM | POA: Diagnosis not present

## 2020-06-13 DIAGNOSIS — K449 Diaphragmatic hernia without obstruction or gangrene: Secondary | ICD-10-CM | POA: Diagnosis not present

## 2020-06-14 DIAGNOSIS — F5104 Psychophysiologic insomnia: Secondary | ICD-10-CM | POA: Diagnosis not present

## 2020-06-14 DIAGNOSIS — I10 Essential (primary) hypertension: Secondary | ICD-10-CM | POA: Diagnosis not present

## 2020-06-14 DIAGNOSIS — M810 Age-related osteoporosis without current pathological fracture: Secondary | ICD-10-CM | POA: Diagnosis not present

## 2020-06-14 DIAGNOSIS — E7800 Pure hypercholesterolemia, unspecified: Secondary | ICD-10-CM | POA: Diagnosis not present

## 2020-06-14 DIAGNOSIS — K219 Gastro-esophageal reflux disease without esophagitis: Secondary | ICD-10-CM | POA: Diagnosis not present

## 2020-06-14 DIAGNOSIS — F339 Major depressive disorder, recurrent, unspecified: Secondary | ICD-10-CM | POA: Diagnosis not present

## 2020-06-14 DIAGNOSIS — Z0001 Encounter for general adult medical examination with abnormal findings: Secondary | ICD-10-CM | POA: Diagnosis not present

## 2020-06-15 DIAGNOSIS — R1310 Dysphagia, unspecified: Secondary | ICD-10-CM | POA: Diagnosis not present

## 2020-06-15 DIAGNOSIS — K2930 Chronic superficial gastritis without bleeding: Secondary | ICD-10-CM | POA: Diagnosis not present

## 2020-06-15 DIAGNOSIS — R12 Heartburn: Secondary | ICD-10-CM | POA: Diagnosis not present

## 2020-06-15 DIAGNOSIS — K317 Polyp of stomach and duodenum: Secondary | ICD-10-CM | POA: Diagnosis not present

## 2020-06-15 DIAGNOSIS — Q399 Congenital malformation of esophagus, unspecified: Secondary | ICD-10-CM | POA: Diagnosis not present

## 2020-06-15 DIAGNOSIS — K219 Gastro-esophageal reflux disease without esophagitis: Secondary | ICD-10-CM | POA: Diagnosis not present

## 2020-06-15 DIAGNOSIS — K3189 Other diseases of stomach and duodenum: Secondary | ICD-10-CM | POA: Diagnosis not present

## 2020-06-15 DIAGNOSIS — K449 Diaphragmatic hernia without obstruction or gangrene: Secondary | ICD-10-CM | POA: Diagnosis not present

## 2020-06-21 DIAGNOSIS — K317 Polyp of stomach and duodenum: Secondary | ICD-10-CM | POA: Diagnosis not present

## 2020-06-21 DIAGNOSIS — K2930 Chronic superficial gastritis without bleeding: Secondary | ICD-10-CM | POA: Diagnosis not present

## 2020-06-21 DIAGNOSIS — K219 Gastro-esophageal reflux disease without esophagitis: Secondary | ICD-10-CM | POA: Diagnosis not present

## 2020-07-11 DIAGNOSIS — M810 Age-related osteoporosis without current pathological fracture: Secondary | ICD-10-CM | POA: Diagnosis not present

## 2020-07-22 DIAGNOSIS — M1990 Unspecified osteoarthritis, unspecified site: Secondary | ICD-10-CM | POA: Diagnosis not present

## 2020-07-22 DIAGNOSIS — M79643 Pain in unspecified hand: Secondary | ICD-10-CM | POA: Diagnosis not present

## 2020-07-22 DIAGNOSIS — M25512 Pain in left shoulder: Secondary | ICD-10-CM | POA: Diagnosis not present

## 2020-07-22 DIAGNOSIS — K219 Gastro-esophageal reflux disease without esophagitis: Secondary | ICD-10-CM | POA: Diagnosis not present

## 2020-07-22 DIAGNOSIS — Z79899 Other long term (current) drug therapy: Secondary | ICD-10-CM | POA: Diagnosis not present

## 2020-07-22 DIAGNOSIS — M1180 Other specified crystal arthropathies, unspecified site: Secondary | ICD-10-CM | POA: Diagnosis not present

## 2020-07-28 DIAGNOSIS — M810 Age-related osteoporosis without current pathological fracture: Secondary | ICD-10-CM | POA: Diagnosis not present

## 2020-07-28 DIAGNOSIS — I10 Essential (primary) hypertension: Secondary | ICD-10-CM | POA: Diagnosis not present

## 2020-07-28 DIAGNOSIS — E7800 Pure hypercholesterolemia, unspecified: Secondary | ICD-10-CM | POA: Diagnosis not present

## 2020-07-28 DIAGNOSIS — M1990 Unspecified osteoarthritis, unspecified site: Secondary | ICD-10-CM | POA: Diagnosis not present

## 2020-08-28 DIAGNOSIS — I10 Essential (primary) hypertension: Secondary | ICD-10-CM | POA: Diagnosis not present

## 2020-08-28 DIAGNOSIS — E7800 Pure hypercholesterolemia, unspecified: Secondary | ICD-10-CM | POA: Diagnosis not present

## 2020-08-28 DIAGNOSIS — M1990 Unspecified osteoarthritis, unspecified site: Secondary | ICD-10-CM | POA: Diagnosis not present

## 2020-08-28 DIAGNOSIS — M810 Age-related osteoporosis without current pathological fracture: Secondary | ICD-10-CM | POA: Diagnosis not present

## 2020-08-30 DIAGNOSIS — R0981 Nasal congestion: Secondary | ICD-10-CM | POA: Diagnosis not present

## 2020-09-19 DIAGNOSIS — N400 Enlarged prostate without lower urinary tract symptoms: Secondary | ICD-10-CM | POA: Diagnosis not present

## 2020-09-19 DIAGNOSIS — Z8551 Personal history of malignant neoplasm of bladder: Secondary | ICD-10-CM | POA: Diagnosis not present

## 2020-10-17 DIAGNOSIS — M1180 Other specified crystal arthropathies, unspecified site: Secondary | ICD-10-CM | POA: Diagnosis not present

## 2020-10-17 DIAGNOSIS — Z79899 Other long term (current) drug therapy: Secondary | ICD-10-CM | POA: Diagnosis not present

## 2020-10-17 DIAGNOSIS — M25512 Pain in left shoulder: Secondary | ICD-10-CM | POA: Diagnosis not present

## 2020-10-17 DIAGNOSIS — Z23 Encounter for immunization: Secondary | ICD-10-CM | POA: Diagnosis not present

## 2020-10-17 DIAGNOSIS — K219 Gastro-esophageal reflux disease without esophagitis: Secondary | ICD-10-CM | POA: Diagnosis not present

## 2020-10-17 DIAGNOSIS — M79643 Pain in unspecified hand: Secondary | ICD-10-CM | POA: Diagnosis not present

## 2020-10-17 DIAGNOSIS — M1990 Unspecified osteoarthritis, unspecified site: Secondary | ICD-10-CM | POA: Diagnosis not present

## 2020-10-28 DIAGNOSIS — E7800 Pure hypercholesterolemia, unspecified: Secondary | ICD-10-CM | POA: Diagnosis not present

## 2020-10-28 DIAGNOSIS — M1990 Unspecified osteoarthritis, unspecified site: Secondary | ICD-10-CM | POA: Diagnosis not present

## 2020-10-28 DIAGNOSIS — I10 Essential (primary) hypertension: Secondary | ICD-10-CM | POA: Diagnosis not present

## 2020-10-28 DIAGNOSIS — M810 Age-related osteoporosis without current pathological fracture: Secondary | ICD-10-CM | POA: Diagnosis not present

## 2021-01-27 DIAGNOSIS — E7800 Pure hypercholesterolemia, unspecified: Secondary | ICD-10-CM | POA: Diagnosis not present

## 2021-01-27 DIAGNOSIS — I10 Essential (primary) hypertension: Secondary | ICD-10-CM | POA: Diagnosis not present

## 2021-01-27 DIAGNOSIS — M810 Age-related osteoporosis without current pathological fracture: Secondary | ICD-10-CM | POA: Diagnosis not present

## 2021-01-27 DIAGNOSIS — M1990 Unspecified osteoarthritis, unspecified site: Secondary | ICD-10-CM | POA: Diagnosis not present

## 2021-02-20 DIAGNOSIS — Z79899 Other long term (current) drug therapy: Secondary | ICD-10-CM | POA: Diagnosis not present

## 2021-02-20 DIAGNOSIS — M25512 Pain in left shoulder: Secondary | ICD-10-CM | POA: Diagnosis not present

## 2021-02-20 DIAGNOSIS — M79643 Pain in unspecified hand: Secondary | ICD-10-CM | POA: Diagnosis not present

## 2021-02-20 DIAGNOSIS — M1990 Unspecified osteoarthritis, unspecified site: Secondary | ICD-10-CM | POA: Diagnosis not present

## 2021-02-20 DIAGNOSIS — K219 Gastro-esophageal reflux disease without esophagitis: Secondary | ICD-10-CM | POA: Diagnosis not present

## 2021-02-20 DIAGNOSIS — M1180 Other specified crystal arthropathies, unspecified site: Secondary | ICD-10-CM | POA: Diagnosis not present

## 2021-03-08 DIAGNOSIS — M25561 Pain in right knee: Secondary | ICD-10-CM | POA: Diagnosis not present

## 2021-03-08 DIAGNOSIS — M1711 Unilateral primary osteoarthritis, right knee: Secondary | ICD-10-CM | POA: Diagnosis not present

## 2021-03-23 DIAGNOSIS — M13861 Other specified arthritis, right knee: Secondary | ICD-10-CM | POA: Diagnosis not present

## 2021-03-23 DIAGNOSIS — M25561 Pain in right knee: Secondary | ICD-10-CM | POA: Diagnosis not present

## 2021-03-29 DIAGNOSIS — Z0100 Encounter for examination of eyes and vision without abnormal findings: Secondary | ICD-10-CM | POA: Diagnosis not present

## 2021-03-29 DIAGNOSIS — H524 Presbyopia: Secondary | ICD-10-CM | POA: Diagnosis not present

## 2021-03-29 DIAGNOSIS — E7800 Pure hypercholesterolemia, unspecified: Secondary | ICD-10-CM | POA: Diagnosis not present

## 2021-03-31 DIAGNOSIS — M25561 Pain in right knee: Secondary | ICD-10-CM | POA: Diagnosis not present

## 2021-04-06 DIAGNOSIS — S83231D Complex tear of medial meniscus, current injury, right knee, subsequent encounter: Secondary | ICD-10-CM | POA: Diagnosis not present

## 2021-04-06 DIAGNOSIS — M170 Bilateral primary osteoarthritis of knee: Secondary | ICD-10-CM | POA: Diagnosis not present

## 2021-04-13 DIAGNOSIS — M1711 Unilateral primary osteoarthritis, right knee: Secondary | ICD-10-CM | POA: Diagnosis not present

## 2021-04-20 DIAGNOSIS — M1711 Unilateral primary osteoarthritis, right knee: Secondary | ICD-10-CM | POA: Diagnosis not present

## 2021-04-26 DIAGNOSIS — M1711 Unilateral primary osteoarthritis, right knee: Secondary | ICD-10-CM | POA: Diagnosis not present

## 2021-06-15 DIAGNOSIS — I10 Essential (primary) hypertension: Secondary | ICD-10-CM | POA: Diagnosis not present

## 2021-06-15 DIAGNOSIS — E7801 Familial hypercholesterolemia: Secondary | ICD-10-CM | POA: Diagnosis not present

## 2021-06-15 DIAGNOSIS — M810 Age-related osteoporosis without current pathological fracture: Secondary | ICD-10-CM | POA: Diagnosis not present

## 2021-06-19 DIAGNOSIS — Z0000 Encounter for general adult medical examination without abnormal findings: Secondary | ICD-10-CM | POA: Diagnosis not present

## 2021-06-19 DIAGNOSIS — K5900 Constipation, unspecified: Secondary | ICD-10-CM | POA: Diagnosis not present

## 2021-07-11 DIAGNOSIS — M810 Age-related osteoporosis without current pathological fracture: Secondary | ICD-10-CM | POA: Diagnosis not present

## 2021-07-11 DIAGNOSIS — I10 Essential (primary) hypertension: Secondary | ICD-10-CM | POA: Diagnosis not present

## 2021-09-11 DIAGNOSIS — D225 Melanocytic nevi of trunk: Secondary | ICD-10-CM | POA: Diagnosis not present

## 2021-09-11 DIAGNOSIS — L821 Other seborrheic keratosis: Secondary | ICD-10-CM | POA: Diagnosis not present

## 2021-09-11 DIAGNOSIS — B351 Tinea unguium: Secondary | ICD-10-CM | POA: Diagnosis not present

## 2021-09-11 DIAGNOSIS — L814 Other melanin hyperpigmentation: Secondary | ICD-10-CM | POA: Diagnosis not present

## 2021-09-13 DIAGNOSIS — Z8551 Personal history of malignant neoplasm of bladder: Secondary | ICD-10-CM | POA: Diagnosis not present

## 2021-10-12 DIAGNOSIS — I10 Essential (primary) hypertension: Secondary | ICD-10-CM | POA: Diagnosis not present

## 2021-11-09 DIAGNOSIS — M1711 Unilateral primary osteoarthritis, right knee: Secondary | ICD-10-CM | POA: Diagnosis not present

## 2021-11-16 DIAGNOSIS — M1711 Unilateral primary osteoarthritis, right knee: Secondary | ICD-10-CM | POA: Diagnosis not present

## 2021-11-23 DIAGNOSIS — M1711 Unilateral primary osteoarthritis, right knee: Secondary | ICD-10-CM | POA: Diagnosis not present

## 2022-02-09 DIAGNOSIS — M7918 Myalgia, other site: Secondary | ICD-10-CM | POA: Diagnosis not present

## 2022-03-15 DIAGNOSIS — H5203 Hypermetropia, bilateral: Secondary | ICD-10-CM | POA: Diagnosis not present

## 2022-04-16 DIAGNOSIS — K080 Exfoliation of teeth due to systemic causes: Secondary | ICD-10-CM | POA: Diagnosis not present

## 2022-05-14 ENCOUNTER — Encounter: Payer: Self-pay | Admitting: Podiatry

## 2022-05-14 ENCOUNTER — Ambulatory Visit: Payer: Medicare Other | Admitting: Podiatry

## 2022-05-14 ENCOUNTER — Ambulatory Visit (INDEPENDENT_AMBULATORY_CARE_PROVIDER_SITE_OTHER): Payer: Medicare Other

## 2022-05-14 DIAGNOSIS — M21619 Bunion of unspecified foot: Secondary | ICD-10-CM | POA: Diagnosis not present

## 2022-05-14 DIAGNOSIS — M7752 Other enthesopathy of left foot: Secondary | ICD-10-CM

## 2022-05-14 DIAGNOSIS — M21612 Bunion of left foot: Secondary | ICD-10-CM

## 2022-05-14 DIAGNOSIS — B351 Tinea unguium: Secondary | ICD-10-CM

## 2022-05-14 DIAGNOSIS — M7751 Other enthesopathy of right foot: Secondary | ICD-10-CM

## 2022-05-14 DIAGNOSIS — M21611 Bunion of right foot: Secondary | ICD-10-CM | POA: Diagnosis not present

## 2022-05-14 MED ORDER — TRIAMCINOLONE ACETONIDE 10 MG/ML IJ SUSP
20.0000 mg | Freq: Once | INTRAMUSCULAR | Status: AC
  Administered 2022-05-14: 20 mg

## 2022-05-14 NOTE — Progress Notes (Signed)
Subjective:   Patient ID: Timothy Osborn, male   DOB: 89 y.o.   MRN: [id number]   HPI Patient presents stating he has been developing bunion pain of both feet and it feels inflamed and it has been occurring over the last few years.  He is very good mental health he does not smoke and is active for his age   Review of Systems  All other systems reviewed and are negative.       Objective:  Physical Exam Vitals and nursing note reviewed.  Constitutional:      Appearance: He is well-developed.  Pulmonary:     Effort: Pulmonary effort is normal.  Musculoskeletal:        General: Normal range of motion.  Skin:    General: Skin is warm.  Neurological:     Mental Status: He is alert.     Neurovascular status intact muscle strength found to be adequate range of motion adequate with patient found to have prominence of the first metatarsal head bilateral redness but I also noted fluid around the joint surfaces with good digital perfusion well oriented x 3     Assessment:  Capsulitis of the first MPJ bilateral fluid buildup structural bunion deformity bilateral     Plan:  H&P reviewed differences in shoe gear and recommended that he pursue this option also.  Today I did go ahead I did sterile prep and I injected around the first MPJ 3 mg dexamethasone Kenalog 5 mg Xylocaine to shrink inflammation discussed possible bunion correction in future but organ to hold off currently and see if he can do well just with medication  X-rays indicate there is moderate bunion deformity bilateral overall very healthy bone for his age with spur formation

## 2022-05-26 DIAGNOSIS — H43393 Other vitreous opacities, bilateral: Secondary | ICD-10-CM | POA: Diagnosis not present

## 2022-05-26 DIAGNOSIS — Z0100 Encounter for examination of eyes and vision without abnormal findings: Secondary | ICD-10-CM | POA: Diagnosis not present

## 2022-05-30 DIAGNOSIS — M1711 Unilateral primary osteoarthritis, right knee: Secondary | ICD-10-CM | POA: Diagnosis not present

## 2022-06-06 DIAGNOSIS — M1711 Unilateral primary osteoarthritis, right knee: Secondary | ICD-10-CM | POA: Diagnosis not present

## 2022-06-13 DIAGNOSIS — M1711 Unilateral primary osteoarthritis, right knee: Secondary | ICD-10-CM | POA: Diagnosis not present

## 2022-07-18 DIAGNOSIS — M810 Age-related osteoporosis without current pathological fracture: Secondary | ICD-10-CM | POA: Diagnosis not present

## 2022-07-18 DIAGNOSIS — I10 Essential (primary) hypertension: Secondary | ICD-10-CM | POA: Diagnosis not present

## 2022-07-25 DIAGNOSIS — M810 Age-related osteoporosis without current pathological fracture: Secondary | ICD-10-CM | POA: Diagnosis not present

## 2022-07-25 DIAGNOSIS — K219 Gastro-esophageal reflux disease without esophagitis: Secondary | ICD-10-CM | POA: Diagnosis not present

## 2022-07-25 DIAGNOSIS — E7800 Pure hypercholesterolemia, unspecified: Secondary | ICD-10-CM | POA: Diagnosis not present

## 2022-07-25 DIAGNOSIS — Z0000 Encounter for general adult medical examination without abnormal findings: Secondary | ICD-10-CM | POA: Diagnosis not present

## 2022-07-25 DIAGNOSIS — M19041 Primary osteoarthritis, right hand: Secondary | ICD-10-CM | POA: Diagnosis not present

## 2022-07-25 DIAGNOSIS — I10 Essential (primary) hypertension: Secondary | ICD-10-CM | POA: Diagnosis not present

## 2022-08-14 DIAGNOSIS — M62551 Muscle wasting and atrophy, not elsewhere classified, right thigh: Secondary | ICD-10-CM | POA: Diagnosis not present

## 2022-08-14 DIAGNOSIS — M25561 Pain in right knee: Secondary | ICD-10-CM | POA: Diagnosis not present

## 2022-08-14 DIAGNOSIS — R531 Weakness: Secondary | ICD-10-CM | POA: Diagnosis not present

## 2022-08-17 DIAGNOSIS — L03011 Cellulitis of right finger: Secondary | ICD-10-CM | POA: Diagnosis not present

## 2022-08-23 DIAGNOSIS — M62551 Muscle wasting and atrophy, not elsewhere classified, right thigh: Secondary | ICD-10-CM | POA: Diagnosis not present

## 2022-08-23 DIAGNOSIS — M25561 Pain in right knee: Secondary | ICD-10-CM | POA: Diagnosis not present

## 2022-08-23 DIAGNOSIS — R531 Weakness: Secondary | ICD-10-CM | POA: Diagnosis not present

## 2022-08-29 DIAGNOSIS — M62551 Muscle wasting and atrophy, not elsewhere classified, right thigh: Secondary | ICD-10-CM | POA: Diagnosis not present

## 2022-08-29 DIAGNOSIS — R531 Weakness: Secondary | ICD-10-CM | POA: Diagnosis not present

## 2022-08-29 DIAGNOSIS — M25561 Pain in right knee: Secondary | ICD-10-CM | POA: Diagnosis not present

## 2022-09-04 DIAGNOSIS — M25561 Pain in right knee: Secondary | ICD-10-CM | POA: Diagnosis not present

## 2022-09-04 DIAGNOSIS — R531 Weakness: Secondary | ICD-10-CM | POA: Diagnosis not present

## 2022-09-04 DIAGNOSIS — M62551 Muscle wasting and atrophy, not elsewhere classified, right thigh: Secondary | ICD-10-CM | POA: Diagnosis not present

## 2022-09-12 DIAGNOSIS — L814 Other melanin hyperpigmentation: Secondary | ICD-10-CM | POA: Diagnosis not present

## 2022-09-12 DIAGNOSIS — L821 Other seborrheic keratosis: Secondary | ICD-10-CM | POA: Diagnosis not present

## 2022-09-12 DIAGNOSIS — D225 Melanocytic nevi of trunk: Secondary | ICD-10-CM | POA: Diagnosis not present

## 2022-09-13 DIAGNOSIS — Z8551 Personal history of malignant neoplasm of bladder: Secondary | ICD-10-CM | POA: Diagnosis not present

## 2022-09-25 DIAGNOSIS — R531 Weakness: Secondary | ICD-10-CM | POA: Diagnosis not present

## 2022-09-25 DIAGNOSIS — M62551 Muscle wasting and atrophy, not elsewhere classified, right thigh: Secondary | ICD-10-CM | POA: Diagnosis not present

## 2022-09-25 DIAGNOSIS — M25561 Pain in right knee: Secondary | ICD-10-CM | POA: Diagnosis not present

## 2022-10-02 DIAGNOSIS — M25561 Pain in right knee: Secondary | ICD-10-CM | POA: Diagnosis not present

## 2022-10-02 DIAGNOSIS — R531 Weakness: Secondary | ICD-10-CM | POA: Diagnosis not present

## 2022-10-02 DIAGNOSIS — M62551 Muscle wasting and atrophy, not elsewhere classified, right thigh: Secondary | ICD-10-CM | POA: Diagnosis not present

## 2022-11-26 DIAGNOSIS — K080 Exfoliation of teeth due to systemic causes: Secondary | ICD-10-CM | POA: Diagnosis not present

## 2022-11-27 DIAGNOSIS — H524 Presbyopia: Secondary | ICD-10-CM | POA: Diagnosis not present

## 2022-12-19 DIAGNOSIS — M1711 Unilateral primary osteoarthritis, right knee: Secondary | ICD-10-CM | POA: Diagnosis not present

## 2022-12-26 DIAGNOSIS — M1711 Unilateral primary osteoarthritis, right knee: Secondary | ICD-10-CM | POA: Diagnosis not present

## 2023-01-02 DIAGNOSIS — M1711 Unilateral primary osteoarthritis, right knee: Secondary | ICD-10-CM | POA: Diagnosis not present

## 2023-01-16 DIAGNOSIS — R531 Weakness: Secondary | ICD-10-CM | POA: Diagnosis not present

## 2023-01-16 DIAGNOSIS — M62551 Muscle wasting and atrophy, not elsewhere classified, right thigh: Secondary | ICD-10-CM | POA: Diagnosis not present

## 2023-01-16 DIAGNOSIS — M25561 Pain in right knee: Secondary | ICD-10-CM | POA: Diagnosis not present

## 2023-02-27 DIAGNOSIS — M25561 Pain in right knee: Secondary | ICD-10-CM | POA: Diagnosis not present

## 2023-02-27 DIAGNOSIS — R531 Weakness: Secondary | ICD-10-CM | POA: Diagnosis not present

## 2023-02-27 DIAGNOSIS — M62551 Muscle wasting and atrophy, not elsewhere classified, right thigh: Secondary | ICD-10-CM | POA: Diagnosis not present

## 2023-03-15 DIAGNOSIS — M5451 Vertebrogenic low back pain: Secondary | ICD-10-CM | POA: Diagnosis not present

## 2023-03-29 DIAGNOSIS — M5451 Vertebrogenic low back pain: Secondary | ICD-10-CM | POA: Diagnosis not present

## 2023-04-05 DIAGNOSIS — M5451 Vertebrogenic low back pain: Secondary | ICD-10-CM | POA: Diagnosis not present

## 2023-04-12 DIAGNOSIS — M5451 Vertebrogenic low back pain: Secondary | ICD-10-CM | POA: Diagnosis not present

## 2023-04-17 DIAGNOSIS — M5451 Vertebrogenic low back pain: Secondary | ICD-10-CM | POA: Diagnosis not present

## 2023-04-24 DIAGNOSIS — K080 Exfoliation of teeth due to systemic causes: Secondary | ICD-10-CM | POA: Diagnosis not present

## 2023-04-26 DIAGNOSIS — M5451 Vertebrogenic low back pain: Secondary | ICD-10-CM | POA: Diagnosis not present

## 2023-05-01 DIAGNOSIS — K080 Exfoliation of teeth due to systemic causes: Secondary | ICD-10-CM | POA: Diagnosis not present

## 2023-05-24 DIAGNOSIS — S6391XA Sprain of unspecified part of right wrist and hand, initial encounter: Secondary | ICD-10-CM | POA: Diagnosis not present

## 2023-05-24 DIAGNOSIS — M19041 Primary osteoarthritis, right hand: Secondary | ICD-10-CM | POA: Diagnosis not present

## 2023-05-24 DIAGNOSIS — S66911A Strain of unspecified muscle, fascia and tendon at wrist and hand level, right hand, initial encounter: Secondary | ICD-10-CM | POA: Diagnosis not present

## 2023-05-24 DIAGNOSIS — M79641 Pain in right hand: Secondary | ICD-10-CM | POA: Diagnosis not present

## 2023-06-17 DIAGNOSIS — K080 Exfoliation of teeth due to systemic causes: Secondary | ICD-10-CM | POA: Diagnosis not present

## 2023-07-04 DIAGNOSIS — M1711 Unilateral primary osteoarthritis, right knee: Secondary | ICD-10-CM | POA: Diagnosis not present

## 2023-07-11 DIAGNOSIS — M1711 Unilateral primary osteoarthritis, right knee: Secondary | ICD-10-CM | POA: Diagnosis not present

## 2023-07-12 DIAGNOSIS — Z961 Presence of intraocular lens: Secondary | ICD-10-CM | POA: Diagnosis not present

## 2023-07-12 DIAGNOSIS — H04123 Dry eye syndrome of bilateral lacrimal glands: Secondary | ICD-10-CM | POA: Diagnosis not present

## 2023-07-18 DIAGNOSIS — M1711 Unilateral primary osteoarthritis, right knee: Secondary | ICD-10-CM | POA: Diagnosis not present

## 2023-07-24 DIAGNOSIS — E7800 Pure hypercholesterolemia, unspecified: Secondary | ICD-10-CM | POA: Diagnosis not present

## 2023-07-24 DIAGNOSIS — I10 Essential (primary) hypertension: Secondary | ICD-10-CM | POA: Diagnosis not present

## 2023-07-24 DIAGNOSIS — M810 Age-related osteoporosis without current pathological fracture: Secondary | ICD-10-CM | POA: Diagnosis not present

## 2023-07-30 DIAGNOSIS — I10 Essential (primary) hypertension: Secondary | ICD-10-CM | POA: Diagnosis not present

## 2023-07-30 DIAGNOSIS — M810 Age-related osteoporosis without current pathological fracture: Secondary | ICD-10-CM | POA: Diagnosis not present

## 2023-07-31 DIAGNOSIS — M810 Age-related osteoporosis without current pathological fracture: Secondary | ICD-10-CM | POA: Diagnosis not present

## 2023-07-31 DIAGNOSIS — F419 Anxiety disorder, unspecified: Secondary | ICD-10-CM | POA: Diagnosis not present

## 2023-07-31 DIAGNOSIS — Z791 Long term (current) use of non-steroidal anti-inflammatories (NSAID): Secondary | ICD-10-CM | POA: Diagnosis not present

## 2023-07-31 DIAGNOSIS — I10 Essential (primary) hypertension: Secondary | ICD-10-CM | POA: Diagnosis not present

## 2023-07-31 DIAGNOSIS — Z8551 Personal history of malignant neoplasm of bladder: Secondary | ICD-10-CM | POA: Diagnosis not present

## 2023-08-29 DIAGNOSIS — I10 Essential (primary) hypertension: Secondary | ICD-10-CM | POA: Diagnosis not present

## 2023-09-16 DIAGNOSIS — L728 Other follicular cysts of the skin and subcutaneous tissue: Secondary | ICD-10-CM | POA: Diagnosis not present

## 2023-09-16 DIAGNOSIS — D225 Melanocytic nevi of trunk: Secondary | ICD-10-CM | POA: Diagnosis not present

## 2023-09-16 DIAGNOSIS — L821 Other seborrheic keratosis: Secondary | ICD-10-CM | POA: Diagnosis not present

## 2023-09-16 DIAGNOSIS — L814 Other melanin hyperpigmentation: Secondary | ICD-10-CM | POA: Diagnosis not present

## 2023-09-18 DIAGNOSIS — Z8551 Personal history of malignant neoplasm of bladder: Secondary | ICD-10-CM | POA: Diagnosis not present

## 2023-09-18 DIAGNOSIS — N400 Enlarged prostate without lower urinary tract symptoms: Secondary | ICD-10-CM | POA: Diagnosis not present

## 2023-09-25 DIAGNOSIS — I10 Essential (primary) hypertension: Secondary | ICD-10-CM | POA: Diagnosis not present

## 2023-09-26 DIAGNOSIS — I10 Essential (primary) hypertension: Secondary | ICD-10-CM | POA: Diagnosis not present

## 2023-11-07 DIAGNOSIS — Z23 Encounter for immunization: Secondary | ICD-10-CM | POA: Diagnosis not present

## 2023-12-17 DIAGNOSIS — M25512 Pain in left shoulder: Secondary | ICD-10-CM | POA: Diagnosis not present

## 2023-12-17 DIAGNOSIS — M7552 Bursitis of left shoulder: Secondary | ICD-10-CM | POA: Diagnosis not present
